# Patient Record
Sex: Female | Born: 1950
Health system: Southern US, Community
[De-identification: ages and names within clinical notes are randomized; demographics above are authoritative.]

## PROBLEM LIST (undated history)

## (undated) DIAGNOSIS — I10 Essential (primary) hypertension: Secondary | ICD-10-CM

## (undated) DIAGNOSIS — E119 Type 2 diabetes mellitus without complications: Secondary | ICD-10-CM

## (undated) DIAGNOSIS — R011 Cardiac murmur, unspecified: Secondary | ICD-10-CM

## (undated) DIAGNOSIS — J449 Chronic obstructive pulmonary disease, unspecified: Secondary | ICD-10-CM

## (undated) DIAGNOSIS — I739 Peripheral vascular disease, unspecified: Secondary | ICD-10-CM

## (undated) DIAGNOSIS — I509 Heart failure, unspecified: Secondary | ICD-10-CM

## (undated) DIAGNOSIS — I779 Disorder of arteries and arterioles, unspecified: Secondary | ICD-10-CM

## (undated) DIAGNOSIS — Z87442 Personal history of urinary calculi: Secondary | ICD-10-CM

## (undated) DIAGNOSIS — R0600 Dyspnea, unspecified: Secondary | ICD-10-CM

## (undated) DIAGNOSIS — M1990 Unspecified osteoarthritis, unspecified site: Secondary | ICD-10-CM

## (undated) HISTORY — DX: Cardiac murmur, unspecified: R01.1

## (undated) HISTORY — PX: COLONOSCOPY: SHX174

## (undated) HISTORY — DX: Disorder of arteries and arterioles, unspecified: I77.9

## (undated) HISTORY — DX: Essential (primary) hypertension: I10

## (undated) HISTORY — DX: Chronic obstructive pulmonary disease, unspecified: J44.9

## (undated) HISTORY — PX: CATARACT EXTRACTION W/ INTRAOCULAR LENS  IMPLANT, BILATERAL: SHX1307

## (undated) HISTORY — DX: Peripheral vascular disease, unspecified: I73.9

## (undated) HISTORY — DX: Heart failure, unspecified: I50.9

---

## 1989-06-16 HISTORY — PX: ABDOMINAL HYSTERECTOMY: SHX81

## 1997-06-16 HISTORY — PX: CARDIAC CATHETERIZATION: SHX172

## 2002-08-22 ENCOUNTER — Emergency Department (HOSPITAL_COMMUNITY): Admission: EM | Admit: 2002-08-22 | Discharge: 2002-08-22 | Payer: Self-pay | Admitting: Emergency Medicine

## 2002-09-30 ENCOUNTER — Encounter: Payer: Self-pay | Admitting: Orthopedic Surgery

## 2002-09-30 ENCOUNTER — Encounter: Admission: RE | Admit: 2002-09-30 | Discharge: 2002-09-30 | Payer: Self-pay | Admitting: Orthopedic Surgery

## 2005-11-25 ENCOUNTER — Ambulatory Visit: Payer: Self-pay | Admitting: Internal Medicine

## 2006-12-29 ENCOUNTER — Emergency Department: Payer: Self-pay | Admitting: Emergency Medicine

## 2006-12-29 ENCOUNTER — Other Ambulatory Visit: Payer: Self-pay

## 2007-01-09 ENCOUNTER — Ambulatory Visit: Payer: Self-pay | Admitting: Anesthesiology

## 2007-06-14 ENCOUNTER — Emergency Department: Payer: Self-pay | Admitting: Emergency Medicine

## 2007-11-29 ENCOUNTER — Emergency Department: Payer: Self-pay | Admitting: Emergency Medicine

## 2008-05-18 ENCOUNTER — Ambulatory Visit: Payer: Self-pay | Admitting: Gastroenterology

## 2009-11-07 ENCOUNTER — Ambulatory Visit: Payer: Self-pay | Admitting: Internal Medicine

## 2010-04-03 ENCOUNTER — Ambulatory Visit: Payer: Self-pay | Admitting: Vascular Surgery

## 2010-04-10 ENCOUNTER — Ambulatory Visit: Payer: Self-pay | Admitting: Internal Medicine

## 2010-04-23 ENCOUNTER — Ambulatory Visit: Payer: Self-pay | Admitting: Cardiology

## 2010-05-08 ENCOUNTER — Ambulatory Visit: Payer: Self-pay | Admitting: Vascular Surgery

## 2010-05-10 LAB — PATHOLOGY REPORT

## 2013-02-08 ENCOUNTER — Ambulatory Visit: Payer: Self-pay | Admitting: Internal Medicine

## 2013-05-16 ENCOUNTER — Ambulatory Visit (INDEPENDENT_AMBULATORY_CARE_PROVIDER_SITE_OTHER): Payer: BC Managed Care – PPO

## 2013-05-16 ENCOUNTER — Ambulatory Visit (INDEPENDENT_AMBULATORY_CARE_PROVIDER_SITE_OTHER): Payer: BC Managed Care – PPO | Admitting: Podiatry

## 2013-05-16 ENCOUNTER — Encounter: Payer: Self-pay | Admitting: Podiatry

## 2013-05-16 VITALS — BP 126/69 | HR 89 | Resp 16 | Ht 64.0 in | Wt 260.0 lb

## 2013-05-16 DIAGNOSIS — M79671 Pain in right foot: Secondary | ICD-10-CM

## 2013-05-16 DIAGNOSIS — M722 Plantar fascial fibromatosis: Secondary | ICD-10-CM

## 2013-05-16 DIAGNOSIS — M79609 Pain in unspecified limb: Secondary | ICD-10-CM

## 2013-05-16 MED ORDER — METHYLPREDNISOLONE (PAK) 4 MG PO TABS: ORAL_TABLET | ORAL | Status: DC

## 2013-05-16 MED ORDER — MELOXICAM 15 MG PO TABS: 15.0000 mg | ORAL_TABLET | Freq: Every day | ORAL | Status: DC

## 2013-05-16 NOTE — Progress Notes (Signed)
Sarah [name] presents today as a 62 year old female with a chief complaint of right heel pain x1 month slowly seems to be getting worse is particularly bad in the mornings and after sitting. She's tried over-the-counter inserts with a pair of new tennis shoes. Per Dr. Andrey Cota that she has plantar fasciitis. She's tried no other conservative therapies.  Objective: Vital signs are stable she is alert and oriented x3. We have reviewed her past medical history medications and allergies. Review of systems is unremarkable. Pulses remain strongly palpable bilateral lower extremity. Capillary fill time to digits one through 5 of the bilateral foot is immediate. Logic sensorium per Semmes-Weinstein monofilament 1 through 5 bilateral is intact. Deep tendon reflexes are brisk equal symmetrical bilateral. Muscle strength +5 over 5 dorsiflexors plantar flexors inverters and evertors are all and all intrinsic musculature is intact. Orthopedic evaluation demonstrates all joints distal to the ankle a full range of motion without crepitus she has pain on palpation medial continued tubercle of the right heel. Radiographic evaluation demonstrates all joints distal to the ankle a full range of motion without crepitus. She has pain on palpation to the medial calcaneal tubercle of the right heel consistent with plantar fasciitis.  Assessment: Plantar fasciitis right.  Plan: Injected the right heel today with Kenalog and local anesthetic. Put her in a plantar fascial strapping. A night splint. Represcribed a steroid and nonsteroidal anti-inflammatories. Discussed appropriate shoe gear stretching exercises and ice therapy. We'll followup with her in one month.

## 2013-05-16 NOTE — Progress Notes (Signed)
N PAIN L RIGHT HEEL D 79M O SLOWLY C WORSE A SHOES, AFTER SITTING, AM BAD T OTC INSERTS,

## 2013-05-16 NOTE — Patient Instructions (Signed)
Plantar Fasciitis (Heel Spur Syndrome) with Rehab The plantar fascia is a fibrous, ligament-like, soft-tissue structure that spans the bottom of the foot. Plantar fasciitis is a condition that causes pain in the foot due to inflammation of the tissue. SYMPTOMS   Pain and tenderness on the underneath side of the foot.  Pain that worsens with standing or walking. CAUSES  Plantar fasciitis is caused by irritation and injury to the plantar fascia on the underneath side of the foot. Common mechanisms of injury include:  Direct trauma to bottom of the foot.  Damage to a small nerve that runs under the foot where the main fascia attaches to the heel bone.  Stress placed on the plantar fascia due to bone spurs. RISK INCREASES WITH:   Activities that place stress on the plantar fascia (running, jumping, pivoting, or cutting).  Poor strength and flexibility.  Improperly fitted shoes.  Tight calf muscles.  Flat feet.  Failure to warm-up properly before activity.  Obesity. PREVENTION  Warm up and stretch properly before activity.  Allow for adequate recovery between workouts.  Maintain physical fitness:  Strength, flexibility, and endurance.  Cardiovascular fitness.  Maintain a health body weight.  Avoid stress on the plantar fascia.  Wear properly fitted shoes, including arch supports for individuals who have flat feet. PROGNOSIS  If treated properly, then the symptoms of plantar fasciitis usually resolve without surgery. However, occasionally surgery is necessary. RELATED COMPLICATIONS   Recurrent symptoms that may result in a chronic condition.  Problems of the lower back that are caused by compensating for the injury, such as limping.  Pain or weakness of the foot during push-off following surgery.  Chronic inflammation, scarring, and partial or complete fascia tear, occurring more often from repeated injections. TREATMENT  Treatment initially involves the use of  ice and medication to help reduce pain and inflammation. The use of strengthening and stretching exercises may help reduce pain with activity, especially stretches of the Achilles tendon. These exercises may be performed at home or with a therapist. Your caregiver may recommend that you use heel cups of arch supports to help reduce stress on the plantar fascia. Occasionally, corticosteroid injections are given to reduce inflammation. If symptoms persist for greater than 6 months despite non-surgical (conservative), then surgery may be recommended.  MEDICATION   If pain medication is necessary, then nonsteroidal anti-inflammatory medications, such as aspirin and ibuprofen, or other minor pain relievers, such as acetaminophen, are often recommended.  Do not take pain medication within 7 days before surgery.  Prescription pain relievers may be given if deemed necessary by your caregiver. Use only as directed and only as much as you need.  Corticosteroid injections may be given by your caregiver. These injections should be reserved for the most serious cases, because they may only be given a certain number of times. HEAT AND COLD  Cold treatment (icing) relieves pain and reduces inflammation. Cold treatment should be applied for 10 to 15 minutes every 2 to 3 hours for inflammation and pain and immediately after any activity that aggravates your symptoms. Use ice packs or massage the area with a piece of ice (ice massage).  Heat treatment may be used prior to performing the stretching and strengthening activities prescribed by your caregiver, physical therapist, or athletic trainer. Use a heat pack or soak the injury in warm water. SEEK IMMEDIATE MEDICAL CARE IF:  Treatment seems to offer no benefit, or the condition worsens.  Any medications produce adverse side effects. EXERCISES RANGE   OF MOTION (ROM) AND STRETCHING EXERCISES - Plantar Fasciitis (Heel Spur Syndrome) These exercises may help you  when beginning to rehabilitate your injury. Your symptoms may resolve with or without further involvement from your physician, physical therapist or athletic trainer. While completing these exercises, remember:   Restoring tissue flexibility helps normal motion to return to the joints. This allows healthier, less painful movement and activity.  An effective stretch should be held for at least 30 seconds.  A stretch should never be painful. You should only feel a gentle lengthening or release in the stretched tissue. RANGE OF MOTION - Toe Extension, Flexion  Sit with your right / left leg crossed over your opposite knee.  Grasp your toes and gently pull them back toward the top of your foot. You should feel a stretch on the bottom of your toes and/or foot.  Hold this stretch for __________ seconds.  Now, gently pull your toes toward the bottom of your foot. You should feel a stretch on the top of your toes and or foot.  Hold this stretch for __________ seconds. Repeat __________ times. Complete this stretch __________ times per day.  RANGE OF MOTION - Ankle Dorsiflexion, Active Assisted  Remove shoes and sit on a chair that is preferably not on a carpeted surface.  Place right / left foot under knee. Extend your opposite leg for support.  Keeping your heel down, slide your right / left foot back toward the chair until you feel a stretch at your ankle or calf. If you do not feel a stretch, slide your bottom forward to the edge of the chair, while still keeping your heel down.  Hold this stretch for __________ seconds. Repeat __________ times. Complete this stretch __________ times per day.  STRETCH  Gastroc, Standing  Place hands on wall.  Extend right / left leg, keeping the front knee somewhat bent.  Slightly point your toes inward on your back foot.  Keeping your right / left heel on the floor and your knee straight, shift your weight toward the wall, not allowing your back to  arch.  You should feel a gentle stretch in the right / left calf. Hold this position for __________ seconds. Repeat __________ times. Complete this stretch __________ times per day. STRETCH  Soleus, Standing  Place hands on wall.  Extend right / left leg, keeping the other knee somewhat bent.  Slightly point your toes inward on your back foot.  Keep your right / left heel on the floor, bend your back knee, and slightly shift your weight over the back leg so that you feel a gentle stretch deep in your back calf.  Hold this position for __________ seconds. Repeat __________ times. Complete this stretch __________ times per day. STRETCH  Gastrocsoleus, Standing  Note: This exercise can place a lot of stress on your foot and ankle. Please complete this exercise only if specifically instructed by your caregiver.   Place the ball of your right / left foot on a step, keeping your other foot firmly on the same step.  Hold on to the wall or a rail for balance.  Slowly lift your other foot, allowing your body weight to press your heel down over the edge of the step.  You should feel a stretch in your right / left calf.  Hold this position for __________ seconds.  Repeat this exercise with a slight bend in your right / left knee. Repeat __________ times. Complete this stretch __________ times per day.    STRENGTHENING EXERCISES - Plantar Fasciitis (Heel Spur Syndrome)  These exercises may help you when beginning to rehabilitate your injury. They may resolve your symptoms with or without further involvement from your physician, physical therapist or athletic trainer. While completing these exercises, remember:   Muscles can gain both the endurance and the strength needed for everyday activities through controlled exercises.  Complete these exercises as instructed by your physician, physical therapist or athletic trainer. Progress the resistance and repetitions only as guided. STRENGTH - Towel  Curls  Sit in a chair positioned on a non-carpeted surface.  Place your foot on a towel, keeping your heel on the floor.  Pull the towel toward your heel by only curling your toes. Keep your heel on the floor.  If instructed by your physician, physical therapist or athletic trainer, add ____________________ at the end of the towel. Repeat __________ times. Complete this exercise __________ times per day. STRENGTH - Ankle Inversion  Secure one end of a rubber exercise band/tubing to a fixed object (table, pole). Loop the other end around your foot just before your toes.  Place your fists between your knees. This will focus your strengthening at your ankle.  Slowly, pull your big toe up and in, making sure the band/tubing is positioned to resist the entire motion.  Hold this position for __________ seconds.  Have your muscles resist the band/tubing as it slowly pulls your foot back to the starting position. Repeat __________ times. Complete this exercises __________ times per day.  Document Released: 06/02/2005 Document Revised: 08/25/2011 Document Reviewed: 09/14/2008 [employer] Patient Information 2014 [employer]. Plantar Fasciitis Plantar fasciitis is a common condition that causes foot pain. It is soreness (inflammation) of the band of tough fibrous tissue on the bottom of the foot that runs from the heel bone (calcaneus) to the ball of the foot. The cause of this soreness may be from excessive standing, poor fitting shoes, running on hard surfaces, being overweight, having an abnormal walk, or overuse (this is common in runners) of the painful foot or feet. It is also common in aerobic exercise dancers and ballet dancers. SYMPTOMS  Most people with plantar fasciitis complain of:  Severe pain in the morning on the bottom of their foot especially when taking the first steps out of bed. This pain recedes after a few minutes of walking.  Severe pain is experienced also during walking  following a long period of inactivity.  Pain is worse when walking barefoot or up stairs DIAGNOSIS   Your caregiver will diagnose this condition by examining and feeling your foot.  Special tests such as X-rays of your foot, are usually not needed. PREVENTION   Consult a sports medicine professional before beginning a new exercise program.  Walking programs offer a good workout. With walking there is a lower chance of overuse injuries common to runners. There is less impact and less jarring of the joints.  Begin all new exercise programs slowly. If problems or pain develop, decrease the amount of time or distance until you are at a comfortable level.  Wear good shoes and replace them regularly.  Stretch your foot and the heel cords at the back of the ankle (Achilles tendon) both before and after exercise.  Run or exercise on even surfaces that are not hard. For example, asphalt is better than pavement.  Do not run barefoot on hard surfaces.  If using a treadmill, vary the incline.  Do not continue to workout if you have foot or joint   problems. Seek professional help if they do not improve. HOME CARE INSTRUCTIONS   Avoid activities that cause you pain until you recover.  Use ice or cold packs on the problem or painful areas after working out.  Only take over-the-counter or prescription medicines for pain, discomfort, or fever as directed by your caregiver.  Soft shoe inserts or athletic shoes with air or gel sole cushions may be helpful.  If problems continue or become more severe, consult a sports medicine caregiver or your own health care provider. Cortisone is a potent anti-inflammatory medication that may be injected into the painful area. You can discuss this treatment with your caregiver. MAKE SURE YOU:   Understand these instructions.  Will watch your condition.  Will get help right away if you are not doing well or get worse. Document Released: 02/25/2001 Document  Revised: 08/25/2011 Document Reviewed: 04/26/2008 [employer] Patient Information 2014 [employer].  

## 2013-06-13 ENCOUNTER — Ambulatory Visit: Payer: BC Managed Care – PPO | Admitting: Podiatry

## 2013-06-20 ENCOUNTER — Ambulatory Visit: Payer: Self-pay | Admitting: Podiatry

## 2013-09-09 ENCOUNTER — Other Ambulatory Visit: Payer: Self-pay | Admitting: *Deleted

## 2013-09-09 MED ORDER — MELOXICAM 15 MG PO TABS: 15.0000 mg | ORAL_TABLET | Freq: Every day | ORAL | Status: DC

## 2013-09-09 NOTE — Telephone Encounter (Signed)
REFILL REQUEST FOR MOBIC.

## 2014-02-15 ENCOUNTER — Other Ambulatory Visit: Payer: Self-pay | Admitting: *Deleted

## 2014-02-15 MED ORDER — MELOXICAM 15 MG PO TABS: 15.0000 mg | ORAL_TABLET | Freq: Every day | ORAL | Status: DC

## 2014-02-15 NOTE — Telephone Encounter (Signed)
[facility] sent fax refill request for meloxicam 15 mg #30 take one by mouth once daily. Per dr Al Corpus refill with 3 refills.

## 2014-03-14 ENCOUNTER — Other Ambulatory Visit (HOSPITAL_COMMUNITY): Payer: Self-pay | Admitting: *Deleted

## 2014-03-14 DIAGNOSIS — I509 Heart failure, unspecified: Secondary | ICD-10-CM

## 2014-03-28 ENCOUNTER — Other Ambulatory Visit: Payer: Self-pay

## 2014-03-28 ENCOUNTER — Other Ambulatory Visit (INDEPENDENT_AMBULATORY_CARE_PROVIDER_SITE_OTHER): Payer: BC Managed Care – PPO

## 2014-03-28 DIAGNOSIS — I509 Heart failure, unspecified: Secondary | ICD-10-CM

## 2014-03-28 DIAGNOSIS — R579 Shock, unspecified: Secondary | ICD-10-CM

## 2014-03-28 DIAGNOSIS — R601 Generalized edema: Secondary | ICD-10-CM

## 2014-05-01 ENCOUNTER — Ambulatory Visit: Payer: Self-pay | Admitting: Internal Medicine

## 2014-05-04 LAB — PULMONARY FUNCTION TEST

## 2014-06-05 ENCOUNTER — Encounter: Payer: Self-pay | Admitting: Internal Medicine

## 2014-06-05 ENCOUNTER — Ambulatory Visit (INDEPENDENT_AMBULATORY_CARE_PROVIDER_SITE_OTHER): Payer: BC Managed Care – PPO | Admitting: Internal Medicine

## 2014-06-05 ENCOUNTER — Telehealth: Payer: Self-pay | Admitting: *Deleted

## 2014-06-05 VITALS — BP 140/88 | HR 75 | Temp 97.8°F | Ht 64.5 in | Wt 268.0 lb

## 2014-06-05 DIAGNOSIS — R0600 Dyspnea, unspecified: Secondary | ICD-10-CM | POA: Insufficient documentation

## 2014-06-05 DIAGNOSIS — R0609 Other forms of dyspnea: Secondary | ICD-10-CM

## 2014-06-05 DIAGNOSIS — J439 Emphysema, unspecified: Secondary | ICD-10-CM

## 2014-06-05 DIAGNOSIS — E669 Obesity, unspecified: Secondary | ICD-10-CM

## 2014-06-05 MED ORDER — TIOTROPIUM BROMIDE MONOHYDRATE 18 MCG IN CAPS: 18.0000 ug | ORAL_CAPSULE | Freq: Every day | RESPIRATORY_TRACT | Status: DC

## 2014-06-05 NOTE — Telephone Encounter (Signed)
Patient called back to see if Albuterol inhaler was to be called in as well. She picked up the Spiriva inhaler.

## 2014-06-05 NOTE — Progress Notes (Signed)
Date: 06/05/2014  MRN# [id number] Sarah [name] Feb 27, 1951  Referring Physician:   MAYUMI [name] is a 63 y.o. old female seen in consultation for shortness of breath  CC:  Chief Complaint  Patient presents with  . Advice Only    pt c/o sob with exhertion. She says it has gotten a little better. Pt denies cough,wheeze and chest tightness.    HPI:  63 yo female with PMHx HTN, HLD, referred by Dr. [name] for evaluation of dyspnea on exertion.  Patient is accompanied today with her longtime boyfriend History of her DOE is as follows - noticed mostly with sexual activity  - can climb a flight of stair, but gets sob short afterwards - lifting heavy object may cause some sob - associated with mild intermittent cough (non productive).  - has been slowly present for about 2 years but more noticeable over the past 3 months.  Patient has a hx of smoking, 0.5 ppd x 27years, but quit in 1997.  She was diagnosed with mild diastolic congestive heart failure in 1999 and intermittently followed with Dr. [name] for this.  Her heart failure remained stable, and her primary care physician has been following her along for this. Given the sob\doe, her PMD obtained pulmonary function testing and then referred her to Pulmonary for further evaluation.  She has noticed ankle swelling mainly in the right foot over the past couple of weeks, but currently on lasix, has a history right plantar fascitis, and some ch9ornic right swelling since she was diagnosed with CHF.  She denies ever being intubated for any respiratory, no history of bronchitis or requiring steroid\antibiotics for upper respiratory tract infection in the last 2-3 years.  She denies any other respiratory illness.  Patient did have pulmonary function testing and would like to review the results today.    PMHX:   Past Medical History  Diagnosis Date  . Congestive heart failure   . Carotid artery disease   . HBP (high blood pressure)   .  Heart murmur    Surgical Hx:  Past Surgical History  Procedure Laterality Date  . Abdominal hysterectomy  1991   Family Hx:  Family History  Problem Relation Age of Onset  . Stroke Father   . Hypertension Father   . Hypertension Mother   . Stroke Mother    Social Hx:   History  Substance Use Topics  . Smoking status: Former Smoker -- 0.50 packs/day for 27 years    Types: Cigarettes    Quit date: 01/15/1996  . Smokeless tobacco: Never Used  . Alcohol Use: 0.0 oz/week    0 Not specified per week     Comment: WINE WEEKLY   Medication:   Current Outpatient Rx  Name  Route  Sig  Dispense  Refill  . acetaminophen (TYLENOL) 500 MG tablet   Oral   Take 500 mg by mouth as needed.         Marland Kitchen aspirin 81 MG tablet   Oral   Take 81 mg by mouth daily.         . carvedilol (COREG) 12.5 MG tablet   Oral   Take 12.5 mg by mouth 2 (two) times daily with a meal.         . cyclobenzaprine (FLEXERIL) 10 MG tablet   Oral   Take 10 mg by mouth at bedtime.         . furosemide (LASIX) 20 MG tablet   Oral   Take  20 mg by mouth daily.         Marland Kitchen. ibuprofen (ADVIL,MOTRIN) 800 MG tablet   Oral   Take 800 mg by mouth as needed.         Marland Kitchen. losartan (COZAAR) 50 MG tablet   Oral   Take 50 mg by mouth daily.         . Multiple Vitamins-Minerals (CENTRUM PO)   Oral   Take by mouth daily.         Marland Kitchen. omeprazole (PRILOSEC) 20 MG capsule   Oral   Take 20 mg by mouth 2 (two) times daily before a meal.         . simvastatin (ZOCOR) 40 MG tablet   Oral   Take 40 mg by mouth daily.         Marland Kitchen. albuterol (PROVENTIL HFA;VENTOLIN HFA) 108 (90 BASE) MCG/ACT inhaler      2 puffs every 2-4 hours prn   1 Inhaler   2   . tiotropium (SPIRIVA) 18 MCG inhalation capsule   Inhalation   Place 1 capsule (18 mcg total) into inhaler and inhale daily.   30 capsule   5       Allergies:  Review of patient's allergies indicates no known allergies.  Review of Systems: Gen:   Denies  fever, sweats, chills HEENT: Denies blurred vision, double vision, ear pain, eye pain, hearing loss, nose bleeds, sore throat Cvc:  No dizziness, chest pain or heaviness Resp:   Denies cough or sputum porduction, shortness of breath Gi: Denies swallowing difficulty, stomach pain, nausea or vomiting, diarrhea, constipation, bowel incontinence Gu:  Denies bladder incontinence, burning urine Ext:   No Joint pain.  Admits to mild right foot swelling, this is chronic Skin: No skin rash, easy bruising or bleeding or hives Endoc:  No polyuria, polydipsia , polyphagia or weight change Psych: No depression, insomnia or hallucinations  Other:  All other systems negative  Physical Examination:   VS: BP 140/88 mmHg  Pulse 75  Temp(Src) 97.8 F (36.6 C) (Oral)  Ht 5' 4.5" (1.638 m)  Wt 268 lb (121.564 kg)  BMI 45.31 kg/m2  SpO2 98%  General Appearance: No distress  Neuro:without focal findings, mental status, speech normal, alert and oriented, cranial nerves 2-12 intact, reflexes normal and symmetric, sensation grossly normal  HEENT: PERRLA, EOM intact, no ptosis, no other lesions noticed; Mallampati 2 Pulmonary: good airway entry, mild dec BS at the bases, no wheezes\ronchi.  Sputum Production:  none CardiovascularNormal S1,S2.  No m/r/g.  Abdominal aorta pulsation normal.    Abdomen: Benign, Soft, non-tender, No masses, hepatosplenomegaly, No lymphadenopathy Renal:  No costovertebral tenderness  GU:  No performed at this time. Endoc: No evident thyromegaly, no signs of acromegaly or Cushing features Skin:   warm, no rashes, no ecchymosis  Extremities: normal, no cyanosis, clubbing,  warm with normal capillary refill. Other findings: mild trace ankle edema   Labs results:   Rad results:  (The following images and results were reviewed by Dr. Dema SeverinMungal).   ECHO 03/2014 Study Conclusions - Left ventricle: The cavity size was normal. There was mild concentric hypertrophy. Systolic  function was vigorous. The estimated ejection fraction was in the range of 65% to 70%. Wall motion was normal; there were no regional wall motion abnormalities. Doppler parameters are consistent with abnormal left ventricular relaxation (grade 1 diastolic dysfunction). - Left atrium: The atrium was mildly dilated. - Pulmonary arteries: Systolic pressure was within the normal range.  PFTs 04/2014  Spirometry: FVC: 2.27L 78%predicted FEV1: 1.44L 68%predicted FEV1/FVC: 64%predicted.   Diffusion: 57%predicted.   Lung Volumes: VC: 2.32L 80%predicted RV: 1.62L 91%predicted TLC: 3.94L 84%predicted.   Volume/Flow Loops: scooping of expiratory limb.   Comments: Spirometric Data is Acceptable and Reproducible Fev1/Fvc ratio and DLCO reduced.   Assessment/Impression: Moderate Obstructive Airways disease with Diffusion impairment  Clinical Correlation Advised.    Assessment and Plan: DOE (dyspnea on exertion) Multifactorial - dCHF, COPD, Obestiy  Plan - cont with CHF optimization, follow up with cardiology - plan to initiate spiriva - wt. Loss and proper diet education given to patient. \ - OSA Screen at next visit  COPD type A COPD Type A Level of obstruction - Moderate Symptoms - mild  Plan - given her mild symptoms (DOE), will plan for Spiriva and prn albuterol - patient educated on use and administration of inhaler, along with the side effects - if no improvement or worsening symptoms, will plan to add Advair - CAT, mMRC at next visit.   Obesity OBESITY  Wt: 268 lbs BMI: 45 Discussed importance of weight reduction.  Educated regarding limitation of  intake of greasy/fried foods.  Instructed on benefit of  a low-impact exercise program, starting slowly.  Discussed benefits of 30-45 minutes of some form of exercise daily as well as benefit of supervised exercise program.       Updated Medication List Outpatient Encounter Prescriptions as of 06/05/2014   Medication Sig  . acetaminophen (TYLENOL) 500 MG tablet Take 500 mg by mouth as needed.  Marland Kitchen. aspirin 81 MG tablet Take 81 mg by mouth daily.  . carvedilol (COREG) 12.5 MG tablet Take 12.5 mg by mouth 2 (two) times daily with a meal.  . cyclobenzaprine (FLEXERIL) 10 MG tablet Take 10 mg by mouth at bedtime.  . furosemide (LASIX) 20 MG tablet Take 20 mg by mouth daily.  Marland Kitchen. ibuprofen (ADVIL,MOTRIN) 800 MG tablet Take 800 mg by mouth as needed.  Marland Kitchen. losartan (COZAAR) 50 MG tablet Take 50 mg by mouth daily.  . Multiple Vitamins-Minerals (CENTRUM PO) Take by mouth daily.  Marland Kitchen. omeprazole (PRILOSEC) 20 MG capsule Take 20 mg by mouth 2 (two) times daily before a meal.  . simvastatin (ZOCOR) 40 MG tablet Take 40 mg by mouth daily.  . [DISCONTINUED] meloxicam (MOBIC) 15 MG tablet Take 1 tablet (15 mg total) by mouth daily. (Patient not taking: Reported on 06/05/2014)  . [DISCONTINUED] methylPREDNIsolone (MEDROL DOSPACK) 4 MG tablet follow package directions (Patient not taking: Reported on 06/05/2014)    Orders for this visit: No orders of the defined types were placed in this encounter.     Thank  you for the consultation and for allowing [facility] to assist in the care of your patient. Our recommendations are noted above.  Please contact us if we can be of further service.   Stephanie AcreVishal Legacy Carrender, MD [facility] Office Number: [phone]

## 2014-06-05 NOTE — Patient Instructions (Signed)
Spiriva will be sent to your pharmacy. Follow up with Dr. Dema SeverinMungal in 3 months.

## 2014-06-05 NOTE — Assessment & Plan Note (Addendum)
Multifactorial - dCHF, COPD, Obestiy  Plan - cont with CHF optimization, follow up with cardiology - plan to initiate spiriva - wt. Loss and proper diet education given to patient. \ - OSA Screen at next visit

## 2014-06-06 ENCOUNTER — Telehealth: Payer: Self-pay | Admitting: Internal Medicine

## 2014-06-06 ENCOUNTER — Other Ambulatory Visit: Payer: Self-pay | Admitting: Internal Medicine

## 2014-06-06 MED ORDER — ALBUTEROL SULFATE HFA 108 (90 BASE) MCG/ACT IN AERS: INHALATION_SPRAY | RESPIRATORY_TRACT | Status: DC

## 2014-06-06 NOTE — Assessment & Plan Note (Signed)
OBESITY  Wt: 268 lbs BMI: 45 Discussed importance of weight reduction.  Educated regarding limitation of  intake of greasy/fried foods.  Instructed on benefit of  a low-impact exercise program, starting slowly.  Discussed benefits of 30-45 minutes of some form of exercise daily as well as benefit of supervised exercise program.

## 2014-06-06 NOTE — Assessment & Plan Note (Signed)
COPD Type A Level of obstruction - Moderate Symptoms - mild  Plan - given her mild symptoms (DOE), will plan for Spiriva and prn albuterol - patient educated on use and administration of inhaler, along with the side effects - if no improvement or worsening symptoms, will plan to add Advair - CAT, mMRC at next visit.

## 2014-06-06 NOTE — Telephone Encounter (Signed)
She does need a rescue inhaler also. Please inform patient and call in an rx Alburterol 2puff q2-4hrs PRN sob\wheezing  Thank you

## 2014-06-06 NOTE — Telephone Encounter (Signed)
Spoke with pharmacy and gave ok to change Ventolin to ProAir.

## 2014-07-17 ENCOUNTER — Other Ambulatory Visit: Payer: Self-pay | Admitting: Radiology

## 2014-07-17 DIAGNOSIS — R0602 Shortness of breath: Secondary | ICD-10-CM

## 2014-08-01 ENCOUNTER — Encounter: Payer: Self-pay | Admitting: *Deleted

## 2014-08-01 ENCOUNTER — Other Ambulatory Visit: Payer: BLUE CROSS/BLUE SHIELD

## 2014-08-01 DIAGNOSIS — R0989 Other specified symptoms and signs involving the circulatory and respiratory systems: Secondary | ICD-10-CM

## 2014-08-07 ENCOUNTER — Ambulatory Visit: Payer: Self-pay | Admitting: Internal Medicine

## 2014-08-18 ENCOUNTER — Ambulatory Visit: Payer: Self-pay | Admitting: Gastroenterology

## 2014-08-28 ENCOUNTER — Other Ambulatory Visit (INDEPENDENT_AMBULATORY_CARE_PROVIDER_SITE_OTHER): Payer: BLUE CROSS/BLUE SHIELD

## 2014-08-28 ENCOUNTER — Other Ambulatory Visit: Payer: Self-pay

## 2014-08-28 DIAGNOSIS — R0602 Shortness of breath: Secondary | ICD-10-CM

## 2014-08-28 DIAGNOSIS — R0600 Dyspnea, unspecified: Secondary | ICD-10-CM

## 2014-10-06 ENCOUNTER — Ambulatory Visit
Admit: 2014-10-06 | Disposition: A | Discharge status: Home / Self Care | Payer: Self-pay | Attending: Internal Medicine | Admitting: Internal Medicine

## 2014-10-10 ENCOUNTER — Ambulatory Visit: Payer: BLUE CROSS/BLUE SHIELD | Admitting: Internal Medicine

## 2014-11-01 ENCOUNTER — Ambulatory Visit (INDEPENDENT_AMBULATORY_CARE_PROVIDER_SITE_OTHER): Payer: BLUE CROSS/BLUE SHIELD | Admitting: Internal Medicine

## 2014-11-01 ENCOUNTER — Encounter: Payer: Self-pay | Admitting: Internal Medicine

## 2014-11-01 VITALS — BP 108/68 | HR 80 | Temp 97.8°F | Ht 64.5 in | Wt 267.0 lb

## 2014-11-01 DIAGNOSIS — J439 Emphysema, unspecified: Secondary | ICD-10-CM | POA: Diagnosis not present

## 2014-11-01 DIAGNOSIS — R0600 Dyspnea, unspecified: Secondary | ICD-10-CM

## 2014-11-01 DIAGNOSIS — E669 Obesity, unspecified: Secondary | ICD-10-CM

## 2014-11-01 DIAGNOSIS — R0609 Other forms of dyspnea: Secondary | ICD-10-CM | POA: Diagnosis not present

## 2014-11-01 NOTE — Progress Notes (Signed)
MRN# [id number] Sarah [name] Nov 25, 1950   CC: Chief Complaint  Patient presents with  . Follow-up    Pt here to f/u PH and dyspnea      Brief History: HPI 05/2014 64 yo female with PMHx HTN, HLD, referred by Dr. [name] for evaluation of dyspnea on exertion. Patient is accompanied today with her longtime boyfriend History of her DOE is as follows - noticed mostly with sexual activity  - can climb a flight of stair, but gets sob short afterwards - lifting heavy object may cause some sob - associated with mild intermittent cough (non productive).  - has been slowly present for about 2 years but more noticeable over the past 3 months.  Patient has a hx of smoking, 0.5 ppd x 27years, but quit in 1997. She was diagnosed with mild diastolic congestive heart failure in 1999 and intermittently followed with Dr. [name] for this. Her heart failure remained stable, and her primary care physician has been following her along for this. Given the sob\doe, her PMD obtained pulmonary function testing and then referred her to Pulmonary for further evaluation.  She has noticed ankle swelling mainly in the right foot over the past couple of weeks, but currently on lasix, has a history right plantar fascitis, and some ch9ornic right swelling since she was diagnosed with CHF.  She denies ever being intubated for any respiratory, no history of bronchitis or requiring steroid\antibiotics for upper respiratory tract infection in the last 2-3 years. She denies any other respiratory illness.  Patient did have pulmonary function testing and would like to review the results today.  Plan - wt loss, spiriva, chf optimization  Events since last clinic visit: Presents today for a follow up visit.  Currently doing well since starting spiriva. Can now walk further, started exercising, walking about 1 mile a day on the track. Performing treadmill exercises in the AM and 15 mins in the PM. DOE has  significantly improved.   No recent URI, no recent ED\Urgent care visits.     Medication:   Current Outpatient Rx  Name  Route  Sig  Dispense  Refill  . acetaminophen (TYLENOL) 500 MG tablet   Oral   Take 500 mg by mouth as needed.         Marland Kitchen albuterol (PROVENTIL HFA;VENTOLIN HFA) 108 (90 BASE) MCG/ACT inhaler      2 puffs every 2-4 hours prn   1 Inhaler   2   . aspirin 81 MG tablet   Oral   Take 81 mg by mouth daily.         . carvedilol (COREG) 12.5 MG tablet   Oral   Take 12.5 mg by mouth 2 (two) times daily with a meal.         . cyclobenzaprine (FLEXERIL) 10 MG tablet   Oral   Take 10 mg by mouth at bedtime.         . furosemide (LASIX) 20 MG tablet   Oral   Take 20 mg by mouth daily.         Marland Kitchen ibuprofen (ADVIL,MOTRIN) 800 MG tablet   Oral   Take 800 mg by mouth as needed.         Marland Kitchen losartan (COZAAR) 50 MG tablet   Oral   Take 50 mg by mouth daily.         . Multiple Vitamins-Minerals (CENTRUM PO)   Oral   Take by mouth daily.         Marland Kitchen  naproxen (NAPROSYN) 375 MG tablet   Oral   Take 375 mg by mouth 3 (three) times daily as needed.      2   . omeprazole (PRILOSEC) 20 MG capsule   Oral   Take 20 mg by mouth daily.          . simvastatin (ZOCOR) 40 MG tablet   Oral   Take 40 mg by mouth daily.         Marland Kitchen. tiotropium (SPIRIVA) 18 MCG inhalation capsule   Inhalation   Place 1 capsule (18 mcg total) into inhaler and inhale daily.   30 capsule   5      Review of Systems: Gen:  Denies  fever, sweats, chills HEENT: Denies blurred vision, double vision, ear pain, eye pain, hearing loss, nose bleeds, sore throat Cvc:  No dizziness, chest pain or heaviness Resp:   Admits to: Gi: Denies swallowing difficulty, stomach pain, nausea or vomiting, diarrhea, constipation, bowel incontinence Gu:  Denies bladder incontinence, burning urine Ext:   No Joint pain, stiffness or swelling Skin: No skin rash, easy bruising or bleeding or  hives Endoc:  No polyuria, polydipsia , polyphagia or weight change Other:  All other systems negative  Allergies:  Review of patient's allergies indicates no known allergies.  Physical Examination:  VS: BP 108/68 mmHg  Pulse 80  Temp(Src) 97.8 F (36.6 C) (Oral)  Ht 5' 4.5" (1.638 m)  Wt 267 lb (121.11 kg)  BMI 45.14 kg/m2  SpO2 96%  General Appearance: No distress  HEENT: PERRLA, no ptosis, no other lesions noticed Pulmonary:normal breath sounds., diaphragmatic excursion normal.No wheezing, No rales   Cardiovascular:  Normal S1,S2.  No m/r/g.     Abdomen:Exam: Benign, Soft, non-tender, No masses  Skin:   warm, no rashes, no ecchymosis  Extremities: normal, no cyanosis, clubbing, warm with normal capillary refill.     Assessment and Plan: COPD type A COPD Type A Level of obstruction - Moderate Symptoms - mild  Plan - continue with Spiriva  - patient educated on use and administration of inhaler, along with the side effects - if no improvement or worsening symptoms, will plan to add Advair     DOE (dyspnea on exertion) Multifactorial - dCHF, COPD, Obestiy, denconditioning  Plan - cont with CHF optimization, follow up with cardiology - Continue with Spiriva - wt. Loss and proper diet education given to patient.  - continue with exercise - OSA Screen at next visit     Obesity OBESITY  Wt: 267 lbs BMI: 45 Discussed importance of weight reduction.  Educated regarding limitation of  intake of greasy/fried foods.  Instructed on benefit of  a low-impact exercise program, starting slowly.  Discussed benefits of 30-45 minutes of some form of exercise daily as well as benefit of supervised exercise program.          Updated Medication List Outpatient Encounter Prescriptions as of 11/01/2014  Medication Sig  . acetaminophen (TYLENOL) 500 MG tablet Take 500 mg by mouth as needed.  Marland Kitchen. albuterol (PROVENTIL HFA;VENTOLIN HFA) 108 (90 BASE) MCG/ACT inhaler 2 puffs  every 2-4 hours prn  . aspirin 81 MG tablet Take 81 mg by mouth daily.  . carvedilol (COREG) 12.5 MG tablet Take 12.5 mg by mouth 2 (two) times daily with a meal.  . cyclobenzaprine (FLEXERIL) 10 MG tablet Take 10 mg by mouth at bedtime.  . furosemide (LASIX) 20 MG tablet Take 20 mg by mouth daily.  Marland Kitchen. ibuprofen (ADVIL,MOTRIN) 800 MG  tablet Take 800 mg by mouth as needed.  Marland Kitchen. losartan (COZAAR) 50 MG tablet Take 50 mg by mouth daily.  . Multiple Vitamins-Minerals (CENTRUM PO) Take by mouth daily.  . naproxen (NAPROSYN) 375 MG tablet Take 375 mg by mouth 3 (three) times daily as needed.  Marland Kitchen. omeprazole (PRILOSEC) 20 MG capsule Take 20 mg by mouth daily.   . simvastatin (ZOCOR) 40 MG tablet Take 40 mg by mouth daily.  Marland Kitchen. tiotropium (SPIRIVA) 18 MCG inhalation capsule Place 1 capsule (18 mcg total) into inhaler and inhale daily.   No facility-administered encounter medications on file as of 11/01/2014.    Orders for this visit: No orders of the defined types were placed in this encounter.    Thank  you for the visitation and for allowing  [facility] to assist in the care of your patient. Our recommendations are noted above.  Please contact us if we can be of further service.  Stephanie AcreVishal Santo Zahradnik, MD [facility] Office Number: [phone]

## 2014-11-01 NOTE — Assessment & Plan Note (Addendum)
Multifactorial - dCHF, COPD, Obestiy, denconditioning  Plan - cont with CHF optimization, follow up with cardiology - Continue with Spiriva - wt. Loss and proper diet education given to patient.  - continue with exercise - OSA Screen at next visit

## 2014-11-01 NOTE — Patient Instructions (Signed)
Follow up with Dr. Dema SeverinMungal in 6 months - cont with Spiriva  - cont with Diet, exercise and weight loss.

## 2014-11-01 NOTE — Assessment & Plan Note (Signed)
COPD Type A Level of obstruction - Moderate Symptoms - mild  Plan - continue with Spiriva  - patient educated on use and administration of inhaler, along with the side effects - if no improvement or worsening symptoms, will plan to add Advair      

## 2014-11-01 NOTE — Assessment & Plan Note (Addendum)
OBESITY  Wt: 267 lbs BMI: 45 Discussed importance of weight reduction.  Educated regarding limitation of  intake of greasy/fried foods.  Instructed on benefit of  a low-impact exercise program, starting slowly.  Discussed benefits of 30-45 minutes of some form of exercise daily as well as benefit of supervised exercise program.

## 2014-12-14 ENCOUNTER — Ambulatory Visit (INDEPENDENT_AMBULATORY_CARE_PROVIDER_SITE_OTHER): Payer: BLUE CROSS/BLUE SHIELD | Admitting: General Surgery

## 2014-12-14 ENCOUNTER — Encounter: Payer: Self-pay | Admitting: General Surgery

## 2014-12-14 VITALS — BP 130/73 | HR 76 | Temp 98.7°F | Resp 12 | Ht 63.0 in | Wt 263.0 lb

## 2014-12-14 DIAGNOSIS — L0231 Cutaneous abscess of buttock: Secondary | ICD-10-CM

## 2014-12-14 NOTE — Progress Notes (Signed)
Patient ID: Sarah [name], female   DOB: [birth year], 64 y.o.   MRN: [id number]  Chief Complaint  Patient presents with  . Other    buttock abscess    HPI Sarah [name] is a 64 y.o. female here today for a evaluation of a left buttock abscess. Patient states she noticed the area about one week ago. The area is red and swollen, she states the area has started draining some in the last three days.  Patient reports that she's been feeling poorly last by 4 hours with some nausea. No fever or chills reported. HPI  Past Medical History  Diagnosis Date  . Congestive heart failure   . Carotid artery disease   . HBP (high blood pressure)   . Heart murmur   . COPD (chronic obstructive pulmonary disease)     Past Surgical History  Procedure Laterality Date  . Abdominal hysterectomy  1991  . Colonoscopy    . Cardiac catheterization  1999    Family History  Problem Relation Age of Onset  . Stroke Father   . Hypertension Father   . Hypertension Mother   . Stroke Mother     Social History History  Substance Use Topics  . Smoking status: Former Smoker -- 0.50 packs/day for 27 years    Types: Cigarettes    Quit date: 01/15/1996  . Smokeless tobacco: Never Used  . Alcohol Use: 0.0 oz/week    0 Standard drinks or equivalent per week     Comment: WINE WEEKLY    No Known Allergies  Current Outpatient Prescriptions  Medication Sig Dispense Refill  . acetaminophen (TYLENOL) 500 MG tablet Take 500 mg by mouth as needed.    Marland Kitchen. albuterol (PROVENTIL HFA;VENTOLIN HFA) 108 (90 BASE) MCG/ACT inhaler 2 puffs every 2-4 hours prn 1 Inhaler 2  . aspirin 81 MG tablet Take 81 mg by mouth daily.    . carvedilol (COREG) 12.5 MG tablet Take 12.5 mg by mouth 2 (two) times daily with a meal.    . furosemide (LASIX) 20 MG tablet Take 20 mg by mouth daily.    Marland Kitchen. ibuprofen (ADVIL,MOTRIN) 800 MG tablet Take 800 mg by mouth as needed.    Marland Kitchen. losartan (COZAAR) 50 MG tablet Take 50 mg by mouth daily.    .  metFORMIN (GLUCOPHAGE) 500 MG tablet   0  . Multiple Vitamins-Minerals (CENTRUM PO) Take by mouth daily.    . naproxen (NAPROSYN) 375 MG tablet Take 375 mg by mouth 3 (three) times daily as needed.  2  . omeprazole (PRILOSEC) 20 MG capsule Take 20 mg by mouth daily.     . simvastatin (ZOCOR) 40 MG tablet Take 40 mg by mouth daily.    Marland Kitchen. sulfamethoxazole-trimethoprim (BACTRIM DS,SEPTRA DS) 800-160 MG per tablet   0  . tiotropium (SPIRIVA) 18 MCG inhalation capsule Place 1 capsule (18 mcg total) into inhaler and inhale daily. 30 capsule 5   No current facility-administered medications for this visit.    Review of Systems Review of Systems  Constitutional: Negative.   Respiratory: Negative.   Cardiovascular: Negative.   Gastrointestinal: Positive for nausea. Negative for vomiting, diarrhea and constipation.    Blood pressure 130/73, pulse 76, temperature 98.7 F (37.1 C), resp. rate 12, height 5\' 3"  (1.6 m), weight 263 lb (119.296 kg).  Physical Exam Physical Exam  Constitutional: She is oriented to person, place, and time. She appears well-nourished.  HENT:  Mouth/Throat: Oropharynx is clear and moist.  Eyes: Conjunctivae  are normal. No scleral icterus.  Neck: Neck supple.  Cardiovascular: Normal rate, regular rhythm and normal heart sounds.   Pulmonary/Chest: Effort normal and breath sounds normal.  Abdominal: Soft. Bowel sounds are normal. There is no tenderness.  Musculoskeletal:       Back:  Lymphadenopathy:    She has no cervical adenopathy.  Neurological: She is alert and oriented to person, place, and time.  Skin: Skin is warm and dry.    Data Reviewed PCP notes of 12/13/2014 reviewed. Bactrim therapy initiated.  Assessment    Left gluteal abscess.    Plan    After a long discussion the patient was amenable to incision and drainage. This abscess may be what's making her feel less than ideal.  Chlor prep was applied to the skin. A total of 30 mL of 0.5%  Xylocaine with 0.25% Marcaine with 1-200,000 of epinephrine was utilized well tolerated. An ellipse of skin was excised and a large amount of purulent material and as well as the majority of the sebaceous cyst contents and wall were excised. This was well tolerated. Culture was obtained. Dry dressing was applied. Wound care was reviewed.  A prescription for Norco 5/325, #30 with the inscription 1/2 by mouth every 4 hours when necessary for pain with no refills was provided.      The patient will return in one week for wound evaluation with the nurse.   PCP:  Lauris Poag 12/15/2014, 8:04 PM

## 2014-12-14 NOTE — Patient Instructions (Signed)
Patient to return in one week nurse  

## 2014-12-15 DIAGNOSIS — L0231 Cutaneous abscess of buttock: Secondary | ICD-10-CM | POA: Insufficient documentation

## 2014-12-19 ENCOUNTER — Ambulatory Visit (INDEPENDENT_AMBULATORY_CARE_PROVIDER_SITE_OTHER): Payer: BLUE CROSS/BLUE SHIELD | Admitting: *Deleted

## 2014-12-19 DIAGNOSIS — L0231 Cutaneous abscess of buttock: Secondary | ICD-10-CM

## 2014-12-19 LAB — ANAEROBIC AND AEROBIC CULTURE

## 2014-12-19 NOTE — Patient Instructions (Signed)
Heating pad as needed

## 2014-12-19 NOTE — Progress Notes (Signed)
Patient came in today for a wound check/left buttock abscess.  The wound is clean, with no signs of infection noted. Minimal drainage noted. Knot noted beneath is small. She states it feels much better.. Follow up as scheduled.

## 2014-12-27 ENCOUNTER — Ambulatory Visit (INDEPENDENT_AMBULATORY_CARE_PROVIDER_SITE_OTHER): Payer: BLUE CROSS/BLUE SHIELD | Admitting: General Surgery

## 2014-12-27 ENCOUNTER — Encounter: Payer: Self-pay | Admitting: General Surgery

## 2014-12-27 VITALS — BP 110/68 | HR 79 | Resp 14 | Ht 63.0 in

## 2014-12-27 DIAGNOSIS — L0231 Cutaneous abscess of buttock: Secondary | ICD-10-CM

## 2014-12-27 NOTE — Patient Instructions (Addendum)
Use heat in the area for comfort and healing. Call with any concerns.

## 2014-12-27 NOTE — Progress Notes (Signed)
Patient ID: Sarah [name], female   DOB: June 22, 1950, 64 y.o.   MRN: [id number]  Chief Complaint  Patient presents with  . Other    follow up left buttock abscess    HPI Sarah [name] is a 64 y.o. female following up for an abscess of her left buttock. She states that the are is much better. She denies any chills or fevers. She is having only a small amount of drainage and a band aid is sufficient for this.  HPI  Past Medical History  Diagnosis Date  . Congestive heart failure   . Carotid artery disease   . HBP (high blood pressure)   . Heart murmur   . COPD (chronic obstructive pulmonary disease)     Past Surgical History  Procedure Laterality Date  . Abdominal hysterectomy  1991  . Colonoscopy    . Cardiac catheterization  1999    Family History  Problem Relation Age of Onset  . Stroke Father   . Hypertension Father   . Hypertension Mother   . Stroke Mother     Social History History  Substance Use Topics  . Smoking status: Former Smoker -- 0.50 packs/day for 27 years    Types: Cigarettes    Quit date: 01/15/1996  . Smokeless tobacco: Never Used  . Alcohol Use: 0.0 oz/week    0 Standard drinks or equivalent per week     Comment: WINE WEEKLY    No Known Allergies  Current Outpatient Prescriptions  Medication Sig Dispense Refill  . acetaminophen (TYLENOL) 500 MG tablet Take 500 mg by mouth as needed.    Marland Kitchen albuterol (PROVENTIL HFA;VENTOLIN HFA) 108 (90 BASE) MCG/ACT inhaler 2 puffs every 2-4 hours prn 1 Inhaler 2  . aspirin 81 MG tablet Take 81 mg by mouth daily.    . carvedilol (COREG) 12.5 MG tablet Take 12.5 mg by mouth 2 (two) times daily with a meal.    . furosemide (LASIX) 20 MG tablet Take 20 mg by mouth daily.    Marland Kitchen ibuprofen (ADVIL,MOTRIN) 800 MG tablet Take 800 mg by mouth as needed.    Marland Kitchen losartan (COZAAR) 50 MG tablet Take 50 mg by mouth daily.    . metFORMIN (GLUCOPHAGE) 500 MG tablet   0  . Multiple Vitamins-Minerals (CENTRUM PO) Take by mouth  daily.    . naproxen (NAPROSYN) 375 MG tablet Take 375 mg by mouth 3 (three) times daily as needed.  2  . omeprazole (PRILOSEC) 20 MG capsule Take 20 mg by mouth daily.     . simvastatin (ZOCOR) 40 MG tablet Take 40 mg by mouth daily.    Marland Kitchen tiotropium (SPIRIVA) 18 MCG inhalation capsule Place 1 capsule (18 mcg total) into inhaler and inhale daily. 30 capsule 5   No current facility-administered medications for this visit.    Review of Systems Review of Systems  Constitutional: Negative.   Respiratory: Negative.   Cardiovascular: Negative.     Blood pressure 110/68, pulse 79, resp. rate 14, height  (1.6 m).  Physical Exam Physical Exam  Constitutional: She is oriented to person, place, and time. She appears well-developed and well-nourished.  Musculoskeletal:       Legs: Neurological: She is alert and oriented to person, place, and time.  Skin: Skin is warm and dry.  Psychiatric: She has a normal mood and affect.    Data Reviewed Anaerobic Culture Final report (A)   Result 1 Comment (A)   Comments: Mixed anaerobic organisms, none  predominating.  Heavy growth     Aerobic Culture Final report   Result 1 Routine flora   Comments: Scant growth        The patient was treated with Bactrim DS.  Assessment    Doing well status post incision and drainage of left gluteal abscess.    Plan    The patient will report if she develops any recurrent symptoms. She's been encouraged to make use of local heat to help resolve the residual induration. Follow-up will be on an as-needed basis.    PCP: Dr Georgina PillionJadali   Kennedy, Caryl-Lyn M 12/27/2014, 3:40 PM

## 2015-02-02 ENCOUNTER — Other Ambulatory Visit: Payer: Self-pay | Admitting: Internal Medicine

## 2015-02-21 ENCOUNTER — Other Ambulatory Visit: Payer: Self-pay | Admitting: Internal Medicine

## 2015-02-26 ENCOUNTER — Other Ambulatory Visit: Payer: Self-pay | Admitting: *Deleted

## 2015-02-26 MED ORDER — ALBUTEROL SULFATE HFA 108 (90 BASE) MCG/ACT IN AERS: INHALATION_SPRAY | RESPIRATORY_TRACT | Status: DC

## 2015-07-13 ENCOUNTER — Other Ambulatory Visit: Payer: Self-pay | Admitting: Internal Medicine

## 2015-07-13 DIAGNOSIS — Z1231 Encounter for screening mammogram for malignant neoplasm of breast: Secondary | ICD-10-CM

## 2015-08-04 ENCOUNTER — Other Ambulatory Visit: Payer: Self-pay | Admitting: Internal Medicine

## 2015-08-10 ENCOUNTER — Ambulatory Visit: Payer: BLUE CROSS/BLUE SHIELD | Attending: Internal Medicine

## 2015-10-04 ENCOUNTER — Ambulatory Visit
Admission: RE | Admit: 2015-10-04 | Discharge: 2015-10-04 | Disposition: A | Discharge status: Home / Self Care | Payer: BLUE CROSS/BLUE SHIELD | Source: Ambulatory Visit | Attending: Internal Medicine | Admitting: Internal Medicine

## 2015-10-04 DIAGNOSIS — Z1231 Encounter for screening mammogram for malignant neoplasm of breast: Secondary | ICD-10-CM | POA: Insufficient documentation

## 2015-10-08 ENCOUNTER — Encounter: Payer: Self-pay | Admitting: Internal Medicine

## 2015-10-08 ENCOUNTER — Ambulatory Visit (INDEPENDENT_AMBULATORY_CARE_PROVIDER_SITE_OTHER): Payer: BLUE CROSS/BLUE SHIELD | Admitting: Internal Medicine

## 2015-10-08 VITALS — BP 120/74 | HR 86 | Ht 63.0 in | Wt 252.4 lb

## 2015-10-08 DIAGNOSIS — R0600 Dyspnea, unspecified: Secondary | ICD-10-CM

## 2015-10-08 DIAGNOSIS — R0609 Other forms of dyspnea: Secondary | ICD-10-CM | POA: Diagnosis not present

## 2015-10-08 DIAGNOSIS — J439 Emphysema, unspecified: Secondary | ICD-10-CM

## 2015-10-08 NOTE — Patient Instructions (Addendum)
Follow up with Dr. Dema SeverinMungal in: 6months - cont with Spiriva. -gargle and rinse after each use.  - cont with diet and exercise - cont with tobacco avoidance - cont with BP monitoring and control - pulmonary function testing and 6 minute walk test prior to follow up - 2 view CXR for sob prior to follow up visit.  - we will talk about sleep apnea at your next visit.

## 2015-10-08 NOTE — Assessment & Plan Note (Signed)
Multifactorial - dCHF, COPD, Obestiy, denconditioning  Plan - cont with CHF optimization, follow up with cardiology asneeded - Continue with Spiriva - wt. Loss and proper diet education given to patient.  - continue with exercise - OSA Screen at next visit

## 2015-10-08 NOTE — Assessment & Plan Note (Signed)
COPD Type A Level of obstruction - Moderate Symptoms - mild  Plan - continue with Spiriva  - patient educated on use and administration of inhaler, along with the side effects - if no improvement or worsening symptoms, will plan to add Advair - pfts, and 2 view cxr prior to follow up visit.

## 2015-10-08 NOTE — Progress Notes (Signed)
MRN# [id number] Sarah [name] Jul 26, 1950   CC: Chief Complaint  Patient presents with  . Follow-up    pt states breathing is well. occ non prod cough. denies sob, wheezing or chest pain/tightness.      Brief History: Synopsis - 65 year old female originally seen by pulmonary for shortness of breath and dyspnea on exertion. Diagnosed with COPD, moderate obstruction, but with mild clinical symptoms, placed on Spiriva.  Events since last clinic visit: She presents today for follow-up visit of COPD type A, with moderate obstruction on spirometry, mild clinical symptoms. She is accompanied by her boyfriend today. She states that she does not do much exercising over the fall and winter. She does endorse some intermittent cough and mild intermittent sputum production, otherwise doing well since on Spiriva. No worsening dyspnea on exertion.     Medication:   Current Outpatient Rx  Name  Route  Sig  Dispense  Refill  . acetaminophen (TYLENOL) 500 MG tablet   Oral   Take 500 mg by mouth as needed.         Marland Kitchen aspirin 81 MG tablet   Oral   Take 81 mg by mouth daily.         . furosemide (LASIX) 20 MG tablet   Oral   Take 20 mg by mouth daily.         Marland Kitchen ibuprofen (ADVIL,MOTRIN) 800 MG tablet   Oral   Take 800 mg by mouth as needed.         Marland Kitchen losartan (COZAAR) 50 MG tablet   Oral   Take 50 mg by mouth daily.         . metFORMIN (GLUCOPHAGE) 500 MG tablet            0   . Multiple Vitamins-Minerals (CENTRUM PO)   Oral   Take by mouth daily.         Marland Kitchen omeprazole (PRILOSEC) 20 MG capsule   Oral   Take 20 mg by mouth daily.          Marland Kitchen PROAIR HFA 108 (90 Base) MCG/ACT inhaler      INHALE TWO PUFFS BY MOUTH EVERY 2 TO 4 HOURS AS NEEDED   1 each   0   . simvastatin (ZOCOR) 40 MG tablet   Oral   Take 40 mg by mouth daily.         Marland Kitchen SPIRIVA HANDIHALER 18 MCG inhalation capsule      INHALE ONE DOSE BY MOUTH ONCE DAILY   30 capsule   0   . carvedilol  (COREG) 12.5 MG tablet   Oral   Take 12.5 mg by mouth 2 (two) times daily with a meal. Reported on 10/08/2015            Review of Systems: Gen:  Denies  fever, sweats, chills HEENT: Denies blurred vision, double vision, ear pain, eye pain, hearing loss, nose bleeds, sore throat Cvc:  No dizziness, chest pain or heaviness Resp:   Admits [id number] cough and intermittent white sputum production Gi: Denies swallowing difficulty, stomach pain, nausea or vomiting, diarrhea, constipation, bowel incontinence Gu:  Denies bladder incontinence, burning urine Ext:   No Joint pain, stiffness or swelling Skin: No skin rash, easy bruising or bleeding or hives Endoc:  No polyuria, polydipsia , polyphagia or weight change Other:  All other systems negative  Allergies:  Review of patient's allergies indicates no known allergies.  Physical Examination:  VS: BP 120/74 mmHg  Pulse  86  Ht 5\' 3"  (1.6 m)  Wt 252 lb 6.4 oz (114.488 kg)  BMI 44.72 kg/m2  SpO2 96%  General Appearance: No distress  HEENT: PERRLA, no ptosis, no other lesions noticed Pulmonary:normal breath sounds., diaphragmatic excursion normal.No wheezing, No rales   Cardiovascular:  Normal S1,S2.  No m/r/g.     Abdomen:Exam: Benign, Soft, non-tender, No masses  Skin:   warm, no rashes, no ecchymosis  Extremities: normal, no cyanosis, clubbing, warm with normal capillary refill.     Assessment and Plan: 65 year old seen in follow-up visit for COPD COPD type A COPD Type A Level of obstruction - Moderate Symptoms - mild  Plan - continue with Spiriva  - patient educated on use and administration of inhaler, along with the side effects - if no improvement or worsening symptoms, will plan to add Advair - pfts, 6mwt and 2 view cxr prior to follow up visit.       DOE (dyspnea on exertion) Multifactorial - dCHF, COPD, Obestiy, denconditioning  Plan - cont with CHF optimization, follow up with cardiology asneeded - Continue  with Spiriva - wt. Loss and proper diet education given to patient.  - continue with exercise - OSA Screen at next visit        Updated Medication List Outpatient Encounter Prescriptions as of 10/08/2015  Medication Sig  . acetaminophen (TYLENOL) 500 MG tablet Take 500 mg by mouth as needed.  Marland Kitchen. aspirin 81 MG tablet Take 81 mg by mouth daily.  . furosemide (LASIX) 20 MG tablet Take 20 mg by mouth daily.  Marland Kitchen. ibuprofen (ADVIL,MOTRIN) 800 MG tablet Take 800 mg by mouth as needed.  Marland Kitchen. losartan (COZAAR) 50 MG tablet Take 50 mg by mouth daily.  . metFORMIN (GLUCOPHAGE) 500 MG tablet   . Multiple Vitamins-Minerals (CENTRUM PO) Take by mouth daily.  Marland Kitchen. omeprazole (PRILOSEC) 20 MG capsule Take 20 mg by mouth daily.   Marland Kitchen. PROAIR HFA 108 (90 Base) MCG/ACT inhaler INHALE TWO PUFFS BY MOUTH EVERY 2 TO 4 HOURS AS NEEDED  . simvastatin (ZOCOR) 40 MG tablet Take 40 mg by mouth daily.  Marland Kitchen. SPIRIVA HANDIHALER 18 MCG inhalation capsule INHALE ONE DOSE BY MOUTH ONCE DAILY  . carvedilol (COREG) 12.5 MG tablet Take 12.5 mg by mouth 2 (two) times daily with a meal. Reported on 10/08/2015  . [DISCONTINUED] naproxen (NAPROSYN) 375 MG tablet Take 375 mg by mouth 3 (three) times daily as needed. Reported on 10/08/2015   No facility-administered encounter medications on file as of 10/08/2015.    Orders for this visit: Orders Placed This Encounter  Procedures  . DG Chest 2 View    Standing Status: Future     Number of Occurrences:      Standing Expiration Date: 12/07/2016    Order Specific Question:  Reason for Exam (SYMPTOM  OR DIAGNOSIS REQUIRED)    Answer:  DOE    Order Specific Question:  Preferred imaging location?    Answer:  [facility]  . Pulmonary function test    Standing Status: Future     Number of Occurrences:      Standing Expiration Date: 10/07/2016    Order Specific Question:  Where should this test be performed?    Answer:  [facility]    Order Specific Question:  Full PFT: includes  the following: basic spirometry, spirometry pre & post bronchodilator, diffusion capacity (DLCO), lung volumes    Answer:  Full PFT  . 6 minute walk    Standing Status:  Future     Number of Occurrences:      Standing Expiration Date: 10/07/2016    Order Specific Question:  Where should this test be performed?    Answer:  Other    Thank  you for the visitation and for allowing  [facility] to assist in the care of your patient. Our recommendations are noted above.  Please contact us if we can be of further service.  Stephanie Acre, MD [facility] Office Number: [phone]

## 2015-10-28 ENCOUNTER — Emergency Department
Admission: EM | Admit: 2015-10-28 | Discharge: 2015-10-28 | Disposition: A | Discharge status: Home / Self Care | Payer: BLUE CROSS/BLUE SHIELD | Attending: Emergency Medicine | Admitting: Emergency Medicine

## 2015-10-28 ENCOUNTER — Emergency Department: Payer: BLUE CROSS/BLUE SHIELD

## 2015-10-28 ENCOUNTER — Encounter: Payer: Self-pay | Admitting: Emergency Medicine

## 2015-10-28 DIAGNOSIS — Z791 Long term (current) use of non-steroidal anti-inflammatories (NSAID): Secondary | ICD-10-CM | POA: Diagnosis not present

## 2015-10-28 DIAGNOSIS — Y929 Unspecified place or not applicable: Secondary | ICD-10-CM | POA: Diagnosis not present

## 2015-10-28 DIAGNOSIS — Y939 Activity, unspecified: Secondary | ICD-10-CM | POA: Insufficient documentation

## 2015-10-28 DIAGNOSIS — S8001XA Contusion of right knee, initial encounter: Secondary | ICD-10-CM | POA: Insufficient documentation

## 2015-10-28 DIAGNOSIS — W1839XA Other fall on same level, initial encounter: Secondary | ICD-10-CM | POA: Insufficient documentation

## 2015-10-28 DIAGNOSIS — Y999 Unspecified external cause status: Secondary | ICD-10-CM | POA: Insufficient documentation

## 2015-10-28 DIAGNOSIS — J449 Chronic obstructive pulmonary disease, unspecified: Secondary | ICD-10-CM | POA: Insufficient documentation

## 2015-10-28 DIAGNOSIS — Z7984 Long term (current) use of oral hypoglycemic drugs: Secondary | ICD-10-CM | POA: Insufficient documentation

## 2015-10-28 DIAGNOSIS — Z7982 Long term (current) use of aspirin: Secondary | ICD-10-CM | POA: Insufficient documentation

## 2015-10-28 DIAGNOSIS — I110 Hypertensive heart disease with heart failure: Secondary | ICD-10-CM | POA: Insufficient documentation

## 2015-10-28 DIAGNOSIS — Z87891 Personal history of nicotine dependence: Secondary | ICD-10-CM | POA: Diagnosis not present

## 2015-10-28 DIAGNOSIS — I509 Heart failure, unspecified: Secondary | ICD-10-CM | POA: Insufficient documentation

## 2015-10-28 DIAGNOSIS — S60222A Contusion of left hand, initial encounter: Secondary | ICD-10-CM | POA: Insufficient documentation

## 2015-10-28 DIAGNOSIS — S8991XA Unspecified injury of right lower leg, initial encounter: Secondary | ICD-10-CM | POA: Diagnosis present

## 2015-10-28 DIAGNOSIS — Z79899 Other long term (current) drug therapy: Secondary | ICD-10-CM | POA: Diagnosis not present

## 2015-10-28 DIAGNOSIS — S8002XA Contusion of left knee, initial encounter: Secondary | ICD-10-CM | POA: Insufficient documentation

## 2015-10-28 MED ORDER — HYDROCODONE-ACETAMINOPHEN 5-325 MG PO TABS: 1.0000 | ORAL_TABLET | ORAL | Status: DC | PRN

## 2015-10-28 MED ORDER — HYDROCODONE-ACETAMINOPHEN 5-325 MG PO TABS
1.0000 | ORAL_TABLET | Freq: Once | ORAL | Status: AC
  Administered 2015-10-28: 1 via ORAL
  Filled 2015-10-28: qty 1

## 2015-10-28 NOTE — Discharge Instructions (Signed)
Contusion A contusion is a deep bruise. Contusions happen when an injury causes bleeding under the skin. Symptoms of bruising include pain, swelling, and discolored skin. The skin may turn blue, purple, or yellow. HOME CARE   Rest the injured area.  If told, put ice on the injured area.  Put ice in a plastic bag.  Place a towel between your skin and the bag.  Leave the ice on for 20 minutes, 2-3 times per day.  If told, put light pressure (compression) on the injured area using an elastic bandage. Make sure the bandage is not too tight. Remove it and put it back on as told by your doctor.  If possible, raise (elevate) the injured area above the level of your heart while you are sitting or lying down.  Take over-the-counter and prescription medicines only as told by your doctor. GET HELP IF:  Your symptoms do not get better after several days of treatment.  Your symptoms get worse.  You have trouble moving the injured area. GET HELP RIGHT AWAY IF:   You have very bad pain.  You have a loss of feeling (numbness) in a hand or foot.  Your hand or foot turns pale or cold.   This information is not intended to replace advice given to you by your health care provider. Make sure you discuss any questions you have with your health care provider.   Document Released: 11/19/2007 Document Revised: 02/21/2015 Document Reviewed: 10/18/2014 Elsevier Interactive Patient Education 2016 Elsevier Inc.  Cryotherapy Cryotherapy is when you put ice on your injury. Ice helps lessen pain and puffiness (swelling) after an injury. Ice works the best when you start using it in the first 24 to 48 hours after an injury. HOME CARE  Put a dry or damp towel between the ice pack and your skin.  You may press gently on the ice pack.  Leave the ice on for no more than 10 to 20 minutes at a time.  Check your skin after 5 minutes to make sure your skin is okay.  Rest at least 20 minutes between ice  pack uses.  Stop using ice when your skin loses feeling (numbness).  Do not use ice on someone who cannot tell you when it hurts. This includes small children and people with memory problems (dementia). GET HELP RIGHT AWAY IF:  You have white spots on your skin.  Your skin turns blue or pale.  Your skin feels waxy or hard.  Your puffiness gets worse. MAKE SURE YOU:   Understand these instructions.  Will watch your condition.  Will get help right away if you are not doing well or get worse.   This information is not intended to replace advice given to you by your health care provider. Make sure you discuss any questions you have with your health care provider.   Document Released: 11/19/2007 Document Revised: 08/25/2011 Document Reviewed: 01/23/2011 Elsevier Interactive Patient Education [employer]2016 Elsevier Inc.   Follow-up with her regular doctor if any continued problems. Norco as needed for pain every 4 hours. Ice and elevate as needed for pain and swelling.

## 2015-10-28 NOTE — ED Notes (Signed)
Larey SeatFell this am  Landed on both knees and wrist ..having increased pain to left knee  Abrasions noted to both knee  Also having some pain to left wrist also

## 2015-10-28 NOTE — ED Provider Notes (Signed)
[facility] Emergency Department Provider Note   ____________________________________________  Time seen: Approximately 12:27 PM  I have reviewed the triage vital signs and the nursing notes.   HISTORY  Chief Complaint Fall   HPI Sarah [name] is a 65 y.o. female is here with complaint of bilateral knee pain and left wrist/hand pain.  Patient states she lost her footing and fell directly on her knees to concrete. She denies any head injury or loss of consciousness. She is superficial abrasions to both knees and tried to catch herself with her left hand. She denies any previous injuries to her extremities. Patient was able to ambulate slowly. She denies any dizziness, nausea, vomiting, or shortness of breath. Currently she rates her pain as 6/10.   Past Medical History  Diagnosis Date  . Congestive heart failure (HCC)   . Carotid artery disease (HCC)   . HBP (high blood pressure)   . Heart murmur   . COPD (chronic obstructive pulmonary disease) [facility])     Patient Active Problem List   Diagnosis Date Noted  . Left buttock abscess 12/15/2014  . DOE (dyspnea on exertion) 06/05/2014  . COPD type A (HCC) 06/05/2014  . Obesity 06/05/2014    Past Surgical History  Procedure Laterality Date  . Abdominal hysterectomy  1991  . Colonoscopy    . Cardiac catheterization  1999    Current Outpatient Rx  Name  Route  Sig  Dispense  Refill  . acetaminophen (TYLENOL) 500 MG tablet   Oral   Take 500 mg by mouth as needed.         Marland Kitchen aspirin 81 MG tablet   Oral   Take 81 mg by mouth daily.         . carvedilol (COREG) 12.5 MG tablet   Oral   Take 12.5 mg by mouth 2 (two) times daily with a meal. Reported on 10/08/2015         . furosemide (LASIX) 20 MG tablet   Oral   Take 20 mg by mouth daily.         Marland Kitchen HYDROcodone-acetaminophen (NORCO/VICODIN) 5-325 MG tablet   Oral   Take 1 tablet by mouth every 4 (four) hours as needed for moderate  pain.   20 tablet   0   . ibuprofen (ADVIL,MOTRIN) 800 MG tablet   Oral   Take 800 mg by mouth as needed.         Marland Kitchen losartan (COZAAR) 50 MG tablet   Oral   Take 50 mg by mouth daily.         . metFORMIN (GLUCOPHAGE) 500 MG tablet            0   . Multiple Vitamins-Minerals (CENTRUM PO)   Oral   Take by mouth daily.         Marland Kitchen omeprazole (PRILOSEC) 20 MG capsule   Oral   Take 20 mg by mouth daily.          Marland Kitchen PROAIR HFA 108 (90 Base) MCG/ACT inhaler      INHALE TWO PUFFS BY MOUTH EVERY 2 TO 4 HOURS AS NEEDED   1 each   0   . simvastatin (ZOCOR) 40 MG tablet   Oral   Take 40 mg by mouth daily.         Marland Kitchen SPIRIVA HANDIHALER 18 MCG inhalation capsule      INHALE ONE DOSE BY MOUTH ONCE DAILY   30 capsule  0     Allergies Review of patient's allergies indicates no known allergies.  Family History  Problem Relation Age of Onset  . Stroke Father   . Hypertension Father   . Hypertension Mother   . Stroke Mother   . Breast cancer Cousin [phone]  . Breast cancer Cousin [phone]    Social History Social History  Substance Use Topics  . Smoking status: Former Smoker -- 0.50 packs/day for 27 years    Types: Cigarettes    Quit date: 01/15/1996  . Smokeless tobacco: Never Used  . Alcohol Use: 0.0 oz/week    0 Standard drinks or equivalent per week     Comment: WINE WEEKLY    Review of Systems Constitutional: No fever/chills Eyes: No visual changes. ENT: No trauma Cardiovascular: Denies chest pain. Respiratory: Denies shortness of breath. Gastrointestinal: No abdominal pain.  No nausea, no vomiting.   Musculoskeletal: Positive for bilateral knee pain, left hand and wrist pain. Skin: Positive for superficial abrasions bilateral knees. Neurological: Negative for headaches, focal weakness or numbness.  10-point ROS otherwise negative.  ____________________________________________   PHYSICAL EXAM:  VITAL SIGNS: ED Triage Vitals  Enc Vitals Group      BP 10/28/15 1223 122/63 mmHg     Pulse Rate 10/28/15 1223 69     Resp 10/28/15 1223 18     Temp 10/28/15 1223 98.2 F (36.8 C)     Temp Source 10/28/15 1223 Oral     SpO2 10/28/15 1223 96 %     Weight 10/28/15 1223 248 lb (112.492 kg)     Height 10/28/15 1223 5\' 4"  (1.626 m)     Head Cir --      Peak Flow --      Pain Score 10/28/15 1223 6     Pain Loc --      Pain Edu? --      Excl. in GC? --     Constitutional: Alert and oriented. Well appearing and in no acute distress. Eyes: Conjunctivae are normal. PERRL. EOMI. Head: Atraumatic. Nose: No congestion/rhinnorhea. Mouth/Throat: Mucous membranes are moist.  Oropharynx non-erythematous. Neck: No stridor.  No posterior tenderness on palpation of cervical spine. Range of motion is within normal limits without any restriction Cardiovascular: Normal rate, regular rhythm. Grossly normal heart sounds.  Good peripheral circulation. Respiratory: Normal respiratory effort.  No retractions. Lungs CTAB. Gastrointestinal: Soft and nontender. No distention.  Musculoskeletal: Left hand and wrist there is no edema present. There is minimal tenderness on palpation. Range of motion of all digits within normal limits. Motor sensory function intact. Bilateral knees without any gross deformity. Right knee with minimal soft tissue swelling. No obvious deformity is present in range of motion is without noted crepitus. Left knee with moderate soft tissue edema but no effusion present. No crepitus is noted but range of motion is restricted secondary to increased pain. Motor sensory function intact distal to the injury. Neurologic:  Normal speech and language. No gross focal neurologic deficits are appreciated. No gait instability. Skin:  Skin is warm, dry and intact. Positive for superficial abrasions bilateral anterior knees without any active bleeding. Psychiatric: Mood and affect are normal. Speech and behavior are  normal.  ____________________________________________   LABS (all labs ordered are listed, but only abnormal results are displayed)  Labs Reviewed - No data to display ____________________________________________  EKG  Deferred ____________________________________________  RADIOLOGY X-ray left knee per radiologist shows no bony abnormality but some mild osteoarthritis. X-ray of the right knee per radiologist  shows pre-existing osteoarthritis. Left hand x-ray shows no fracture or dislocation. I, Tommi Rumps, personally viewed and evaluated these images (plain radiographs) as part of my medical decision making, as well as reviewing the written report by the radiologist.  ____________________________________________   PROCEDURES  Procedure(s) performed: None  Critical Care performed: No  ____________________________________________   INITIAL IMPRESSION / ASSESSMENT AND PLAN / ED COURSE  Pertinent labs & imaging results that were available during my care of the patient were reviewed by me and considered in my medical decision making (see chart for details).  Patient is given a prescription for Norco as needed for pain. She is to use ice to her contusions as needed for comfort and to reduce swelling. Patient is to follow-up with her primary care doctor if any continued problems. ____________________________________________   FINAL CLINICAL IMPRESSION(S) / ED DIAGNOSES  Final diagnoses:  Contusion, knee, left, initial encounter  Contusion, knee, right, initial encounter  Contusion, hand, left, initial encounter      NEW MEDICATIONS STARTED DURING THIS VISIT:  Discharge Medication List as of 10/28/2015  1:58 PM    START taking these medications   Details  HYDROcodone-acetaminophen (NORCO/VICODIN) 5-325 MG tablet Take 1 tablet by mouth every 4 (four) hours as needed for moderate pain., Starting 10/28/2015, Until Discontinued, Print         Note:  This  document was prepared using Dragon voice recognition software and may include unintentional dictation errors.    Tommi Rumps, PA-C 10/28/15 1751  Phineas Semen, MD 10/29/15 [phone]

## 2015-12-28 ENCOUNTER — Other Ambulatory Visit: Payer: Self-pay | Admitting: Internal Medicine

## 2015-12-28 DIAGNOSIS — I509 Heart failure, unspecified: Secondary | ICD-10-CM

## 2015-12-28 DIAGNOSIS — R1310 Dysphagia, unspecified: Secondary | ICD-10-CM

## 2016-01-04 ENCOUNTER — Ambulatory Visit
Admission: RE | Admit: 2016-01-04 | Discharge: 2016-01-04 | Disposition: A | Discharge status: Home / Self Care | Payer: BLUE CROSS/BLUE SHIELD | Source: Ambulatory Visit | Attending: Internal Medicine | Admitting: Internal Medicine

## 2016-01-04 ENCOUNTER — Ambulatory Visit: Payer: BLUE CROSS/BLUE SHIELD

## 2016-01-04 DIAGNOSIS — I517 Cardiomegaly: Secondary | ICD-10-CM | POA: Insufficient documentation

## 2016-01-04 DIAGNOSIS — I313 Pericardial effusion (noninflammatory): Secondary | ICD-10-CM | POA: Insufficient documentation

## 2016-01-04 DIAGNOSIS — K219 Gastro-esophageal reflux disease without esophagitis: Secondary | ICD-10-CM | POA: Diagnosis not present

## 2016-01-04 DIAGNOSIS — R1310 Dysphagia, unspecified: Secondary | ICD-10-CM

## 2016-01-04 DIAGNOSIS — I509 Heart failure, unspecified: Secondary | ICD-10-CM | POA: Diagnosis not present

## 2016-01-04 DIAGNOSIS — K222 Esophageal obstruction: Secondary | ICD-10-CM | POA: Diagnosis not present

## 2016-01-04 DIAGNOSIS — K449 Diaphragmatic hernia without obstruction or gangrene: Secondary | ICD-10-CM | POA: Diagnosis not present

## 2016-01-04 NOTE — Progress Notes (Signed)
*  PRELIMINARY RESULTS* Echocardiogram 2D Echocardiogram has been performed.  Cristela BlueHege, Rhiley Solem 01/04/2016, 10:59 AM

## 2016-01-11 ENCOUNTER — Other Ambulatory Visit: Payer: Self-pay

## 2016-01-11 ENCOUNTER — Telehealth: Payer: Self-pay | Admitting: Gastroenterology

## 2016-01-11 NOTE — Telephone Encounter (Signed)
Gastroenterology Pre-Procedure Review  Request Date: 02/11/2016 Requesting Physician: Dr. [name]  PATIENT REVIEW QUESTIONS: The patient responded to the following health history questions as indicated:    1. Are you having any GI issues? no 2. Do you have a personal history of Polyps? no 3. Do you have a family history of Colon Cancer or Polyps? no 4. Diabetes Mellitus? no 5. Joint replacements in the past 12 months?no 6. Major health problems in the past 3 months?no 7. Any artificial heart valves, MVP, or defibrillator?no    MEDICATIONS & ALLERGIES:    Patient reports the following regarding taking any anticoagulation/antiplatelet therapy:   Plavix, Coumadin, Eliquis, Xarelto, Lovenox, Pradaxa, Brilinta, or Effient? no Aspirin? yes (heart health)  Patient confirms/reports the following medications:  Current Outpatient Prescriptions  Medication Sig Dispense Refill  . acetaminophen (TYLENOL) 500 MG tablet Take 500 mg by mouth as needed.    Marland Kitchen aspirin 81 MG tablet Take 81 mg by mouth daily.    . carvedilol (COREG) 12.5 MG tablet Take 12.5 mg by mouth 2 (two) times daily with a meal. Reported on 10/08/2015    . furosemide (LASIX) 20 MG tablet Take 20 mg by mouth daily.    Marland Kitchen losartan (COZAAR) 50 MG tablet Take 50 mg by mouth daily.    . metFORMIN (GLUCOPHAGE) 500 MG tablet   0  . Multiple Vitamins-Minerals (CENTRUM PO) Take by mouth daily.    Marland Kitchen omeprazole (PRILOSEC) 20 MG capsule Take 20 mg by mouth daily.     Marland Kitchen PROAIR HFA 108 (90 Base) MCG/ACT inhaler INHALE TWO PUFFS BY MOUTH EVERY 2 TO 4 HOURS AS NEEDED 1 each 0  . simvastatin (ZOCOR) 40 MG tablet Take 40 mg by mouth daily.    Marland Kitchen SPIRIVA HANDIHALER 18 MCG inhalation capsule INHALE ONE DOSE BY MOUTH ONCE DAILY 30 capsule 0   No current facility-administered medications for this visit.     Patient confirms/reports the following allergies:  No Known Allergies  No orders of the defined types were placed in this  encounter.   AUTHORIZATION INFORMATION Primary Insurance: 1D#: Group #:  Secondary Insurance: 1D#: Group #:  SCHEDULE INFORMATION: Date: 02/11/2016 Time: Location: [facility]

## 2016-01-11 NOTE — Telephone Encounter (Signed)
Upper endo

## 2016-01-11 NOTE — Telephone Encounter (Signed)
Dysphagia R13.10 [facility] 02/11/2016 Please pre cert

## 2016-02-08 NOTE — Discharge Instructions (Signed)

## 2016-02-11 ENCOUNTER — Encounter
Admission: RE | Disposition: A | Discharge status: Home / Self Care | Payer: Self-pay | Source: Ambulatory Visit | Attending: Gastroenterology

## 2016-02-11 ENCOUNTER — Ambulatory Visit: Payer: BLUE CROSS/BLUE SHIELD | Admitting: Anesthesiology

## 2016-02-11 ENCOUNTER — Ambulatory Visit
Admission: RE | Admit: 2016-02-11 | Discharge: 2016-02-11 | Disposition: A | Discharge status: Home / Self Care | Payer: BLUE CROSS/BLUE SHIELD | Source: Ambulatory Visit | Attending: Gastroenterology | Admitting: Gastroenterology

## 2016-02-11 DIAGNOSIS — K3189 Other diseases of stomach and duodenum: Secondary | ICD-10-CM

## 2016-02-11 DIAGNOSIS — R011 Cardiac murmur, unspecified: Secondary | ICD-10-CM | POA: Diagnosis not present

## 2016-02-11 DIAGNOSIS — I509 Heart failure, unspecified: Secondary | ICD-10-CM | POA: Insufficient documentation

## 2016-02-11 DIAGNOSIS — Z7984 Long term (current) use of oral hypoglycemic drugs: Secondary | ICD-10-CM | POA: Insufficient documentation

## 2016-02-11 DIAGNOSIS — Z90710 Acquired absence of both cervix and uterus: Secondary | ICD-10-CM | POA: Insufficient documentation

## 2016-02-11 DIAGNOSIS — R1310 Dysphagia, unspecified: Secondary | ICD-10-CM

## 2016-02-11 DIAGNOSIS — Z7982 Long term (current) use of aspirin: Secondary | ICD-10-CM | POA: Insufficient documentation

## 2016-02-11 DIAGNOSIS — I7789 Other specified disorders of arteries and arterioles: Secondary | ICD-10-CM | POA: Insufficient documentation

## 2016-02-11 DIAGNOSIS — Z803 Family history of malignant neoplasm of breast: Secondary | ICD-10-CM | POA: Diagnosis not present

## 2016-02-11 DIAGNOSIS — Z823 Family history of stroke: Secondary | ICD-10-CM | POA: Diagnosis not present

## 2016-02-11 DIAGNOSIS — I110 Hypertensive heart disease with heart failure: Secondary | ICD-10-CM | POA: Diagnosis not present

## 2016-02-11 DIAGNOSIS — J449 Chronic obstructive pulmonary disease, unspecified: Secondary | ICD-10-CM | POA: Diagnosis not present

## 2016-02-11 DIAGNOSIS — E119 Type 2 diabetes mellitus without complications: Secondary | ICD-10-CM | POA: Diagnosis not present

## 2016-02-11 DIAGNOSIS — K2970 Gastritis, unspecified, without bleeding: Secondary | ICD-10-CM | POA: Insufficient documentation

## 2016-02-11 DIAGNOSIS — I739 Peripheral vascular disease, unspecified: Secondary | ICD-10-CM | POA: Insufficient documentation

## 2016-02-11 DIAGNOSIS — Z87891 Personal history of nicotine dependence: Secondary | ICD-10-CM | POA: Insufficient documentation

## 2016-02-11 DIAGNOSIS — Z8249 Family history of ischemic heart disease and other diseases of the circulatory system: Secondary | ICD-10-CM | POA: Diagnosis not present

## 2016-02-11 DIAGNOSIS — Z79899 Other long term (current) drug therapy: Secondary | ICD-10-CM | POA: Diagnosis not present

## 2016-02-11 HISTORY — PX: ESOPHAGEAL DILATION: SHX303

## 2016-02-11 HISTORY — PX: ESOPHAGOGASTRODUODENOSCOPY (EGD) WITH PROPOFOL: SHX5813

## 2016-02-11 HISTORY — DX: Type 2 diabetes mellitus without complications: E11.9

## 2016-02-11 LAB — GLUCOSE, CAPILLARY
Glucose-Capillary: 100 mg/dL — ABNORMAL HIGH (ref 65–99)
Glucose-Capillary: 104 mg/dL — ABNORMAL HIGH (ref 65–99)

## 2016-02-11 SURGERY — ESOPHAGOGASTRODUODENOSCOPY (EGD) WITH PROPOFOL
Anesthesia: Monitor Anesthesia Care | Site: Throat | Wound class: Clean Contaminated

## 2016-02-11 MED ORDER — LIDOCAINE HCL (CARDIAC) 20 MG/ML IV SOLN
INTRAVENOUS | Status: DC | PRN
  Administered 2016-02-11: 50 mg via INTRAVENOUS

## 2016-02-11 MED ORDER — LACTATED RINGERS IV SOLN
INTRAVENOUS | Status: DC
  Administered 2016-02-11: 08:00:00 via INTRAVENOUS

## 2016-02-11 MED ORDER — PROPOFOL 10 MG/ML IV BOLUS
INTRAVENOUS | Status: DC | PRN
  Administered 2016-02-11 (×2): 20 mg via INTRAVENOUS
  Administered 2016-02-11: 30 mg via INTRAVENOUS
  Administered 2016-02-11: 20 mg via INTRAVENOUS
  Administered 2016-02-11: 60 mg via INTRAVENOUS

## 2016-02-11 MED ORDER — GLYCOPYRROLATE 0.2 MG/ML IJ SOLN
INTRAMUSCULAR | Status: DC | PRN
  Administered 2016-02-11: 0.2 mg via INTRAVENOUS

## 2016-02-11 SURGICAL SUPPLY — 32 items
BALLN DILATOR 10-12 8 (BALLOONS)
BALLN DILATOR 12-15 8 (BALLOONS)
BALLN DILATOR 15-18 8 (BALLOONS)
BALLN DILATOR CRE 0-12 8 (BALLOONS)
BALLN DILATOR ESOPH 8 10 CRE (MISCELLANEOUS) IMPLANT
BALLOON DILATOR 12-15 8 (BALLOONS) IMPLANT
BALLOON DILATOR 15-18 8 (BALLOONS) IMPLANT
BALLOON DILATOR CRE 0-12 8 (BALLOONS) IMPLANT
BLOCK BITE 60FR ADLT L/F GRN (MISCELLANEOUS) ×2 IMPLANT
CANISTER SUCT 1200ML W/VALVE (MISCELLANEOUS) ×2 IMPLANT
CLIP HMST 235XBRD CATH ROT (MISCELLANEOUS) IMPLANT
CLIP RESOLUTION 360 11X235 (MISCELLANEOUS)
FCP ESCP3.2XJMB 240X2.8X (MISCELLANEOUS) ×1
FORCEPS BIOP RAD 4 LRG CAP 4 (CUTTING FORCEPS) IMPLANT
FORCEPS BIOP RJ4 240 W/NDL (MISCELLANEOUS) ×1
FORCEPS ESCP3.2XJMB 240X2.8X (MISCELLANEOUS) ×1 IMPLANT
GOWN CVR UNV OPN BCK APRN NK (MISCELLANEOUS) ×2 IMPLANT
GOWN ISOL THUMB LOOP REG UNIV (MISCELLANEOUS) ×2
INJECTOR VARIJECT VIN23 (MISCELLANEOUS) IMPLANT
KIT DEFENDO VALVE AND CONN (KITS) IMPLANT
KIT ENDO PROCEDURE OLY (KITS) ×2 IMPLANT
MARKER SPOT ENDO TATTOO 5ML (MISCELLANEOUS) IMPLANT
PAD GROUND ADULT SPLIT (MISCELLANEOUS) IMPLANT
RETRIEVER NET PLAT FOOD (MISCELLANEOUS) IMPLANT
SNARE SHORT THROW 13M SML OVAL (MISCELLANEOUS) IMPLANT
SNARE SHORT THROW 30M LRG OVAL (MISCELLANEOUS) IMPLANT
SPOT EX ENDOSCOPIC TATTOO (MISCELLANEOUS)
SYR INFLATION 60ML (SYRINGE) IMPLANT
TRAP ETRAP POLY (MISCELLANEOUS) IMPLANT
VARIJECT INJECTOR VIN23 (MISCELLANEOUS)
WATER STERILE IRR 250ML POUR (IV SOLUTION) ×2 IMPLANT
WIRE CRE 18-20MM 8CM F G (MISCELLANEOUS) IMPLANT

## 2016-02-11 NOTE — Transfer of Care (Signed)
Immediate Anesthesia Transfer of Care Note  Patient: Sarah [name]  Procedure(s) Performed: Procedure(s) with comments: ESOPHAGOGASTRODUODENOSCOPY (EGD) WITH PROPOFOL (N/A) - DIABETIC ESOPHAGEAL DILATION (N/A)  Patient Location: PACU  Anesthesia Type: MAC  Level of Consciousness: awake, alert  and patient cooperative  Airway and Oxygen Therapy: Patient Spontanous Breathing and Patient connected to supplemental oxygen  Post-op Assessment: Post-op Vital signs reviewed, Patient's Cardiovascular Status Stable, Respiratory Function Stable, Patent Airway and No signs of Nausea or vomiting  Post-op Vital Signs: Reviewed and stable  Complications: No apparent anesthesia complications

## 2016-02-11 NOTE — H&P (Signed)
Midge Miniumarren Dillyn Menna, MD [facility] [address] [place], [place] [address] Phone: [phone] Fax : [phone]  Primary Care Physician:  Sherrie MustacheFayegh Jadali, MD Primary Gastroenterologist:  Dr. Servando SnareWohl  Pre-Procedure History & Physical: HPI:  Sarah FrameGladys O [name] is a 65 y.o. female is here for an endoscopy.   Past Medical History:  Diagnosis Date  . Carotid artery disease (HCC)   . Congestive heart failure (HCC)   . COPD (chronic obstructive pulmonary disease) (HCC)   . Diabetes mellitus without complication (HCC)    pt denies, however is on metformin daily  . HBP (high blood pressure)   . Heart murmur     Past Surgical History:  Procedure Laterality Date  . ABDOMINAL HYSTERECTOMY  1991  . CARDIAC CATHETERIZATION  1999  . COLONOSCOPY      Prior to Admission medications   Medication Sig Start Date End Date Taking? Authorizing Provider  acetaminophen (TYLENOL) 500 MG tablet Take 500 mg by mouth as needed.   Yes Historical Provider, MD  aspirin 81 MG tablet Take 81 mg by mouth daily.   Yes Historical Provider, MD  carvedilol (COREG) 12.5 MG tablet Take 12.5 mg by mouth 2 (two) times daily with a meal. Reported on 10/08/2015   Yes Historical Provider, MD  furosemide (LASIX) 20 MG tablet Take 20 mg by mouth daily.   Yes Historical Provider, MD  losartan (COZAAR) 50 MG tablet Take 50 mg by mouth daily.   Yes Historical Provider, MD  metFORMIN (GLUCOPHAGE) 500 MG tablet  11/17/14  Yes Historical Provider, MD  Multiple Vitamins-Minerals (CENTRUM PO) Take by mouth daily.   Yes Historical Provider, MD  omeprazole (PRILOSEC) 20 MG capsule Take 20 mg by mouth daily.    Yes Historical Provider, MD  PROAIR HFA 108 (90 Base) MCG/ACT inhaler INHALE TWO PUFFS BY MOUTH EVERY 2 TO 4 HOURS AS NEEDED 08/31/15  Yes Vishal Mungal, MD  simvastatin (ZOCOR) 40 MG tablet Take 40 mg by mouth daily.   Yes Historical Provider, MD  SPIRIVA HANDIHALER 18 MCG inhalation capsule INHALE ONE DOSE BY MOUTH ONCE DAILY 02/02/15   Yes Stephanie AcreVishal Mungal, MD    Allergies as of 01/11/2016  . (No Known Allergies)    Family History  Problem Relation Age of Onset  . Stroke Father   . Hypertension Father   . Hypertension Mother   . Stroke Mother   . Breast cancer Cousin [id number]  . Breast cancer Cousin [phone]    Social History   Social History  . Marital status: Widowed    Spouse name: N/A  . Number of children: 3  . Years of education: 12   Occupational History  . Human resources officeradministrator assistant    Social History Main Topics  . Smoking status: Former Smoker    Packs/day: 0.50    Years: 27.00    Types: Cigarettes    Quit date: 01/15/1996  . Smokeless tobacco: Never Used  . Alcohol use 0.0 oz/week     Comment: WINE WEEKLY  . Drug use: No  . Sexual activity: Not on file   Other Topics Concern  . Not on file   Social History Narrative  . No narrative on file    Review of Systems: See HPI, otherwise negative ROS  Physical Exam: BP 119/74   Pulse 74   Temp 97.6 F (36.4 C) (Temporal)   Ht 5\' 3"  (1.6 m)   Wt 246 lb (111.6 kg)   SpO2 97%   BMI 43.58 kg/m  General:  Alert,  pleasant and cooperative in NAD Head:  Normocephalic and atraumatic. Neck:  Supple; no masses or thyromegaly. Lungs:  Clear throughout to auscultation.    Heart:  Regular rate and rhythm. Abdomen:  Soft, nontender and nondistended. Normal bowel sounds, without guarding, and without rebound.   Neurologic:  Alert and  oriented x4;  grossly normal neurologically.  Impression/Plan: Sarah [name] is here for an endoscopy to be performed for dysphagia  Risks, benefits, limitations, and alternatives regarding  endoscopy have been reviewed with the patient.  Questions have been answered.  All parties agreeable.   Midge Minium, MD  02/11/2016, 7:42 AM

## 2016-02-11 NOTE — Op Note (Signed)
[facility] Gastroenterology Patient Name: Sarah [name] Procedure Date: 02/11/2016 7:40 AM MRN: [id number] Account #: [id number] Date of Birth: 04/09/1951 Admit Type: Outpatient Age: 65 Room: [facility] OR ROOM 01 Gender: Female Note Status: Finalized Procedure:            Upper GI endoscopy Indications:          Dysphagia Providers:            Midge Minium MD, MD Referring MD:         Sherrie Mustache, MD (Referring MD) Medicines:            Propofol per Anesthesia Complications:        No immediate complications. Procedure:            Pre-Anesthesia Assessment:                       - Prior to the procedure, a History and Physical was                        performed, and patient medications and allergies were                        reviewed. The patient's tolerance of previous                        anesthesia was also reviewed. The risks and benefits of                        the procedure and the sedation options and risks were                        discussed with the patient. All questions were                        answered, and informed consent was obtained. Prior                        Anticoagulants: The patient has taken no previous                        anticoagulant or antiplatelet agents. ASA Grade                        Assessment: II - A patient with mild systemic disease.                        After reviewing the risks and benefits, the patient was                        deemed in satisfactory condition to undergo the                        procedure.                       After obtaining informed consent, the endoscope was                        passed under direct vision. Throughout the procedure,  the patient's blood pressure, pulse, and oxygen                        saturations were monitored continuously. The was                        introduced through the mouth, and advanced to the                        second part of  duodenum. The upper GI endoscopy was                        accomplished without difficulty. The patient tolerated                        the procedure well. Findings:      The examined esophagus was normal. Random biopsies were obtained in the       middle third of the esophagus with cold forceps for histology. The scope       was withdrawn. Dilation was performed with a Maloney dilator with no       resistance at 54 Fr. The dilation site was examined following endoscope       reinsertion and showed complete resolution of luminal narrowing.      Localized mildly erythematous mucosa without bleeding was found in the       gastric antrum. Biopsies were taken with a cold forceps for histology.      The examined duodenum was normal. Impression:           - Normal esophagus. Dilated.                       - Erythematous mucosa in the antrum. Biopsied.                       - Normal examined duodenum.                       - Random biopsies were obtained in the middle third of                        the esophagus. Recommendation:       - Await pathology results. Procedure Code(s):    --- Professional ---                       [phone], Esophagogastroduodenoscopy, flexible, transoral;                        with biopsy, single or multiple                       43450, Dilation of esophagus, by unguided sound or                        bougie, single or multiple passes Diagnosis Code(s):    --- Professional ---                       R13.10, Dysphagia, unspecified                       K31.89, Other diseases of stomach and duodenum  CPT copyright 2016 American Medical Association. All rights reserved. The codes documented in this report are preliminary and upon coder review may  be revised to meet current compliance requirements. Midge Minium MD, MD 02/11/2016 8:08:56 AM This report has been signed electronically. Number of Addenda: 0 Note Initiated On: 02/11/2016 7:40 AM      [facility]

## 2016-02-11 NOTE — Anesthesia Procedure Notes (Signed)
Procedure Name: MAC Performed by: Sua Spadafora Pre-anesthesia Checklist: Patient identified, Emergency Drugs available, Suction available, Timeout performed and Patient being monitored Patient Re-evaluated:Patient Re-evaluated prior to inductionOxygen Delivery Method: Nasal cannula Placement Confirmation: positive ETCO2       

## 2016-02-11 NOTE — Anesthesia Preprocedure Evaluation (Signed)
Anesthesia Evaluation  Patient identified by MRN, date of birth, ID band Patient awake    Reviewed: Allergy & Precautions, H&P , NPO status , Patient's Chart, lab work & pertinent test results  History of Anesthesia Complications Negative for: history of anesthetic complications  Airway Mallampati: II  TM Distance: >3 FB Neck ROM: full    Dental  (+) Edentulous Upper, Edentulous Lower   Pulmonary shortness of breath and with exertion, COPD,  COPD inhaler, former smoker,    Pulmonary exam normal        Cardiovascular hypertension, + Peripheral Vascular Disease, +CHF and + DOE  Normal cardiovascular exam     Neuro/Psych negative neurological ROS     GI/Hepatic negative GI ROS, Neg liver ROS,   Endo/Other  diabetes  Renal/GU negative Renal ROS  negative genitourinary   Musculoskeletal   Abdominal   Peds  Hematology   Anesthesia Other Findings   Reproductive/Obstetrics negative OB ROS                             Anesthesia Physical Anesthesia Plan  ASA: III  Anesthesia Plan: MAC   Post-op Pain Management:    Induction:   Airway Management Planned:   Additional Equipment:   Intra-op Plan:   Post-operative Plan:   Informed Consent:   Plan Discussed with:   Anesthesia Plan Comments:         Anesthesia Quick Evaluation

## 2016-02-11 NOTE — Anesthesia Postprocedure Evaluation (Signed)
Anesthesia Post Note  Patient: Sarah [name]  Procedure(s) Performed: Procedure(s) (LRB): ESOPHAGOGASTRODUODENOSCOPY (EGD) WITH PROPOFOL (N/A) ESOPHAGEAL DILATION (N/A)  Patient location during evaluation: PACU Anesthesia Type: MAC Level of consciousness: awake Pain management: pain level controlled Vital Signs Assessment: post-procedure vital signs reviewed and stable Respiratory status: spontaneous breathing Cardiovascular status: stable Postop Assessment: no headache Anesthetic complications: no    Verner Cholunkle, III,  Kaiyah Eber D

## 2016-02-12 ENCOUNTER — Encounter: Payer: Self-pay | Admitting: Gastroenterology

## 2016-02-13 ENCOUNTER — Encounter: Payer: Self-pay | Admitting: Gastroenterology

## 2016-02-14 ENCOUNTER — Encounter: Payer: Self-pay | Admitting: Gastroenterology

## 2016-04-04 DIAGNOSIS — E782 Mixed hyperlipidemia: Secondary | ICD-10-CM | POA: Insufficient documentation

## 2016-04-04 DIAGNOSIS — I10 Essential (primary) hypertension: Secondary | ICD-10-CM | POA: Insufficient documentation

## 2016-04-29 ENCOUNTER — Ambulatory Visit (INDEPENDENT_AMBULATORY_CARE_PROVIDER_SITE_OTHER): Payer: BLUE CROSS/BLUE SHIELD | Admitting: *Deleted

## 2016-04-29 DIAGNOSIS — J439 Emphysema, unspecified: Secondary | ICD-10-CM

## 2016-04-29 DIAGNOSIS — R0609 Other forms of dyspnea: Secondary | ICD-10-CM

## 2016-04-29 DIAGNOSIS — R0600 Dyspnea, unspecified: Secondary | ICD-10-CM

## 2016-04-29 LAB — PULMONARY FUNCTION TEST
DL/VA % pred: 99 %
DL/VA: 4.64 ml/min/mmHg/L
DLCO unc % pred: 78 %
DLCO unc: 18.09 ml/min/mmHg
FEF 25-75 Post: 1.44 L/sec
FEF 25-75 Pre: 1.36 L/sec
FEF2575-%Change-Post: 5 %
FEF2575-%Pred-Post: 78 %
FEF2575-%Pred-Pre: 74 %
FEV1-%Change-Post: 4 %
FEV1-%Pred-Post: 87 %
FEV1-%Pred-Pre: 83 %
FEV1-Post: 1.67 L
FEV1-Pre: 1.59 L
FEV1FVC-%Change-Post: 10 %
FEV1FVC-%Pred-Pre: 84 %
FEV6-%Change-Post: -6 %
FEV6-%Pred-Post: 95 %
FEV6-%Pred-Pre: 101 %
FEV6-Post: 2.24 L
FEV6-Pre: 2.39 L
FEV6FVC-%Pred-Post: 104 %
FEV6FVC-%Pred-Pre: 104 %
FVC-%Change-Post: -5 %
FVC-%Pred-Post: 92 %
FVC-%Pred-Pre: 98 %
FVC-Post: 2.26 L
FVC-Pre: 2.39 L
Post FEV1/FVC ratio: 74 %
Post FEV6/FVC ratio: 100 %
Pre FEV1/FVC ratio: 67 %
Pre FEV6/FVC Ratio: 100 %

## 2016-04-29 NOTE — Progress Notes (Signed)
SMW performed today. 

## 2016-04-29 NOTE — Progress Notes (Signed)
PFT performed today with nitrogen washout. 

## 2016-05-05 ENCOUNTER — Ambulatory Visit (INDEPENDENT_AMBULATORY_CARE_PROVIDER_SITE_OTHER): Payer: BLUE CROSS/BLUE SHIELD | Admitting: Internal Medicine

## 2016-05-05 ENCOUNTER — Encounter: Payer: Self-pay | Admitting: Internal Medicine

## 2016-05-05 VITALS — BP 128/82 | HR 72 | Ht 63.0 in | Wt 251.0 lb

## 2016-05-05 DIAGNOSIS — J439 Emphysema, unspecified: Secondary | ICD-10-CM

## 2016-05-05 NOTE — Patient Instructions (Signed)
Follow up with Dr. Ardyth Manam in 3 months - cont with current inhalers - cont with diet and exercises

## 2016-05-05 NOTE — Progress Notes (Addendum)
MRN# [id number] Sarah FrameGladys O Shepard [birth year]   CC: Chief Complaint  Patient presents with  . Follow-up    SMW &PFT results:       Brief History: Synopsis - 65 year old female originally seen by pulmonary for shortness of breath and dyspnea on exertion. Diagnosed with COPD, moderate obstruction, but with mild clinical symptoms, placed on Spiriva.  Events since last clinic visit: She presents today for follow-up visit of COPD type A, with moderate obstruction on spirometry, mild clinical symptoms. She is accompanied by her boyfriend today. She states that she does not do much exercising over the fall and winter. She does endorse some intermittent cough and mild intermittent sputum production, otherwise doing well since on Spiriva. No worsening dyspnea on exertion.     Medication:    Current Outpatient Prescriptions:  .  acetaminophen (TYLENOL) 500 MG tablet, Take 500 mg by mouth as needed., Disp: , Rfl:  .  aspirin 81 MG tablet, Take 81 mg by mouth daily., Disp: , Rfl:  .  carvedilol (COREG) 12.5 MG tablet, Take 12.5 mg by mouth 2 (two) times daily with a meal. Reported on 10/08/2015, Disp: , Rfl:  .  furosemide (LASIX) 20 MG tablet, Take 20 mg by mouth daily., Disp: , Rfl:  .  isosorbide dinitrate (ISORDIL) 30 MG tablet, Take 30 mg by mouth daily., Disp: , Rfl:  .  losartan (COZAAR) 50 MG tablet, Take 50 mg by mouth daily., Disp: , Rfl:  .  metFORMIN (GLUCOPHAGE) 500 MG tablet, , Disp: , Rfl: 0 .  Multiple Vitamins-Minerals (CENTRUM PO), Take by mouth daily., Disp: , Rfl:  .  naproxen (NAPROSYN) 375 MG tablet, Take 375 mg by mouth 3 (three) times daily with meals., Disp: , Rfl:  .  omeprazole (PRILOSEC) 20 MG capsule, Take 20 mg by mouth daily. , Disp: , Rfl:  .  PROAIR HFA 108 (90 Base) MCG/ACT inhaler, INHALE TWO PUFFS BY MOUTH EVERY 2 TO 4 HOURS AS NEEDED, Disp: 1 each, Rfl: 0 .  simvastatin (ZOCOR) 40 MG tablet, Take 40 mg by mouth daily., Disp: , Rfl:  .  SPIRIVA HANDIHALER 18  MCG inhalation capsule, INHALE ONE DOSE BY MOUTH ONCE DAILY, Disp: 30 capsule, Rfl: 0    Review of Systems: Gen:  Denies  fever, sweats, chills HEENT: Denies blurred vision, double vision, ear pain, eye pain, hearing loss, nose bleeds, sore throat Cvc:  No dizziness, chest pain or heaviness Resp:   Admits GL:OVFIto:Mild cough and intermittent white sputum production Gi: Denies swallowing difficulty, stomach pain, nausea or vomiting, diarrhea, constipation, bowel incontinence Gu:  Denies bladder incontinence, burning urine Ext:   No Joint pain, stiffness or swelling Skin: No skin rash, easy bruising or bleeding or hives Endoc:  No polyuria, polydipsia , polyphagia or weight change Other:  All other systems negative  Allergies:  Patient has no known allergies.  Physical Examination:  VS: Ht 5\' 3"  (1.6 m)   Wt 251 lb (113.9 kg)   BMI 44.46 kg/m   General Appearance: No distress  HEENT: PERRLA, no ptosis, no other lesions noticed Pulmonary:normal breath sounds., diaphragmatic excursion normal.No wheezing, No rales   Cardiovascular:  Normal S1,S2.  No m/r/g.     Abdomen:Exam: Benign, Soft, non-tender, No masses  Skin:   warm, no rashes, no ecchymosis  Extremities: normal, no cyanosis, clubbing, warm with normal capillary refill.    Pulmonary function tests 04/29/2016 FVC 98% FEV1 82% FEV1/FVC 67% FRC 51% RV 57% TLC 71% RV/TLC 81% ERV  71% DLCO 70% Impression: Mild obstruction, moderate restriction, mixed obstruction and restriction noted.   6MWT - total distance 994 feet/303 m, low saturation 98%, highest heart rate 87  Assessment and Plan: 65 year old seen in follow-up visit for COPD COPD type A COPD Type A Level of obstruction - Moderate Symptoms - mild  Plan - continue with Spiriva  - patient educated on use and administration of inhaler, along with the side effects - if no improvement or worsening symptoms, will plan to add Advair        Updated Medication  List Outpatient Encounter Prescriptions as of 05/05/2016  Medication Sig  . acetaminophen (TYLENOL) 500 MG tablet Take 500 mg by mouth as needed.  Marland Kitchen. aspirin 81 MG tablet Take 81 mg by mouth daily.  . carvedilol (COREG) 12.5 MG tablet Take 12.5 mg by mouth 2 (two) times daily with a meal. Reported on 10/08/2015  . furosemide (LASIX) 20 MG tablet Take 20 mg by mouth daily.  Marland Kitchen. losartan (COZAAR) 50 MG tablet Take 50 mg by mouth daily.  . metFORMIN (GLUCOPHAGE) 500 MG tablet   . Multiple Vitamins-Minerals (CENTRUM PO) Take by mouth daily.  Marland Kitchen. omeprazole (PRILOSEC) 20 MG capsule Take 20 mg by mouth daily.   Marland Kitchen. PROAIR HFA 108 (90 Base) MCG/ACT inhaler INHALE TWO PUFFS BY MOUTH EVERY 2 TO 4 HOURS AS NEEDED  . simvastatin (ZOCOR) 40 MG tablet Take 40 mg by mouth daily.  Marland Kitchen. SPIRIVA HANDIHALER 18 MCG inhalation capsule INHALE ONE DOSE BY MOUTH ONCE DAILY   No facility-administered encounter medications on file as of 05/05/2016.     Orders for this visit: No orders of the defined types were placed in this encounter.   Thank  you for the visitation and for allowing  [facility] to assist in the care of your patient. Our recommendations are noted above.  Please contact us if we can be of further service.  Stephanie AcreVishal Ayansh Feutz, MD [facility] Office Number: [phone]

## 2016-05-05 NOTE — Assessment & Plan Note (Signed)
COPD Type A Level of obstruction - Moderate Symptoms - mild  Plan - continue with Spiriva  - patient educated on use and administration of inhaler, along with the side effects - if no improvement or worsening symptoms, will plan to add Advair

## 2016-05-12 DIAGNOSIS — E119 Type 2 diabetes mellitus without complications: Secondary | ICD-10-CM | POA: Insufficient documentation

## 2016-05-12 DIAGNOSIS — I5032 Chronic diastolic (congestive) heart failure: Secondary | ICD-10-CM | POA: Insufficient documentation

## 2016-06-02 ENCOUNTER — Ambulatory Visit: Payer: BLUE CROSS/BLUE SHIELD

## 2016-07-18 ENCOUNTER — Other Ambulatory Visit: Payer: Self-pay | Admitting: Internal Medicine

## 2016-07-18 DIAGNOSIS — Z1231 Encounter for screening mammogram for malignant neoplasm of breast: Secondary | ICD-10-CM

## 2016-10-06 ENCOUNTER — Ambulatory Visit: Payer: BLUE CROSS/BLUE SHIELD

## 2016-10-20 ENCOUNTER — Ambulatory Visit: Payer: BLUE CROSS/BLUE SHIELD | Admitting: Internal Medicine

## 2016-10-27 ENCOUNTER — Ambulatory Visit
Admission: RE | Admit: 2016-10-27 | Discharge: 2016-10-27 | Disposition: A | Discharge status: Home / Self Care | Payer: BLUE CROSS/BLUE SHIELD | Source: Ambulatory Visit | Attending: Internal Medicine | Admitting: Internal Medicine

## 2016-10-27 DIAGNOSIS — Z1231 Encounter for screening mammogram for malignant neoplasm of breast: Secondary | ICD-10-CM | POA: Diagnosis not present

## 2016-12-03 NOTE — Progress Notes (Signed)
MRN# [id number] Sarah FrameGladys O [name] 1951/02/16    COPD. -Baseline symptoms appear to be well-controlled on Spiriva. Will continue.  Dyspnea on exertion.  -She has occasional symptoms with strenuous activity, particularly with sexual activity. --She take 2 puffs of albuterol before strenuous activity. -We also discussed increasing her treadmill speed, and using her treadmill more regularly to increase her physical stamina. She can take 1-2 puffs of albuterol before using the treadmill.  Obesity -We discussed that this can contribute to her dyspnea, weight loss would be beneficial.   History: Patient is a 66 year old female originally seen  for shortness of breath and dyspnea on exertion. Diagnosed with COPD, moderate obstruction, but with mild clinical symptoms, placed on Spiriva. She continues to use spiriva daily. She occasionally uses albuterol as needed. She noted that she got very winded with sexual activity recently, she took one puff before hand.   She is walking in the am, she does occasional treadmill.   Pulmonary function tests 04/29/2016 FVC 98% FEV1 82% FEV1/FVC 67% FRC 51% RV 57% TLC 71% RV/TLC 81% ERV 71% DLCO 70% Impression: Mild obstruction, moderate restriction, mixed obstruction and restriction noted.  6MWT - total distance 994 feet/303 m, low saturation 98%, highest heart rate 87  Medication:    Current Outpatient Prescriptions:  .  acetaminophen (TYLENOL) 500 MG tablet, Take 500 mg by mouth as needed., Disp: , Rfl:  .  aspirin 81 MG tablet, Take 81 mg by mouth daily., Disp: , Rfl:  .  carvedilol (COREG) 12.5 MG tablet, Take 12.5 mg by mouth 2 (two) times daily with a meal. Reported on 10/08/2015, Disp: , Rfl:  .  furosemide (LASIX) 20 MG tablet, Take 20 mg by mouth daily., Disp: , Rfl:  .  isosorbide dinitrate (ISORDIL) 30 MG tablet, Take 30 mg by mouth daily., Disp: , Rfl:  .  losartan (COZAAR) 50 MG tablet, Take 50 mg by mouth daily., Disp: , Rfl:  .   metFORMIN (GLUCOPHAGE) 500 MG tablet, , Disp: , Rfl: 0 .  Multiple Vitamins-Minerals (CENTRUM PO), Take by mouth daily., Disp: , Rfl:  .  naproxen (NAPROSYN) 375 MG tablet, Take 375 mg by mouth 3 (three) times daily with meals., Disp: , Rfl:  .  omeprazole (PRILOSEC) 20 MG capsule, Take 20 mg by mouth daily. , Disp: , Rfl:  .  PROAIR HFA 108 (90 Base) MCG/ACT inhaler, INHALE TWO PUFFS BY MOUTH EVERY 2 TO 4 HOURS AS NEEDED, Disp: 1 each, Rfl: 0 .  simvastatin (ZOCOR) 40 MG tablet, Take 40 mg by mouth daily., Disp: , Rfl:  .  SPIRIVA HANDIHALER 18 MCG inhalation capsule, INHALE ONE DOSE BY MOUTH ONCE DAILY, Disp: 30 capsule, Rfl: 0    Review of Systems: Gen:  Denies  fever, sweats, chills HEENT: Denies blurred vision, double vision, ear pain, eye pain, hearing loss, nose bleeds, sore throat Cvc:  No dizziness, chest pain or heaviness Resp:   Admits WU:JWJXto:Mild cough and intermittent white sputum production Gi: Denies swallowing difficulty, stomach pain, nausea or vomiting, diarrhea, constipation, bowel incontinence Gu:  Denies bladder incontinence, burning urine Ext:   No Joint pain, stiffness or swelling Skin: No skin rash, easy bruising or bleeding or hives Endoc:  No polyuria, polydipsia , polyphagia or weight change Other:  All other systems negative  Allergies:  Patient has no known allergies.  Physical Examination:  VS: BP 118/80 (BP Location: Left Arm, Cuff Size: Large)   Pulse 74   Resp 16   Ht 5'  3" (1.6 m)   Wt 253 lb (114.8 kg)   SpO2 96%   BMI 44.82 kg/m   General Appearance: No distress  HEENT: PERRLA, no ptosis, no other lesions noticed Pulmonary:normal breath sounds., diaphragmatic excursion normal.No wheezing, No rales   Cardiovascular:  Normal S1,S2.  No m/r/g.     Abdomen:Exam: Benign, Soft, non-tender, No masses  Skin:   warm, no rashes, no ecchymosis  Extremities: normal, no cyanosis, clubbing, warm with normal capillary refill.         Updated Medication  List Outpatient Encounter Prescriptions as of 12/08/2016  Medication Sig  . acetaminophen (TYLENOL) 500 MG tablet Take 500 mg by mouth as needed.  Marland Kitchen aspirin 81 MG tablet Take 81 mg by mouth daily.  . carvedilol (COREG) 12.5 MG tablet Take 12.5 mg by mouth 2 (two) times daily with a meal. Reported on 10/08/2015  . furosemide (LASIX) 20 MG tablet Take 20 mg by mouth daily.  . isosorbide dinitrate (ISORDIL) 30 MG tablet Take 30 mg by mouth daily.  Marland Kitchen losartan (COZAAR) 50 MG tablet Take 50 mg by mouth daily.  . metFORMIN (GLUCOPHAGE) 500 MG tablet   . Multiple Vitamins-Minerals (CENTRUM PO) Take by mouth daily.  . naproxen (NAPROSYN) 375 MG tablet Take 375 mg by mouth 3 (three) times daily with meals.  Marland Kitchen omeprazole (PRILOSEC) 20 MG capsule Take 20 mg by mouth daily.   Marland Kitchen PROAIR HFA 108 (90 Base) MCG/ACT inhaler INHALE TWO PUFFS BY MOUTH EVERY 2 TO 4 HOURS AS NEEDED  . simvastatin (ZOCOR) 40 MG tablet Take 40 mg by mouth daily.  Marland Kitchen SPIRIVA HANDIHALER 18 MCG inhalation capsule INHALE ONE DOSE BY MOUTH ONCE DAILY   No facility-administered encounter medications on file as of 12/08/2016.     Thank  you for the visitation and for allowing  [facility] to assist in the care of your patient. Our recommendations are noted above.  Please contact us if we can be of further service.  Wells Guiles, MD [facility] Office Number: [phone]

## 2016-12-08 ENCOUNTER — Encounter: Payer: Self-pay | Admitting: Internal Medicine

## 2016-12-08 ENCOUNTER — Ambulatory Visit (INDEPENDENT_AMBULATORY_CARE_PROVIDER_SITE_OTHER): Payer: BLUE CROSS/BLUE SHIELD | Admitting: Internal Medicine

## 2016-12-08 VITALS — BP 118/80 | HR 74 | Resp 16 | Ht 63.0 in | Wt 253.0 lb

## 2016-12-08 DIAGNOSIS — R0609 Other forms of dyspnea: Secondary | ICD-10-CM

## 2016-12-08 DIAGNOSIS — J439 Emphysema, unspecified: Secondary | ICD-10-CM | POA: Diagnosis not present

## 2016-12-08 DIAGNOSIS — R0600 Dyspnea, unspecified: Secondary | ICD-10-CM

## 2016-12-08 NOTE — Patient Instructions (Signed)
--  Take one puff of albuterol, and get on your treadmill each morning. Increase your treadmill speed to increase you dyspnea slightly and go for 25 minutes.

## 2017-02-11 DIAGNOSIS — I872 Venous insufficiency (chronic) (peripheral): Secondary | ICD-10-CM | POA: Insufficient documentation

## 2017-04-06 DIAGNOSIS — Z23 Encounter for immunization: Secondary | ICD-10-CM | POA: Diagnosis not present

## 2017-05-15 DIAGNOSIS — K625 Hemorrhage of anus and rectum: Secondary | ICD-10-CM | POA: Diagnosis not present

## 2017-05-15 DIAGNOSIS — A000 Cholera due to Vibrio cholerae 01, biovar cholerae: Secondary | ICD-10-CM | POA: Diagnosis not present

## 2017-05-15 DIAGNOSIS — M25512 Pain in left shoulder: Secondary | ICD-10-CM | POA: Diagnosis not present

## 2017-05-15 DIAGNOSIS — J449 Chronic obstructive pulmonary disease, unspecified: Secondary | ICD-10-CM | POA: Diagnosis not present

## 2017-05-15 DIAGNOSIS — E785 Hyperlipidemia, unspecified: Secondary | ICD-10-CM | POA: Diagnosis not present

## 2017-05-15 DIAGNOSIS — E119 Type 2 diabetes mellitus without complications: Secondary | ICD-10-CM | POA: Diagnosis not present

## 2017-05-15 DIAGNOSIS — E559 Vitamin D deficiency, unspecified: Secondary | ICD-10-CM | POA: Diagnosis not present

## 2017-06-23 DIAGNOSIS — M25562 Pain in left knee: Secondary | ICD-10-CM | POA: Diagnosis not present

## 2017-07-09 DIAGNOSIS — M1710 Unilateral primary osteoarthritis, unspecified knee: Secondary | ICD-10-CM | POA: Insufficient documentation

## 2017-07-09 DIAGNOSIS — M179 Osteoarthritis of knee, unspecified: Secondary | ICD-10-CM | POA: Insufficient documentation

## 2017-07-09 DIAGNOSIS — M1711 Unilateral primary osteoarthritis, right knee: Secondary | ICD-10-CM | POA: Diagnosis not present

## 2017-07-09 DIAGNOSIS — M222X9 Patellofemoral disorders, unspecified knee: Secondary | ICD-10-CM | POA: Insufficient documentation

## 2017-07-09 DIAGNOSIS — M170 Bilateral primary osteoarthritis of knee: Secondary | ICD-10-CM | POA: Diagnosis not present

## 2017-07-09 DIAGNOSIS — M222X2 Patellofemoral disorders, left knee: Secondary | ICD-10-CM | POA: Diagnosis not present

## 2017-07-31 DIAGNOSIS — M6281 Muscle weakness (generalized): Secondary | ICD-10-CM | POA: Diagnosis not present

## 2017-07-31 DIAGNOSIS — M25562 Pain in left knee: Secondary | ICD-10-CM | POA: Diagnosis not present

## 2017-07-31 DIAGNOSIS — M25561 Pain in right knee: Secondary | ICD-10-CM | POA: Diagnosis not present

## 2017-07-31 DIAGNOSIS — R609 Edema, unspecified: Secondary | ICD-10-CM | POA: Diagnosis not present

## 2017-08-07 DIAGNOSIS — R609 Edema, unspecified: Secondary | ICD-10-CM | POA: Diagnosis not present

## 2017-08-07 DIAGNOSIS — M25561 Pain in right knee: Secondary | ICD-10-CM | POA: Diagnosis not present

## 2017-08-07 DIAGNOSIS — M25562 Pain in left knee: Secondary | ICD-10-CM | POA: Diagnosis not present

## 2017-08-07 DIAGNOSIS — M6281 Muscle weakness (generalized): Secondary | ICD-10-CM | POA: Diagnosis not present

## 2017-08-11 DIAGNOSIS — H6692 Otitis media, unspecified, left ear: Secondary | ICD-10-CM | POA: Diagnosis not present

## 2017-08-14 DIAGNOSIS — I10 Essential (primary) hypertension: Secondary | ICD-10-CM | POA: Diagnosis not present

## 2017-08-14 DIAGNOSIS — M25562 Pain in left knee: Secondary | ICD-10-CM | POA: Diagnosis not present

## 2017-08-14 DIAGNOSIS — E782 Mixed hyperlipidemia: Secondary | ICD-10-CM | POA: Diagnosis not present

## 2017-08-14 DIAGNOSIS — I5032 Chronic diastolic (congestive) heart failure: Secondary | ICD-10-CM | POA: Diagnosis not present

## 2017-08-14 DIAGNOSIS — E119 Type 2 diabetes mellitus without complications: Secondary | ICD-10-CM | POA: Diagnosis not present

## 2017-08-14 DIAGNOSIS — R609 Edema, unspecified: Secondary | ICD-10-CM | POA: Diagnosis not present

## 2017-08-14 DIAGNOSIS — I872 Venous insufficiency (chronic) (peripheral): Secondary | ICD-10-CM | POA: Diagnosis not present

## 2017-08-14 DIAGNOSIS — M25561 Pain in right knee: Secondary | ICD-10-CM | POA: Diagnosis not present

## 2017-08-14 DIAGNOSIS — R0789 Other chest pain: Secondary | ICD-10-CM | POA: Diagnosis not present

## 2017-08-14 DIAGNOSIS — M6281 Muscle weakness (generalized): Secondary | ICD-10-CM | POA: Diagnosis not present

## 2017-08-17 DIAGNOSIS — R739 Hyperglycemia, unspecified: Secondary | ICD-10-CM | POA: Diagnosis not present

## 2017-08-17 DIAGNOSIS — M129 Arthropathy, unspecified: Secondary | ICD-10-CM | POA: Diagnosis not present

## 2017-08-17 DIAGNOSIS — M6281 Muscle weakness (generalized): Secondary | ICD-10-CM | POA: Diagnosis not present

## 2017-08-17 DIAGNOSIS — Z0000 Encounter for general adult medical examination without abnormal findings: Secondary | ICD-10-CM | POA: Diagnosis not present

## 2017-08-17 DIAGNOSIS — M25562 Pain in left knee: Secondary | ICD-10-CM | POA: Diagnosis not present

## 2017-08-17 DIAGNOSIS — I10 Essential (primary) hypertension: Secondary | ICD-10-CM | POA: Diagnosis not present

## 2017-08-17 DIAGNOSIS — E559 Vitamin D deficiency, unspecified: Secondary | ICD-10-CM | POA: Diagnosis not present

## 2017-08-17 DIAGNOSIS — I509 Heart failure, unspecified: Secondary | ICD-10-CM | POA: Diagnosis not present

## 2017-08-17 DIAGNOSIS — M25561 Pain in right knee: Secondary | ICD-10-CM | POA: Diagnosis not present

## 2017-08-17 DIAGNOSIS — J449 Chronic obstructive pulmonary disease, unspecified: Secondary | ICD-10-CM | POA: Diagnosis not present

## 2017-08-17 DIAGNOSIS — R609 Edema, unspecified: Secondary | ICD-10-CM | POA: Diagnosis not present

## 2017-08-21 DIAGNOSIS — M25561 Pain in right knee: Secondary | ICD-10-CM | POA: Diagnosis not present

## 2017-08-21 DIAGNOSIS — M6281 Muscle weakness (generalized): Secondary | ICD-10-CM | POA: Diagnosis not present

## 2017-08-21 DIAGNOSIS — M25562 Pain in left knee: Secondary | ICD-10-CM | POA: Diagnosis not present

## 2017-08-21 DIAGNOSIS — R609 Edema, unspecified: Secondary | ICD-10-CM | POA: Diagnosis not present

## 2017-08-24 DIAGNOSIS — R609 Edema, unspecified: Secondary | ICD-10-CM | POA: Diagnosis not present

## 2017-08-24 DIAGNOSIS — M25561 Pain in right knee: Secondary | ICD-10-CM | POA: Diagnosis not present

## 2017-08-24 DIAGNOSIS — M170 Bilateral primary osteoarthritis of knee: Secondary | ICD-10-CM | POA: Diagnosis not present

## 2017-08-24 DIAGNOSIS — M222X9 Patellofemoral disorders, unspecified knee: Secondary | ICD-10-CM | POA: Diagnosis not present

## 2017-08-24 DIAGNOSIS — M25562 Pain in left knee: Secondary | ICD-10-CM | POA: Diagnosis not present

## 2017-08-24 DIAGNOSIS — M6281 Muscle weakness (generalized): Secondary | ICD-10-CM | POA: Diagnosis not present

## 2017-10-04 NOTE — Progress Notes (Signed)
MRN# [id number] Sarah [name] 10-07-1950    COPD. -Baseline symptoms appear to be well-controlled on Spiriva. Will continue.  Dyspnea on exertion.  -She has occasional symptoms with strenuous activity, particularly with sexual activity which seems to help. --Continue albuterol as needed.   Obesity/Deconditioning.  -We discussed that this can contribute to her dyspnea, weight loss would be beneficial.   History: Patient is a 67 year old female originally seen  for shortness of breath and dyspnea on exertion. Diagnosed with COPD, moderate obstruction, but with mild clinical symptoms, placed on Spiriva. She continues to use spiriva daily. At last visit it was noted that she was getting winded with activity, she was advised to take albuterol before strenuous activity, increase her exercise level.  She has begun taking albuterol before sex, which helped, however it happened again in the past few weeks. She took some extra lasix which .   She has been having knee pain and is going through PT so she is not doing treadmill.   Pulmonary function tests 04/29/2016 FVC 98% FEV1 82% FEV1/FVC 67% FRC 51% RV 57% TLC 71% RV/TLC 81% ERV 71% DLCO 70% Impression: Mild obstruction, moderate restriction, mixed obstruction and restriction noted.  - total distance 994 feet/303 m, low saturation 98%, highest heart rate 87  Medication:    Current Outpatient Medications:  .  acetaminophen (TYLENOL) 500 MG tablet, Take 500 mg by mouth as needed., Disp: , Rfl:  .  aspirin 81 MG tablet, Take 81 mg by mouth daily., Disp: , Rfl:  .  carvedilol (COREG) 12.5 MG tablet, Take 12.5 mg by mouth 2 (two) times daily with a meal. Reported on 10/08/2015, Disp: , Rfl:  .  dimenhyDRINATE (DRAMAMINE) 50 MG tablet, Take 25 mg by mouth every 8 (eight) hours as needed., Disp: , Rfl:  .  furosemide (LASIX) 20 MG tablet, Take 20 mg by mouth daily., Disp: , Rfl:  .  isosorbide dinitrate (ISORDIL) 30 MG tablet, Take  30 mg by mouth daily., Disp: , Rfl:  .  losartan (COZAAR) 50 MG tablet, Take 50 mg by mouth daily., Disp: , Rfl:  .  metFORMIN (GLUCOPHAGE) 500 MG tablet, , Disp: , Rfl: 0 .  Multiple Vitamins-Minerals (CENTRUM PO), Take by mouth daily., Disp: , Rfl:  .  naproxen (NAPROSYN) 375 MG tablet, Take 375 mg by mouth as needed. , Disp: , Rfl:  .  omeprazole (PRILOSEC) 20 MG capsule, Take 20 mg by mouth daily. , Disp: , Rfl:  .  PROAIR HFA 108 (90 Base) MCG/ACT inhaler, INHALE TWO PUFFS BY MOUTH EVERY 2 TO 4 HOURS AS NEEDED, Disp: 1 each, Rfl: 0 .  simvastatin (ZOCOR) 40 MG tablet, Take 40 mg by mouth daily., Disp: , Rfl:  .  SPIRIVA HANDIHALER 18 MCG inhalation capsule, INHALE ONE DOSE BY MOUTH ONCE DAILY, Disp: 30 capsule, Rfl: 0    Review of Systems: Gen:  Denies  fever, sweats, chills HEENT: Denies blurred vision, double vision, ear pain, eye pain, hearing loss, nose bleeds, sore throat Cvc:  No dizziness, chest pain or heaviness Resp:   Admits [id number] cough and intermittent white sputum production Gi: Denies swallowing difficulty, stomach pain, nausea or vomiting, diarrhea, constipation, bowel incontinence Gu:  Denies bladder incontinence, burning urine Ext:   No Joint pain, stiffness or swelling Skin: No skin rash, easy bruising or bleeding or hives Endoc:  No polyuria, polydipsia , polyphagia or weight change Other:  All other systems negative  Allergies:  Patient has no known  allergies.  Physical Examination:  VS: BP 118/78 (BP Location: Left Arm, Cuff Size: Normal)   Pulse 87   Ht 5\' 3"  (1.6 m)   Wt 253 lb (114.8 kg)   SpO2 98%   BMI 44.82 kg/m   General Appearance: No distress  HEENT: PERRLA, no ptosis, no other lesions noticed Pulmonary:normal breath sounds., diaphragmatic excursion normal.No wheezing, No rales   Cardiovascular:  Normal S1,S2.  No m/r/g.     Abdomen:Exam: Benign, Soft, non-tender, No masses  Skin:   warm, no rashes, no ecchymosis  Extremities: normal, no  cyanosis, clubbing, warm with normal capillary refill.         Updated Medication List Outpatient Encounter Medications as of 10/05/2017  Medication Sig  . acetaminophen (TYLENOL) 500 MG tablet Take 500 mg by mouth as needed.  Marland Kitchen. aspirin 81 MG tablet Take 81 mg by mouth daily.  . carvedilol (COREG) 12.5 MG tablet Take 12.5 mg by mouth 2 (two) times daily with a meal. Reported on 10/08/2015  . dimenhyDRINATE (DRAMAMINE) 50 MG tablet Take 25 mg by mouth every 8 (eight) hours as needed.  . furosemide (LASIX) 20 MG tablet Take 20 mg by mouth daily.  . isosorbide dinitrate (ISORDIL) 30 MG tablet Take 30 mg by mouth daily.  Marland Kitchen. losartan (COZAAR) 50 MG tablet Take 50 mg by mouth daily.  . metFORMIN (GLUCOPHAGE) 500 MG tablet   . Multiple Vitamins-Minerals (CENTRUM PO) Take by mouth daily.  . naproxen (NAPROSYN) 375 MG tablet Take 375 mg by mouth as needed.   Marland Kitchen. omeprazole (PRILOSEC) 20 MG capsule Take 20 mg by mouth daily.   Marland Kitchen. PROAIR HFA 108 (90 Base) MCG/ACT inhaler INHALE TWO PUFFS BY MOUTH EVERY 2 TO 4 HOURS AS NEEDED  . simvastatin (ZOCOR) 40 MG tablet Take 40 mg by mouth daily.  Marland Kitchen. SPIRIVA HANDIHALER 18 MCG inhalation capsule INHALE ONE DOSE BY MOUTH ONCE DAILY   No facility-administered encounter medications on file as of 10/05/2017.     Thank  you for the visitation and for allowing  [facility] to assist in the care of your patient. Our recommendations are noted above.  Please contact us if we can be of further service.  Wells Guileseep Trentin Knappenberger, MD [facility] Office Number: [phone]

## 2017-10-05 ENCOUNTER — Ambulatory Visit (INDEPENDENT_AMBULATORY_CARE_PROVIDER_SITE_OTHER): Payer: PPO | Admitting: Internal Medicine

## 2017-10-05 ENCOUNTER — Encounter: Payer: Self-pay | Admitting: Internal Medicine

## 2017-10-05 ENCOUNTER — Telehealth: Payer: Self-pay | Admitting: Internal Medicine

## 2017-10-05 VITALS — BP 118/78 | HR 87 | Ht 63.0 in | Wt 253.0 lb

## 2017-10-05 DIAGNOSIS — J439 Emphysema, unspecified: Secondary | ICD-10-CM | POA: Diagnosis not present

## 2017-10-05 NOTE — Telephone Encounter (Signed)
Pt seen today. Please advise on message below. 

## 2017-10-05 NOTE — Telephone Encounter (Signed)
Pt would like something to be called in for her cold. Headache, runny nose, sore throat, scratchy throat.

## 2017-10-05 NOTE — Patient Instructions (Signed)
Continue spiriva and use albuterol when needed.

## 2017-10-06 NOTE — Telephone Encounter (Signed)
Pt informed of results. Informed she could try some Tylenol Cold OTC. Pt verbalized understanding. Nothing further needed.

## 2017-10-06 NOTE — Telephone Encounter (Signed)
Would treat this with OTC cold and pain medications-- unnecessary antiobiotics and steroids can be detrimental. If if does not improve by next week, or if it goes to your lungs let us know.

## 2017-10-08 DIAGNOSIS — J40 Bronchitis, not specified as acute or chronic: Secondary | ICD-10-CM | POA: Diagnosis not present

## 2017-11-26 ENCOUNTER — Other Ambulatory Visit: Payer: Self-pay | Admitting: Internal Medicine

## 2017-11-26 DIAGNOSIS — Z1231 Encounter for screening mammogram for malignant neoplasm of breast: Secondary | ICD-10-CM

## 2017-12-07 DIAGNOSIS — R079 Chest pain, unspecified: Secondary | ICD-10-CM | POA: Diagnosis not present

## 2017-12-07 DIAGNOSIS — I509 Heart failure, unspecified: Secondary | ICD-10-CM | POA: Diagnosis not present

## 2017-12-07 DIAGNOSIS — I10 Essential (primary) hypertension: Secondary | ICD-10-CM | POA: Diagnosis not present

## 2017-12-07 DIAGNOSIS — M25562 Pain in left knee: Secondary | ICD-10-CM | POA: Diagnosis not present

## 2017-12-07 DIAGNOSIS — J449 Chronic obstructive pulmonary disease, unspecified: Secondary | ICD-10-CM | POA: Diagnosis not present

## 2017-12-07 DIAGNOSIS — R0600 Dyspnea, unspecified: Secondary | ICD-10-CM | POA: Diagnosis not present

## 2017-12-07 DIAGNOSIS — R739 Hyperglycemia, unspecified: Secondary | ICD-10-CM | POA: Diagnosis not present

## 2017-12-21 DIAGNOSIS — H524 Presbyopia: Secondary | ICD-10-CM | POA: Diagnosis not present

## 2017-12-21 DIAGNOSIS — H40153 Residual stage of open-angle glaucoma, bilateral: Secondary | ICD-10-CM | POA: Diagnosis not present

## 2018-01-28 ENCOUNTER — Encounter: Payer: Self-pay | Admitting: Emergency Medicine

## 2018-01-28 ENCOUNTER — Emergency Department: Payer: PPO

## 2018-01-28 ENCOUNTER — Emergency Department
Admission: EM | Admit: 2018-01-28 | Discharge: 2018-01-28 | Disposition: A | Discharge status: Home / Self Care | Payer: PPO | Attending: Emergency Medicine | Admitting: Emergency Medicine

## 2018-01-28 ENCOUNTER — Other Ambulatory Visit: Payer: Self-pay

## 2018-01-28 DIAGNOSIS — Z87891 Personal history of nicotine dependence: Secondary | ICD-10-CM | POA: Diagnosis not present

## 2018-01-28 DIAGNOSIS — I2510 Atherosclerotic heart disease of native coronary artery without angina pectoris: Secondary | ICD-10-CM | POA: Diagnosis not present

## 2018-01-28 DIAGNOSIS — Z7984 Long term (current) use of oral hypoglycemic drugs: Secondary | ICD-10-CM | POA: Diagnosis not present

## 2018-01-28 DIAGNOSIS — Z79899 Other long term (current) drug therapy: Secondary | ICD-10-CM | POA: Insufficient documentation

## 2018-01-28 DIAGNOSIS — R0789 Other chest pain: Secondary | ICD-10-CM | POA: Insufficient documentation

## 2018-01-28 DIAGNOSIS — I5032 Chronic diastolic (congestive) heart failure: Secondary | ICD-10-CM | POA: Diagnosis not present

## 2018-01-28 DIAGNOSIS — E119 Type 2 diabetes mellitus without complications: Secondary | ICD-10-CM | POA: Diagnosis not present

## 2018-01-28 DIAGNOSIS — R079 Chest pain, unspecified: Secondary | ICD-10-CM | POA: Diagnosis not present

## 2018-01-28 DIAGNOSIS — I110 Hypertensive heart disease with heart failure: Secondary | ICD-10-CM | POA: Insufficient documentation

## 2018-01-28 LAB — CBC
HCT: 39.1 % (ref 35.0–47.0)
Hemoglobin: 12.9 g/dL (ref 12.0–16.0)
MCH: 26.5 pg (ref 26.0–34.0)
MCHC: 33 g/dL (ref 32.0–36.0)
MCV: 80.3 fL (ref 80.0–100.0)
Platelets: 256 10*3/uL (ref 150–440)
RBC: 4.87 MIL/uL (ref 3.80–5.20)
RDW: 15.5 % — ABNORMAL HIGH (ref 11.5–14.5)
WBC: 8 10*3/uL (ref 3.6–11.0)

## 2018-01-28 LAB — TROPONIN I
Troponin I: <0.03 ng/mL (ref <0.03)
Troponin I: <0.03 ng/mL (ref <0.03)

## 2018-01-28 LAB — BASIC METABOLIC PANEL
Anion gap: 7 (ref 5–15)
BUN: 14 mg/dL (ref 8–23)
CO2: 27 mmol/L (ref 22–32)
Calcium: 9.4 mg/dL (ref 8.9–10.3)
Chloride: 104 mmol/L (ref 98–111)
Creatinine, Ser: 0.78 mg/dL (ref 0.44–1.00)
GFR calc Af Amer: >60 mL/min (ref >60)
GFR calc non Af Amer: >60 mL/min (ref >60)
Glucose, Bld: 82 mg/dL (ref 70–99)
Potassium: 3.8 mmol/L (ref 3.5–5.1)
Sodium: 138 mmol/L (ref 135–145)

## 2018-01-28 NOTE — ED Provider Notes (Signed)
[facility] Emergency Department Provider Note  Time seen: 4:38 PM  I have reviewed the triage vital signs and the nursing notes.   HISTORY  Chief Complaint Chest Pain    HPI Sarah [name] is a 67 y.o. female with a past medical history of CHF, COPD, diabetes, hypertension, presents to the emergency department for chest pain.  According to the patient she was at work on around 3 to 3:30 PM today she had 2 episodes of sharp chest pain which she describes as being located in the center of her chest lasting several seconds each and then dissipating.  Denies any trouble breathing nausea or diaphoresis at any point.  Denies any personal cardiac history although states in 1999 and 2000 she was experiencing chest pain and was prescribed nitroglycerin patches and had a cardiac catheterization although no stents placed.  Currently the patient appears well, states she has been under a lot of stress this week at her job and believes that the pain is stress-induced.  Denies any chest pain currently.   Past Medical History:  Diagnosis Date  . Carotid artery disease (HCC)   . Congestive heart failure (HCC)   . COPD (chronic obstructive pulmonary disease) (HCC)   . Diabetes mellitus without complication (HCC)    pt denies, however is on metformin daily  . HBP (high blood pressure)   . Heart murmur     Patient Active Problem List   Diagnosis Date Noted  . Chronic diastolic CHF (congestive heart failure), NYHA class 2 (HCC) 05/12/2016  . Controlled type 2 diabetes mellitus without complication, without long-term current use of insulin (HCC) 05/12/2016  . Benign essential HTN 04/04/2016  . Mixed hyperlipidemia 04/04/2016  . Other diseases of stomach and duodenum   . Problems with swallowing and mastication   . Left buttock abscess 12/15/2014  . DOE (dyspnea on exertion) 06/05/2014  . COPD type A (HCC) 06/05/2014  . Obesity 06/05/2014    Past Surgical History:   Procedure Laterality Date  . ABDOMINAL HYSTERECTOMY  1991  . CARDIAC CATHETERIZATION  1999  . COLONOSCOPY    . ESOPHAGEAL DILATION N/A 02/11/2016   Procedure: ESOPHAGEAL DILATION;  Surgeon: Midge Minium, MD;  Location: [facility];  Service: Endoscopy;  Laterality: N/A;  . ESOPHAGOGASTRODUODENOSCOPY (EGD) WITH PROPOFOL N/A 02/11/2016   Procedure: ESOPHAGOGASTRODUODENOSCOPY (EGD) WITH PROPOFOL;  Surgeon: Midge Minium, MD;  Location: [facility];  Service: Endoscopy;  Laterality: N/A;  DIABETIC    Prior to Admission medications   Medication Sig Start Date End Date Taking? Authorizing Provider  acetaminophen (TYLENOL) 500 MG tablet Take 500 mg by mouth as needed.    [provider]  aspirin 81 MG tablet Take 81 mg by mouth daily.    [provider]  carvedilol (COREG) 12.5 MG tablet Take 12.5 mg by mouth 2 (two) times daily with a meal. Reported on 10/08/2015    [provider]  dimenhyDRINATE (DRAMAMINE) 50 MG tablet Take 25 mg by mouth every 8 (eight) hours as needed.    [provider]  furosemide (LASIX) 20 MG tablet Take 20 mg by mouth daily.    [provider]  Glucosamine-Chondroitin (OSTEO BI-FLEX REGULAR STRENGTH PO) Take 1 tablet by mouth daily.    [provider]  isosorbide dinitrate (ISORDIL) 30 MG tablet Take 30 mg by mouth daily.    [provider]  losartan (COZAAR) 50 MG tablet Take 50 mg by mouth daily.    [provider]  metFORMIN (GLUCOPHAGE) 500 MG tablet  11/17/14   [provider]  Multiple Vitamins-Minerals (CENTRUM PO) Take by mouth daily.    [provider]  omeprazole (PRILOSEC) 20 MG capsule Take 20 mg by mouth daily.     [provider]  OVER THE COUNTER MEDICATION Tumeric 1 capsule daily    [provider]  PROAIR HFA 108 (90 Base) MCG/ACT inhaler INHALE TWO PUFFS BY MOUTH EVERY 2 TO 4 HOURS AS NEEDED 08/31/15   Mungal, Vishal, MD  simvastatin  (ZOCOR) 40 MG tablet Take 40 mg by mouth daily.    [provider]  SPIRIVA HANDIHALER 18 MCG inhalation capsule INHALE ONE DOSE BY MOUTH ONCE DAILY 02/02/15   Stephanie AcreMungal, Vishal, MD    No Known Allergies  Family History  Problem Relation Age of Onset  . Stroke Father   . Hypertension Father   . Hypertension Mother   . Stroke Mother   . Breast cancer Cousin [id number]  . Breast cancer Cousin [id number]    Social History Social History   Tobacco Use  . Smoking status: Former Smoker    Packs/day: 0.50    Years: 27.00    Pack years: 13.50    Types: Cigarettes    Last attempt to quit: 01/15/1996    Years since quitting: 22.0  . Smokeless tobacco: Never Used  Substance Use Topics  . Alcohol use: Yes    Alcohol/week: 0.0 standard drinks    Comment: WINE WEEKLY  . Drug use: No    Review of Systems Constitutional: Negative for fever. Eyes: Negative for visual complaints ENT: Negative for recent illness/congestion Cardiovascular: Sharp chest pain x2, moderate in severity today now resolved Respiratory: Negative for shortness of breath. Gastrointestinal: Negative for significant abdominal discomfort.  Negative for vomiting or diarrhea Genitourinary: Negative for urinary compaints Musculoskeletal: Negative for leg pain Skin: Negative for skin complaints  Neurological: Negative for headache All other ROS negative  ____________________________________________   PHYSICAL EXAM:  VITAL SIGNS: ED Triage Vitals  Enc Vitals Group     BP 01/28/18 1621 135/72     Pulse Rate 01/28/18 1621 77     Resp 01/28/18 1621 19     Temp 01/28/18 1621 98.3 F (36.8 C)     Temp Source 01/28/18 1621 Oral     SpO2 01/28/18 1621 98 %     Weight 01/28/18 1622 245 lb (111.1 kg)     Height 01/28/18 1622 5\' 3"  (1.6 m)     Head Circumference --      Peak Flow --      Pain Score --      Pain Loc --      Pain Edu? --      Excl. in GC? --     Constitutional: Alert and oriented. Well appearing and in no  distress. Eyes: Normal exam ENT   Head: Normocephalic and atraumatic.   Mouth/Throat: Mucous membranes are moist. Cardiovascular: Normal rate, regular rhythm. No murmur Respiratory: Normal respiratory effort without tachypnea nor retractions. Breath sounds are clear  Gastrointestinal: Soft and nontender. No distention.   Musculoskeletal: Nontender with normal range of motion in all extremities. No lower extremity tenderness or edema. Neurologic:  Normal speech and language. No gross focal neurologic deficits  Skin:  Skin is warm, dry and intact.  Psychiatric: Mood and affect are normal.   ____________________________________________    EKG  EKG reviewed and interpreted by myself shows normal sinus rhythm at 77 bpm with a  narrow QRS, normal axis, normal intervals, nonspecific ST changes.  ____________________________________________    RADIOLOGY  X-ray negative  ____________________________________________   INITIAL IMPRESSION / ASSESSMENT AND PLAN / ED COURSE  Pertinent labs & imaging results that were available during my care of the patient were reviewed by me and considered in my medical decision making (see chart for details).  Patient presents to the emergency department for chest pain starting around 3 to 3:30 PM today.  Describes the pain as moderate sharp pain lasting seconds, occurring twice back to back.  Denies any nausea diaphoresis or dyspnea.  Chest pain has since resolved denies any symptoms at this time.  Differential would include ACS, chest wall pain, esophageal spasm, muscular skeletal pain, pneumonia, pneumothorax.  We will check labs including cardiac enzymes, chest x-ray and obtain an EKG.  EKG is reassuringly well-appearing, x-ray and labs are pending, patient remains symptom-free  Patient's labs are negative.  Repeat troponin is negative.  Patient continues to appear well and is symptom-free.  We will discharge with cardiology follow-up.  Patient is  a patient of Dr. Gwen PoundsKowalski.  ____________________________________________   FINAL CLINICAL IMPRESSION(S) / ED DIAGNOSES  Chest pain    Minna AntisPaduchowski, Gradyn Shein, MD 01/28/18 2000

## 2018-01-28 NOTE — ED Notes (Signed)
Patient transported to X-ray 

## 2018-01-28 NOTE — ED Triage Notes (Addendum)
Pt stated CP started today at 3:30pm. Pt was at church and felt sharp intermittent cp in the middle of chest and a coworker called EMS.  Pt denies SOB,  dizziness, NVD. VSS. NAD noted upon arrival.

## 2018-01-28 NOTE — ED Notes (Signed)
Pt ambulatory to toilet with steady gait noted. Pt provided drink, ok per Dr. Lenard LancePaduchowski.

## 2018-01-28 NOTE — ED Triage Notes (Signed)
Pt brought in via [facility]. Per EMS, pt c/o sharp CP 6/10, center of chest. Pt reported weakness and dizziness. Per EMS pt denied SOB, NVD. Per EMS VS  bp 150/100; HR 80.

## 2018-01-28 NOTE — ED Notes (Signed)
EDMD at bedside

## 2018-01-28 NOTE — ED Notes (Signed)
MD at bedside. 

## 2018-01-28 NOTE — ED Notes (Signed)
Pt returned from X-ray.  

## 2018-01-29 ENCOUNTER — Ambulatory Visit
Admission: RE | Admit: 2018-01-29 | Discharge: 2018-01-29 | Disposition: A | Discharge status: Home / Self Care | Payer: PPO | Source: Ambulatory Visit | Attending: Internal Medicine | Admitting: Internal Medicine

## 2018-01-29 DIAGNOSIS — Z1231 Encounter for screening mammogram for malignant neoplasm of breast: Secondary | ICD-10-CM | POA: Diagnosis not present

## 2018-02-01 DIAGNOSIS — R079 Chest pain, unspecified: Secondary | ICD-10-CM | POA: Diagnosis not present

## 2018-02-01 DIAGNOSIS — I10 Essential (primary) hypertension: Secondary | ICD-10-CM | POA: Diagnosis not present

## 2018-02-11 ENCOUNTER — Encounter: Payer: Self-pay | Admitting: Emergency Medicine

## 2018-02-11 ENCOUNTER — Emergency Department
Admission: EM | Admit: 2018-02-11 | Discharge: 2018-02-11 | Disposition: A | Discharge status: Home / Self Care | Payer: PPO | Attending: Emergency Medicine | Admitting: Emergency Medicine

## 2018-02-11 ENCOUNTER — Other Ambulatory Visit: Payer: Self-pay

## 2018-02-11 ENCOUNTER — Emergency Department: Payer: PPO

## 2018-02-11 DIAGNOSIS — I110 Hypertensive heart disease with heart failure: Secondary | ICD-10-CM | POA: Insufficient documentation

## 2018-02-11 DIAGNOSIS — M25512 Pain in left shoulder: Secondary | ICD-10-CM | POA: Insufficient documentation

## 2018-02-11 DIAGNOSIS — E119 Type 2 diabetes mellitus without complications: Secondary | ICD-10-CM | POA: Diagnosis not present

## 2018-02-11 DIAGNOSIS — M549 Dorsalgia, unspecified: Secondary | ICD-10-CM | POA: Insufficient documentation

## 2018-02-11 DIAGNOSIS — I2510 Atherosclerotic heart disease of native coronary artery without angina pectoris: Secondary | ICD-10-CM | POA: Diagnosis not present

## 2018-02-11 DIAGNOSIS — M7918 Myalgia, other site: Secondary | ICD-10-CM | POA: Insufficient documentation

## 2018-02-11 DIAGNOSIS — Z87891 Personal history of nicotine dependence: Secondary | ICD-10-CM | POA: Insufficient documentation

## 2018-02-11 DIAGNOSIS — R51 Headache: Secondary | ICD-10-CM | POA: Diagnosis not present

## 2018-02-11 DIAGNOSIS — Z7984 Long term (current) use of oral hypoglycemic drugs: Secondary | ICD-10-CM | POA: Diagnosis not present

## 2018-02-11 DIAGNOSIS — M545 Low back pain: Secondary | ICD-10-CM | POA: Diagnosis not present

## 2018-02-11 DIAGNOSIS — M546 Pain in thoracic spine: Secondary | ICD-10-CM | POA: Diagnosis not present

## 2018-02-11 DIAGNOSIS — S8992XA Unspecified injury of left lower leg, initial encounter: Secondary | ICD-10-CM | POA: Diagnosis not present

## 2018-02-11 DIAGNOSIS — J449 Chronic obstructive pulmonary disease, unspecified: Secondary | ICD-10-CM | POA: Diagnosis not present

## 2018-02-11 DIAGNOSIS — S4992XA Unspecified injury of left shoulder and upper arm, initial encounter: Secondary | ICD-10-CM | POA: Diagnosis not present

## 2018-02-11 DIAGNOSIS — M25562 Pain in left knee: Secondary | ICD-10-CM | POA: Insufficient documentation

## 2018-02-11 DIAGNOSIS — I5032 Chronic diastolic (congestive) heart failure: Secondary | ICD-10-CM | POA: Diagnosis not present

## 2018-02-11 DIAGNOSIS — S299XXA Unspecified injury of thorax, initial encounter: Secondary | ICD-10-CM | POA: Diagnosis not present

## 2018-02-11 DIAGNOSIS — Z7982 Long term (current) use of aspirin: Secondary | ICD-10-CM | POA: Diagnosis not present

## 2018-02-11 DIAGNOSIS — Z79899 Other long term (current) drug therapy: Secondary | ICD-10-CM | POA: Diagnosis not present

## 2018-02-11 MED ORDER — IBUPROFEN 800 MG PO TABS
800.0000 mg | ORAL_TABLET | Freq: Once | ORAL | Status: AC
  Administered 2018-02-11: 800 mg via ORAL
  Filled 2018-02-11: qty 1

## 2018-02-11 MED ORDER — TRAMADOL HCL 50 MG PO TABS
50.0000 mg | ORAL_TABLET | Freq: Once | ORAL | Status: AC
Start: 2018-02-11 — End: 2018-02-11
  Administered 2018-02-11: 50 mg via ORAL
  Filled 2018-02-11: qty 1

## 2018-02-11 MED ORDER — TRAMADOL HCL 50 MG PO TABS: 50.0000 mg | ORAL_TABLET | Freq: Three times a day (TID) | ORAL | 0 refills | Status: AC | PRN

## 2018-02-11 MED ORDER — CYCLOBENZAPRINE HCL 10 MG PO TABS
10.0000 mg | ORAL_TABLET | Freq: Once | ORAL | Status: AC
  Administered 2018-02-11: 10 mg via ORAL
  Filled 2018-02-11: qty 1

## 2018-02-11 MED ORDER — IBUPROFEN 800 MG PO TABS: 800.0000 mg | ORAL_TABLET | Freq: Three times a day (TID) | ORAL | 0 refills | Status: DC | PRN

## 2018-02-11 MED ORDER — ONDANSETRON 4 MG PO TBDP
ORAL_TABLET | ORAL | Status: AC
  Administered 2018-02-11: 4 mg via ORAL
  Filled 2018-02-11: qty 1

## 2018-02-11 MED ORDER — ONDANSETRON 4 MG PO TBDP: 4.0000 mg | ORAL_TABLET | Freq: Three times a day (TID) | ORAL | 0 refills | Status: DC | PRN

## 2018-02-11 MED ORDER — ONDANSETRON 4 MG PO TBDP
4.0000 mg | ORAL_TABLET | Freq: Once | ORAL | Status: AC
  Administered 2018-02-11: 4 mg via ORAL

## 2018-02-11 MED ORDER — CYCLOBENZAPRINE HCL 5 MG PO TABS: 5.0000 mg | ORAL_TABLET | Freq: Three times a day (TID) | ORAL | 0 refills | Status: DC | PRN
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
## 2018-02-17 DIAGNOSIS — M25512 Pain in left shoulder: Secondary | ICD-10-CM | POA: Diagnosis not present

## 2018-02-17 DIAGNOSIS — M542 Cervicalgia: Secondary | ICD-10-CM | POA: Diagnosis not present

## 2018-02-22 DIAGNOSIS — R079 Chest pain, unspecified: Secondary | ICD-10-CM | POA: Diagnosis not present

## 2018-02-22 DIAGNOSIS — E119 Type 2 diabetes mellitus without complications: Secondary | ICD-10-CM | POA: Diagnosis not present

## 2018-02-22 DIAGNOSIS — I10 Essential (primary) hypertension: Secondary | ICD-10-CM | POA: Diagnosis not present

## 2018-02-22 DIAGNOSIS — I872 Venous insufficiency (chronic) (peripheral): Secondary | ICD-10-CM | POA: Diagnosis not present

## 2018-02-22 DIAGNOSIS — I5032 Chronic diastolic (congestive) heart failure: Secondary | ICD-10-CM | POA: Diagnosis not present

## 2018-02-22 DIAGNOSIS — E782 Mixed hyperlipidemia: Secondary | ICD-10-CM | POA: Diagnosis not present

## 2018-02-24 ENCOUNTER — Other Ambulatory Visit: Payer: Self-pay

## 2018-02-24 NOTE — Patient Outreach (Signed)
[facility]) Care Management  02/24/2018  Sarah [name] [birth year] [id number]  TELEPHONE SCREENING Referral date: 02/05/18 Referral source: Utilization management referral Referral reason: medication assistance Insurance: Health team advantage Attempt #1  Telephone call to patient regarding referral. Patient states she is unable to talk at this time due to being at work. Request call back at another time.   PLAN: RNCM will attempt 2nd telephone call to patient withn 4 business days.  RNCM will send outreach letter  George Ina RN,BSN,CCM [employer] Telephonic  [phone]

## 2018-02-26 ENCOUNTER — Other Ambulatory Visit: Payer: PPO

## 2018-02-26 NOTE — Patient Outreach (Signed)
[facility]) Care Management  02/26/2018  Johney FrameGladys O Shepard 1950/11/18 [id number]   TELEPHONE SCREENING Referral date: 02/05/18 Referral source: Utilization management referral Referral reason: medication assistance Insurance: Health team advantage Attempt #2  Telephone call to patient regarding referral. Unable to reach patient. HIPAA compliant voice message left with call back phone number.   PLAN: RNCM will attempt 2nd telephone call to patient withn 4 business days.    George InaDavina Deante Blough RN,BSN,CCM [facility] Telephonic  [phone]

## 2018-03-03 ENCOUNTER — Other Ambulatory Visit: Payer: Self-pay

## 2018-03-03 NOTE — Patient Outreach (Addendum)
[facility]) Care Management  03/03/2018  Sarah [name] 01-28-1951 [id number]   TELEPHONE SCREENING Referral date:02/05/18 Referral source:Utilization management referral Referral reason:medication assistance Insurance:Health team advantage Attempt #3  Telephone call to patient regardingreferral. Unable to reach patient. HIPAA compliant voice message left with call back phone number.  PLAN:RNCM will attempt 3rd telephone call to patient withn 4 business days.  Correction:  Due to patient having 3 call attempts if no return call will proceed with case closure.  George InaDavina Gavriella Hearst RN,BSN,CCM [facility] Telephonic  [phone]

## 2018-03-04 DIAGNOSIS — M6289 Other specified disorders of muscle: Secondary | ICD-10-CM | POA: Diagnosis not present

## 2018-03-04 DIAGNOSIS — S0990XA Unspecified injury of head, initial encounter: Secondary | ICD-10-CM | POA: Diagnosis not present

## 2018-03-04 DIAGNOSIS — R51 Headache: Secondary | ICD-10-CM | POA: Diagnosis not present

## 2018-03-10 ENCOUNTER — Other Ambulatory Visit: Payer: Self-pay

## 2018-03-10 NOTE — Patient Outreach (Signed)
[facility] Care Management  03/10/2018  Sarah FrameGladys O [name] 12-05-1950 [id number]   No response after 3 telephone calls and outreach letter attempt.  PLAN: RNCM will close patient due to being unable to reach.  RNCM will send closure notification to patient's primary MD   George InaDavina Shakita Keir RN,BSN,CCM [facility] Telephonic  [phone]

## 2018-03-15 DIAGNOSIS — K219 Gastro-esophageal reflux disease without esophagitis: Secondary | ICD-10-CM | POA: Diagnosis not present

## 2018-03-15 DIAGNOSIS — M542 Cervicalgia: Secondary | ICD-10-CM | POA: Diagnosis not present

## 2018-03-15 DIAGNOSIS — R0600 Dyspnea, unspecified: Secondary | ICD-10-CM | POA: Diagnosis not present

## 2018-03-15 DIAGNOSIS — E785 Hyperlipidemia, unspecified: Secondary | ICD-10-CM | POA: Diagnosis not present

## 2018-03-15 DIAGNOSIS — E119 Type 2 diabetes mellitus without complications: Secondary | ICD-10-CM | POA: Diagnosis not present

## 2018-03-15 DIAGNOSIS — R079 Chest pain, unspecified: Secondary | ICD-10-CM | POA: Diagnosis not present

## 2018-03-15 DIAGNOSIS — I10 Essential (primary) hypertension: Secondary | ICD-10-CM | POA: Diagnosis not present

## 2018-03-19 DIAGNOSIS — R079 Chest pain, unspecified: Secondary | ICD-10-CM | POA: Diagnosis not present

## 2018-03-19 DIAGNOSIS — R0600 Dyspnea, unspecified: Secondary | ICD-10-CM | POA: Diagnosis not present

## 2018-03-19 DIAGNOSIS — M542 Cervicalgia: Secondary | ICD-10-CM | POA: Diagnosis not present

## 2018-03-22 DIAGNOSIS — M542 Cervicalgia: Secondary | ICD-10-CM | POA: Diagnosis not present

## 2018-03-26 DIAGNOSIS — M542 Cervicalgia: Secondary | ICD-10-CM | POA: Diagnosis not present

## 2018-03-29 DIAGNOSIS — M542 Cervicalgia: Secondary | ICD-10-CM | POA: Diagnosis not present

## 2018-03-29 DIAGNOSIS — M25512 Pain in left shoulder: Secondary | ICD-10-CM | POA: Diagnosis not present
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
## 2018-04-09 ENCOUNTER — Ambulatory Visit (INDEPENDENT_AMBULATORY_CARE_PROVIDER_SITE_OTHER): Payer: PPO | Admitting: Internal Medicine

## 2018-04-09 ENCOUNTER — Encounter: Payer: Self-pay | Admitting: Internal Medicine

## 2018-04-09 VITALS — BP 118/70 | HR 78 | Resp 16 | Ht 63.0 in | Wt 255.0 lb

## 2018-04-09 DIAGNOSIS — J439 Emphysema, unspecified: Secondary | ICD-10-CM | POA: Diagnosis not present

## 2018-04-09 DIAGNOSIS — M542 Cervicalgia: Secondary | ICD-10-CM | POA: Diagnosis not present

## 2018-04-09 DIAGNOSIS — R0609 Other forms of dyspnea: Secondary | ICD-10-CM | POA: Diagnosis not present

## 2018-04-09 DIAGNOSIS — Z23 Encounter for immunization: Secondary | ICD-10-CM

## 2018-04-09 DIAGNOSIS — R0600 Dyspnea, unspecified: Secondary | ICD-10-CM

## 2018-04-09 MED ORDER — GLYCOPYRROLATE-FORMOTEROL 9-4.8 MCG/ACT IN AERO: 2.0000 | INHALATION_SPRAY | Freq: Every day | RESPIRATORY_TRACT | 5 refills | Status: DC

## 2018-04-09 NOTE — Patient Instructions (Addendum)
--  Will try bevespi 2 puffs once daily after spiriva runs out. Once you are out of the donut hole at the beginning of next year you can go back to using spiriva.  Do not take bevespi and spiriva on the same day.   Will give flu vaccine today.

## 2018-04-12 DIAGNOSIS — M542 Cervicalgia: Secondary | ICD-10-CM | POA: Diagnosis not present

## 2018-04-16 DIAGNOSIS — M542 Cervicalgia: Secondary | ICD-10-CM | POA: Diagnosis not present

## 2018-04-19 DIAGNOSIS — M542 Cervicalgia: Secondary | ICD-10-CM | POA: Diagnosis not present

## 2018-04-23 DIAGNOSIS — M542 Cervicalgia: Secondary | ICD-10-CM | POA: Diagnosis not present

## 2018-04-26 DIAGNOSIS — M542 Cervicalgia: Secondary | ICD-10-CM | POA: Diagnosis not present

## 2018-04-30 DIAGNOSIS — M542 Cervicalgia: Secondary | ICD-10-CM | POA: Diagnosis not present

## 2018-05-03 DIAGNOSIS — M542 Cervicalgia: Secondary | ICD-10-CM | POA: Diagnosis not present

## 2018-05-11 DIAGNOSIS — M542 Cervicalgia: Secondary | ICD-10-CM | POA: Diagnosis not present

## 2018-05-14 DIAGNOSIS — M542 Cervicalgia: Secondary | ICD-10-CM | POA: Diagnosis not present

## 2018-05-18 DIAGNOSIS — M542 Cervicalgia: Secondary | ICD-10-CM | POA: Diagnosis not present

## 2018-05-24 DIAGNOSIS — M542 Cervicalgia: Secondary | ICD-10-CM | POA: Diagnosis not present

## 2018-05-24 DIAGNOSIS — M25512 Pain in left shoulder: Secondary | ICD-10-CM | POA: Diagnosis not present

## 2018-05-31 DIAGNOSIS — M542 Cervicalgia: Secondary | ICD-10-CM | POA: Diagnosis not present

## 2018-06-04 DIAGNOSIS — M542 Cervicalgia: Secondary | ICD-10-CM | POA: Diagnosis not present

## 2018-06-18 DIAGNOSIS — M79603 Pain in arm, unspecified: Secondary | ICD-10-CM | POA: Diagnosis not present

## 2018-06-18 DIAGNOSIS — M542 Cervicalgia: Secondary | ICD-10-CM | POA: Diagnosis not present

## 2018-06-28 DIAGNOSIS — J40 Bronchitis, not specified as acute or chronic: Secondary | ICD-10-CM | POA: Diagnosis not present

## 2018-07-02 ENCOUNTER — Encounter: Payer: Self-pay | Admitting: Emergency Medicine

## 2018-07-02 ENCOUNTER — Emergency Department: Payer: PPO

## 2018-07-02 ENCOUNTER — Other Ambulatory Visit: Payer: Self-pay

## 2018-07-02 ENCOUNTER — Emergency Department
Admission: EM | Admit: 2018-07-02 | Discharge: 2018-07-02 | Disposition: A | Discharge status: Home / Self Care | Payer: PPO | Attending: Emergency Medicine | Admitting: Emergency Medicine

## 2018-07-02 DIAGNOSIS — J441 Chronic obstructive pulmonary disease with (acute) exacerbation: Secondary | ICD-10-CM

## 2018-07-02 DIAGNOSIS — Z87891 Personal history of nicotine dependence: Secondary | ICD-10-CM | POA: Diagnosis not present

## 2018-07-02 DIAGNOSIS — Z7982 Long term (current) use of aspirin: Secondary | ICD-10-CM | POA: Diagnosis not present

## 2018-07-02 DIAGNOSIS — J111 Influenza due to unidentified influenza virus with other respiratory manifestations: Secondary | ICD-10-CM | POA: Diagnosis not present

## 2018-07-02 DIAGNOSIS — Z79899 Other long term (current) drug therapy: Secondary | ICD-10-CM | POA: Diagnosis not present

## 2018-07-02 DIAGNOSIS — R0602 Shortness of breath: Secondary | ICD-10-CM | POA: Diagnosis not present

## 2018-07-02 DIAGNOSIS — I110 Hypertensive heart disease with heart failure: Secondary | ICD-10-CM | POA: Insufficient documentation

## 2018-07-02 DIAGNOSIS — I2510 Atherosclerotic heart disease of native coronary artery without angina pectoris: Secondary | ICD-10-CM | POA: Insufficient documentation

## 2018-07-02 DIAGNOSIS — Z7984 Long term (current) use of oral hypoglycemic drugs: Secondary | ICD-10-CM | POA: Insufficient documentation

## 2018-07-02 DIAGNOSIS — I5032 Chronic diastolic (congestive) heart failure: Secondary | ICD-10-CM | POA: Insufficient documentation

## 2018-07-02 LAB — CBC WITH DIFFERENTIAL/PLATELET
Abs Immature Granulocytes: 0.03 10*3/uL (ref 0.00–0.07)
Basophils Absolute: 0 10*3/uL (ref 0.0–0.1)
Basophils Relative: 0 %
Eosinophils Absolute: 0.3 10*3/uL (ref 0.0–0.5)
Eosinophils Relative: 4 %
HCT: 37 % (ref 36.0–46.0)
Hemoglobin: 11.7 g/dL — ABNORMAL LOW (ref 12.0–15.0)
Immature Granulocytes: 0 %
Lymphocytes Relative: 42 %
Lymphs Abs: 3.3 10*3/uL (ref 0.7–4.0)
MCH: 25.5 pg — ABNORMAL LOW (ref 26.0–34.0)
MCHC: 31.6 g/dL (ref 30.0–36.0)
MCV: 80.8 fL (ref 80.0–100.0)
Monocytes Absolute: 0.6 10*3/uL (ref 0.1–1.0)
Monocytes Relative: 8 %
Neutro Abs: 3.6 10*3/uL (ref 1.7–7.7)
Neutrophils Relative %: 46 %
Platelets: 278 10*3/uL (ref 150–400)
RBC: 4.58 MIL/uL (ref 3.87–5.11)
RDW: 14.3 % (ref 11.5–15.5)
Smear Review: NORMAL
WBC: 7.8 10*3/uL (ref 4.0–10.5)
nRBC: 0 % (ref 0.0–0.2)

## 2018-07-02 LAB — BASIC METABOLIC PANEL
Anion gap: 7 (ref 5–15)
BUN: 9 mg/dL (ref 8–23)
CO2: 24 mmol/L (ref 22–32)
Calcium: 9.1 mg/dL (ref 8.9–10.3)
Chloride: 109 mmol/L (ref 98–111)
Creatinine, Ser: 0.73 mg/dL (ref 0.44–1.00)
GFR calc Af Amer: >60 mL/min (ref >60)
GFR calc non Af Amer: >60 mL/min (ref >60)
Glucose, Bld: 99 mg/dL (ref 70–99)
Potassium: 3.6 mmol/L (ref 3.5–5.1)
Sodium: 140 mmol/L (ref 135–145)

## 2018-07-02 LAB — BRAIN NATRIURETIC PEPTIDE: B Natriuretic Peptide: 62 pg/mL (ref 0.0–100.0)

## 2018-07-02 LAB — TROPONIN I: Troponin I: <0.03 ng/mL (ref <0.03)

## 2018-07-02 MED ORDER — IPRATROPIUM-ALBUTEROL 0.5-2.5 (3) MG/3ML IN SOLN
3.0000 mL | Freq: Once | RESPIRATORY_TRACT | Status: AC
  Administered 2018-07-02: 3 mL via RESPIRATORY_TRACT
  Filled 2018-07-02: qty 6

## 2018-07-02 MED ORDER — PREDNISONE 20 MG PO TABS: 60.0000 mg | ORAL_TABLET | Freq: Every day | ORAL | 0 refills | Status: AC

## 2018-07-02 MED ORDER — METHYLPREDNISOLONE SODIUM SUCC 125 MG IJ SOLR
125.0000 mg | Freq: Once | INTRAMUSCULAR | Status: AC
  Administered 2018-07-02: 125 mg via INTRAVENOUS
  Filled 2018-07-02: qty 2

## 2018-07-02 MED ORDER — IPRATROPIUM-ALBUTEROL 0.5-2.5 (3) MG/3ML IN SOLN
3.0000 mL | Freq: Once | RESPIRATORY_TRACT | Status: AC
  Administered 2018-07-02: 3 mL via RESPIRATORY_TRACT

## 2018-07-02 NOTE — ED Triage Notes (Addendum)
Pt arrived via POV with reports of being dx with the flu on Monday, pt states she continues to have cough and congestion, pt is not currently taking Tamiflu.  Pt states she has trouble breathing at night when laying down.  Pt currently taking zpak, tessalon perles and zyrtec as well as theraflu.   Pt also reports frequently sweating as well.

## 2018-07-02 NOTE — ED Notes (Signed)
Patient transported to X-ray 

## 2018-07-02 NOTE — Discharge Instructions (Addendum)

## 2018-07-02 NOTE — ED Provider Notes (Signed)
[facility] Emergency Department Provider Note  ____________________________________________  Time seen: Approximately 11:54 AM  I have reviewed the triage vital signs and the nursing notes.   HISTORY  Chief Complaint Influenza and Shortness of Breath   HPI Sarah [name] is a 68 y.o. female with a history of CHF with preserved EF, COPD, diabetes, hypertension who presents for evaluation of shortness of breath.  Patient reports cough, congestion, chills and shortness of breath for 7 days.  4 days ago she went to see her primary care doctor and was diagnosed with flu.  He put her on azithromycin, cetirizine, and cough medicine.  She continues to complain of shortness of breath.  She has been sleeping propped up in the recliner because she wakes up in the middle of the night very short of breath.  The shortness of breath is only present when she is coughing or with exertion and resolves at rest.  She continues to have chills but no fever.  The cough is dry.  No chest pain, no vomiting or diarrhea.  She has been using her inhalers at home with relief.  She has not noted any weight gain or leg pain or swelling, no personal family history of blood clots, no recent travel immobilization, no hemoptysis, no exogenous hormones.  Past Medical History:  Diagnosis Date  . Carotid artery disease (HCC)   . Congestive heart failure (HCC)   . COPD (chronic obstructive pulmonary disease) (HCC)   . Diabetes mellitus without complication (HCC)    pt denies, however is on metformin daily  . HBP (high blood pressure)   . Heart murmur     Patient Active Problem List   Diagnosis Date Noted  . Chronic diastolic CHF (congestive heart failure), NYHA class 2 (HCC) 05/12/2016  . Controlled type 2 diabetes mellitus without complication, without long-term current use of insulin (HCC) 05/12/2016  . Benign essential HTN 04/04/2016  . Mixed hyperlipidemia 04/04/2016  . Other diseases of  stomach and duodenum   . Problems with swallowing and mastication   . Left buttock abscess 12/15/2014  . DOE (dyspnea on exertion) 06/05/2014  . COPD type A (HCC) 06/05/2014  . Obesity 06/05/2014    Past Surgical History:  Procedure Laterality Date  . ABDOMINAL HYSTERECTOMY  1991  . CARDIAC CATHETERIZATION  1999  . COLONOSCOPY    . ESOPHAGEAL DILATION N/A 02/11/2016   Procedure: ESOPHAGEAL DILATION;  Surgeon: Midge Miniumarren Wohl, MD;  [facility];  Service: Endoscopy;  Laterality: N/A;  . ESOPHAGOGASTRODUODENOSCOPY (EGD) WITH PROPOFOL N/A 02/11/2016   Procedure: ESOPHAGOGASTRODUODENOSCOPY (EGD) WITH PROPOFOL;  Surgeon: Midge Miniumarren Wohl, MD;  [facility];  Service: Endoscopy;  Laterality: N/A;  DIABETIC    Prior to Admission medications   Medication Sig Start Date End Date Taking? Authorizing Provider  acetaminophen (TYLENOL) 500 MG tablet Take 500 mg by mouth as needed.    [provider]  aspirin 81 MG tablet Take 81 mg by mouth daily.    [provider]  carvedilol (COREG) 12.5 MG tablet Take 12.5 mg by mouth 2 (two) times daily with a meal. Reported on 10/08/2015    [provider]  cyclobenzaprine (FLEXERIL) 5 MG tablet Take 1 tablet (5 mg total) by mouth 3 (three) times daily as needed for muscle spasms. 02/11/18   Menshew, Charlesetta IvoryJenise V Bacon, PA-C  dimenhyDRINATE (DRAMAMINE) 50 MG tablet Take 25 mg by mouth every 8 (eight) hours as needed.    [provider]  furosemide (LASIX) 20 MG tablet Take 20 mg by mouth daily.    [provider]  Glucosamine-Chondroitin (OSTEO BI-FLEX REGULAR STRENGTH PO) Take 1 tablet by mouth daily.    [provider]  Glycopyrrolate-Formoterol (BEVESPI AEROSPHERE) 9-4.8 MCG/ACT AERO Inhale 2 puffs into the lungs daily. 04/09/18   Shane Crutch, MD  ibuprofen (ADVIL,MOTRIN) 800 MG tablet Take 1 tablet (800 mg total) by mouth every 8 (eight) hours as needed. 02/11/18   Menshew,  Charlesetta Ivory, PA-C  isosorbide mononitrate (IMDUR) 30 MG 24 hr tablet Take 30 mg by mouth daily. 01/13/18   [provider]  losartan (COZAAR) 50 MG tablet Take 50 mg by mouth daily.    [provider]  metFORMIN (GLUCOPHAGE) 500 MG tablet  11/17/14   [provider]  Multiple Vitamins-Minerals (CENTRUM PO) Take 1 tablet by mouth daily.     [provider]  omeprazole (PRILOSEC) 20 MG capsule Take 20 mg by mouth daily.     [provider]  predniSONE (DELTASONE) 20 MG tablet Take 3 tablets (60 mg total) by mouth daily for 4 days. 07/02/18 07/06/18  Nita Sickle, MD  PROAIR HFA 108 [phone] Base) MCG/ACT inhaler INHALE TWO PUFFS BY MOUTH EVERY 2 TO 4 HOURS AS NEEDED 08/31/15   Mungal, Vishal, MD  simvastatin (ZOCOR) 40 MG tablet Take 40 mg by mouth daily at 6 PM.     [provider]  SPIRIVA HANDIHALER 18 MCG inhalation capsule INHALE ONE DOSE BY MOUTH ONCE DAILY 02/02/15   Stephanie Acre, MD    Allergies Patient has no known allergies.  Family History  Problem Relation Age of Onset  . Stroke Father   . Hypertension Father   . Hypertension Mother   . Stroke Mother   . Breast cancer Cousin 73  . Breast cancer Cousin 60    Social History Social History   Tobacco Use  . Smoking status: Former Smoker    Packs/day: 0.50    Years: 27.00    Pack years: 13.50    Types: Cigarettes    Last attempt to quit: 01/15/1996    Years since quitting: 22.4  . Smokeless tobacco: Never Used  Substance Use Topics  . Alcohol use: Yes    Alcohol/week: 0.0 standard drinks    Comment: WINE WEEKLY  . Drug use: No    Review of Systems  Constitutional: Negative for fever. + chills Eyes: Negative for visual changes. ENT: Negative for sore throat. + congestion Neck: No neck pain  Cardiovascular: Negative for chest pain. Respiratory: + shortness of breath, cough Gastrointestinal: Negative for abdominal pain, vomiting or diarrhea. Genitourinary:  Negative for dysuria. Musculoskeletal: Negative for back pain. Skin: Negative for rash. Neurological: Negative for headaches, weakness or numbness. Psych: No SI or HI  ____________________________________________   PHYSICAL EXAM:  VITAL SIGNS: ED Triage Vitals [07/02/18 1118]  Enc Vitals Group     BP 119/87     Pulse Rate 77     Resp 18     Temp 98.3 F (36.8 C)     Temp Source Oral     SpO2 93 %     Weight 250 lb (113.4 kg)     Height 5\' 3"  (1.6 m)     Head Circumference      Peak Flow      Pain Score      Pain Loc      Pain Edu?      Excl. in GC?  Constitutional: Alert and oriented. Well appearing and in no apparent distress. HEENT:      Head: Normocephalic and atraumatic.         Eyes: Conjunctivae are normal. Sclera is non-icteric.       Mouth/Throat: Mucous membranes are moist.       Neck: Supple with no signs of meningismus. Cardiovascular: Regular rate and rhythm. No murmurs, gallops, or rubs. 2+ symmetrical distal pulses are present in all extremities. No JVD. Respiratory: Normal respiratory effort, normal sats, good air movement bilaterally with faint expiratory wheezes. Gastrointestinal: Soft, non tender, and non distended with positive bowel sounds. No rebound or guarding. Musculoskeletal: Nontender with normal range of motion in all extremities. No edema, cyanosis, or erythema of extremities. Neurologic: Normal speech and language. Face is symmetric. Moving all extremities. No gross focal neurologic deficits are appreciated. Skin: Skin is warm, dry and intact. No rash noted. Psychiatric: Mood and affect are normal. Speech and behavior are normal.  ____________________________________________   LABS (all labs ordered are listed, but only abnormal results are displayed)  Labs Reviewed  CBC WITH DIFFERENTIAL/PLATELET - Abnormal; Notable for the following components:      Result Value   Hemoglobin 11.7 (*)    MCH 25.5 (*)    All other components  within normal limits  BASIC METABOLIC PANEL  TROPONIN I  BRAIN NATRIURETIC PEPTIDE   ____________________________________________  EKG  ED ECG REPORT I, Nita Sickle, the attending physician, personally viewed and interpreted this ECG.  Normal sinus rhythm, rate of 74, normal intervals, normal axis, low voltage QRS in precordial leads, no ST elevations or depressions.  Unchanged from prior ____________________________________________  RADIOLOGY  I have personally reviewed the images performed during this visit and I agree with the Radiologist's read.   Interpretation by Radiologist:  Dg Chest 2 View  Result Date: 07/02/2018 CLINICAL DATA:  Shortness of breath EXAM: CHEST - 2 VIEW COMPARISON:  01/28/2018 FINDINGS: Mild cardiomegaly. No confluent airspace opacities or effusions. No acute bony abnormality. IMPRESSION: Cardiomegaly.  No active disease. Electronically Signed   By: Charlett Nose M.D.   On: 07/02/2018 11:42     ____________________________________________   PROCEDURES  Procedure(s) performed: None Procedures Critical Care performed:  None ____________________________________________   INITIAL IMPRESSION / ASSESSMENT AND PLAN / ED COURSE   68 y.o. female with a history of CHF with preserved EF, COPD, diabetes, hypertension who presents for evaluation of shortness of breath in the setting of recent positive flu swab 4 days ago.  Ddx COPD, PNA, Flu, CHF.  Patient looks euvolemic on exam, normal work of breathing, normal sats, faint expiratory wheezes with good air movement on auscultation.  Vitals are within normal limits with no evidence of sepsis or fever.  Chest x-ray negative for pneumonia.  Presentation most likely concerning for COPD exacerbation in the setting of influenza.  Will start patient on steroids, will give duo nebs.  Will check basic labs.  EKG with no evidence of ischemia or dysrhythmias.  Clinical Course as of Jul 02 1332  Fri Jul 02, 2018    1333 Patient reports feeling better after inhalers and steroids.  Will discharge home on a prednisone course.  Recommended follow-up with primary care doctor.  Discussed and return precautions.  Labs with no acute findings.   [CV]    Clinical Course User Index [CV] Don Perking [place], MD     As part of my medical decision making, I reviewed the following data within the electronic MEDICAL RECORD NUMBER Nursing  notes reviewed and incorporated, Labs reviewed , EKG interpreted , Old EKG reviewed, Old chart reviewed, Radiograph reviewed , Notes from prior ED visits and [place] Controlled Substance Database    Pertinent labs & imaging results that were available during my care of the patient were reviewed by me and considered in my medical decision making (see chart for details).    ____________________________________________   FINAL CLINICAL IMPRESSION(S) / ED DIAGNOSES  Final diagnoses:  COPD exacerbation (HCC)  Influenza      NEW MEDICATIONS STARTED DURING THIS VISIT:  ED Discharge Orders         Ordered    predniSONE (DELTASONE) 20 MG tablet  Daily     07/02/18 1334           Note:  This document was prepared using Dragon voice recognition software and may include unintentional dictation errors.    Don Perking, [place], MD 07/02/18 1335

## 2018-07-02 NOTE — ED Notes (Signed)
Pt verbalized understanding of discharge instructions. NAD at this time. 

## 2018-07-12 DIAGNOSIS — R5383 Other fatigue: Secondary | ICD-10-CM | POA: Diagnosis not present

## 2018-07-12 DIAGNOSIS — K219 Gastro-esophageal reflux disease without esophagitis: Secondary | ICD-10-CM | POA: Diagnosis not present

## 2018-07-12 DIAGNOSIS — E119 Type 2 diabetes mellitus without complications: Secondary | ICD-10-CM | POA: Diagnosis not present

## 2018-07-12 DIAGNOSIS — J449 Chronic obstructive pulmonary disease, unspecified: Secondary | ICD-10-CM | POA: Diagnosis not present

## 2018-07-12 DIAGNOSIS — K922 Gastrointestinal hemorrhage, unspecified: Secondary | ICD-10-CM | POA: Diagnosis not present

## 2018-07-12 DIAGNOSIS — E785 Hyperlipidemia, unspecified: Secondary | ICD-10-CM | POA: Diagnosis not present

## 2018-07-12 DIAGNOSIS — I10 Essential (primary) hypertension: Secondary | ICD-10-CM | POA: Diagnosis not present

## 2018-07-19 ENCOUNTER — Encounter: Payer: Self-pay | Admitting: Gastroenterology

## 2018-07-19 ENCOUNTER — Telehealth: Payer: Self-pay | Admitting: Gastroenterology

## 2018-07-19 ENCOUNTER — Ambulatory Visit: Payer: PPO | Admitting: Gastroenterology

## 2018-07-19 ENCOUNTER — Other Ambulatory Visit: Payer: Self-pay

## 2018-07-19 VITALS — BP 121/76 | HR 75 | Ht 63.0 in | Wt 254.0 lb

## 2018-07-19 DIAGNOSIS — K921 Melena: Secondary | ICD-10-CM

## 2018-07-19 MED ORDER — HYDROCORTISONE ACETATE 25 MG RE SUPP: 25.0000 mg | Freq: Two times a day (BID) | RECTAL | 1 refills | Status: DC

## 2018-07-19 NOTE — Telephone Encounter (Signed)
Pt left vm she states she was in the office today and was told she would get a fieber diet on her summary but it was not attached please call pt or email the fieber diet to her

## 2018-07-19 NOTE — Patient Instructions (Signed)

## 2018-07-19 NOTE — Telephone Encounter (Signed)
Emailed pt a copy of her high fiber diet recommendations.

## 2018-07-19 NOTE — Progress Notes (Signed)
Gastroenterology Consultation  Referring Provider:     Sherrie Mustache, MD Primary Care Physician:  Sherrie Mustache, MD Primary Gastroenterologist:  Dr. Servando Snare     Reason for Consultation:     Hematochezia        HPI:   Sarah [name] is a 68 y.o. y/o female referred for consultation & management of hematochezia by Dr. Sherrie Mustache, MD.  This patient comes in today after seeing me back in 2017 for dysphagia.  That time the patient underwent an EGD.  The patient's EGD showed normal mid esophageal biopsies without any strictures or narrowing seen. The patient had empirical dilation of the esophagus with a 54 [place] dilator. The patient is now being sent here for evaluation for blood in her stool. The patient was in the emergency room in the middle of January for shortness of breath and influenza.  The patient reports that she had a colonoscopy by me in 2016 that was normal.  The patient also reports that her rectal bleeding is bright red in color and only on the toilet paper.  She denies any blood mixed with her bowel movements.  There is no report of any unexplained weight loss fevers chills nausea or vomiting.  She states that she is feeling much better now since getting over the flu.  She also states that the rectal bleeding is more prevalent in the last 2 weeks.  There is no change in bowel habits to explain her rectal bleeding.  Past Medical History:  Diagnosis Date  . Carotid artery disease (HCC)   . Congestive heart failure (HCC)   . COPD (chronic obstructive pulmonary disease) (HCC)   . Diabetes mellitus without complication (HCC)    pt denies, however is on metformin daily  . HBP (high blood pressure)   . Heart murmur     Past Surgical History:  Procedure Laterality Date  . ABDOMINAL HYSTERECTOMY  1991  . CARDIAC CATHETERIZATION  1999  . COLONOSCOPY    . ESOPHAGEAL DILATION N/A 02/11/2016   Procedure: ESOPHAGEAL DILATION;  Surgeon: Midge Minium, MD;  [facility];  Service: Endoscopy;  Laterality: N/A;  . ESOPHAGOGASTRODUODENOSCOPY (EGD) WITH PROPOFOL N/A 02/11/2016   Procedure: ESOPHAGOGASTRODUODENOSCOPY (EGD) WITH PROPOFOL;  Surgeon: Midge Minium, MD;  [facility];  Service: Endoscopy;  Laterality: N/A;  DIABETIC    Prior to Admission medications   Medication Sig Start Date End Date Taking? Authorizing Provider  acetaminophen (TYLENOL) 500 MG tablet Take 500 mg by mouth as needed.    [provider]  aspirin 81 MG tablet Take 81 mg by mouth daily.    [provider]  carvedilol (COREG) 12.5 MG tablet Take 12.5 mg by mouth 2 (two) times daily with a meal. Reported on 10/08/2015    [provider]  cyclobenzaprine (FLEXERIL) 5 MG tablet Take 1 tablet (5 mg total) by mouth 3 (three) times daily as needed for muscle spasms. 02/11/18   Menshew, Charlesetta Ivory, PA-C  dimenhyDRINATE (DRAMAMINE) 50 MG tablet Take 25 mg by mouth every 8 (eight) hours as needed.    [provider]  furosemide (LASIX) 20 MG tablet Take 20 mg by mouth daily.    [provider]  Glucosamine-Chondroitin (OSTEO BI-FLEX REGULAR STRENGTH PO) Take 1 tablet by mouth daily.    [provider]  Glycopyrrolate-Formoterol (BEVESPI AEROSPHERE) 9-4.8 MCG/ACT AERO Inhale 2 puffs into the lungs daily. 04/09/18   Shane Crutch, MD  ibuprofen (ADVIL,MOTRIN) 800 MG  tablet Take 1 tablet (800 mg total) by mouth every 8 (eight) hours as needed. 02/11/18   Menshew, Charlesetta IvoryJenise V Bacon, PA-C  isosorbide mononitrate (IMDUR) 30 MG 24 hr tablet Take 30 mg by mouth daily. 01/13/18   [provider]  losartan (COZAAR) 50 MG tablet Take 50 mg by mouth daily.    [provider]  metFORMIN (GLUCOPHAGE) 500 MG tablet  11/17/14   [provider]  Multiple Vitamins-Minerals (CENTRUM PO) Take 1 tablet by mouth daily.     [provider]  omeprazole (PRILOSEC) 20 MG capsule Take 20 mg by mouth daily.      [provider]  PROAIR HFA 108 (90 Base) MCG/ACT inhaler INHALE TWO PUFFS BY MOUTH EVERY 2 TO 4 HOURS AS NEEDED 08/31/15   Mungal, Vishal, MD  simvastatin (ZOCOR) 40 MG tablet Take 40 mg by mouth daily at 6 PM.     [provider]  SPIRIVA HANDIHALER 18 MCG inhalation capsule INHALE ONE DOSE BY MOUTH ONCE DAILY 02/02/15   Stephanie AcreMungal, Vishal, MD    Family History  Problem Relation Age of Onset  . Stroke Father   . Hypertension Father   . Hypertension Mother   . Stroke Mother   . Breast cancer Cousin 2258  . Breast cancer Cousin [phone]     Social History   Tobacco Use  . Smoking status: Former Smoker    Packs/day: 0.50    Years: 27.00    Pack years: 13.50    Types: Cigarettes    Last attempt to quit: 01/15/1996    Years since quitting: 22.5  . Smokeless tobacco: Never Used  Substance Use Topics  . Alcohol use: Yes    Alcohol/week: 0.0 standard drinks    Comment: WINE WEEKLY  . Drug use: No    Allergies as of 07/19/2018  . (No Known Allergies)    Review of Systems:    All systems reviewed and negative except where noted in HPI.   Physical Exam:  There were no vitals taken for this visit. No LMP recorded. Patient has had a hysterectomy. General:   Alert,  Well-developed, well-nourished, pleasant and cooperative in NAD Head:  Normocephalic and atraumatic. Eyes:  Sclera clear, no icterus.   Conjunctiva pink. Ears:  Normal auditory acuity. Nose:  No deformity, discharge, or lesions. Mouth:  No deformity or lesions,oropharynx pink & moist. Neck:  Supple; no masses or thyromegaly. Lungs:  Respirations even and unlabored.  Clear throughout to auscultation.   No wheezes, crackles, or rhonchi. No acute distress. Heart:  Regular rate and rhythm; no murmurs, clicks, rubs, or gallops. Abdomen:  Normal bowel sounds.  No bruits.  Soft, non-tender and non-distended without masses, hepatosplenomegaly or hernias noted.  No guarding or rebound tenderness.  Negative Carnett sign.    Rectal:  Deferred.  Msk:  Symmetrical without gross deformities.  Good, equal movement & strength bilaterally. Pulses:  Normal pulses noted. Extremities:  No clubbing or edema.  No cyanosis. Neurologic:  Alert and oriented x3;  grossly normal neurologically. Skin:  Intact without significant lesions or rashes.  No jaundice. Lymph Nodes:  No significant cervical adenopathy. Psych:  Alert and cooperative. Normal mood and affect.  Imaging Studies: Dg Chest 2 View  Result Date: 07/02/2018 CLINICAL DATA:  Shortness of breath EXAM: CHEST - 2 VIEW COMPARISON:  01/28/2018 FINDINGS: Mild cardiomegaly. No confluent airspace opacities or effusions. No acute bony abnormality. IMPRESSION: Cardiomegaly.  No active disease. Electronically Signed   By: Caryn BeeKevin  Dover M.D.   On: 07/02/2018 11:42    Assessment and Plan:   Sarah [name] is a 68 y.o. y/o female who comes in with hematochezia and a colonoscopy approximately 4 years ago.  The patient will be given Anusol suppositories and has been told to increase fiber in her diet.  The patient has also been told that if the rectal bleeding does not resolve then she should call our office and she will be set up for a repeat colonoscopy.  The patient has been told that since she is devoid of any worry symptoms such as unexplained weight loss, change in bowel habits, family history of colon cancer or colon polyps or admixed with the stool then we should try conservative management first.  The patient has been explained the plan and agrees with it.   Midge Minium, MD. Clementeen Graham    Note: This dictation was prepared with Dragon dictation along with smaller phrase technology. Any transcriptional errors that result from this process are unintentional.

## 2018-07-19 NOTE — Telephone Encounter (Signed)
Patient called stating she had seen Dr Servando Snare & he called her in a prescription for hydrocortisone (ANUSOL-HC) 25 MG suppository to the [facility] and it was to expensive.Please call her in something else. She also states she did not get her printout for her high fiber diet. I have printed the AVS with this information on it and mailed it to the patient.

## 2018-07-21 ENCOUNTER — Other Ambulatory Visit: Payer: Self-pay

## 2018-07-21 MED ORDER — HYDROCORTISONE ACE-PRAMOXINE 1-1 % RE CREA: 1.0000 "application " | TOPICAL_CREAM | Freq: Two times a day (BID) | RECTAL | 0 refills | Status: DC

## 2018-07-22 NOTE — Telephone Encounter (Signed)
[facility] due to Anusol not being covered.

## 2018-08-19 DIAGNOSIS — M7712 Lateral epicondylitis, left elbow: Secondary | ICD-10-CM | POA: Diagnosis not present

## 2018-08-19 DIAGNOSIS — S56912A Strain of unspecified muscles, fascia and tendons at forearm level, left arm, initial encounter: Secondary | ICD-10-CM | POA: Diagnosis not present

## 2018-10-11 DIAGNOSIS — I10 Essential (primary) hypertension: Secondary | ICD-10-CM | POA: Diagnosis not present

## 2018-10-11 DIAGNOSIS — I509 Heart failure, unspecified: Secondary | ICD-10-CM | POA: Diagnosis not present

## 2018-10-11 DIAGNOSIS — R739 Hyperglycemia, unspecified: Secondary | ICD-10-CM | POA: Diagnosis not present

## 2018-10-11 DIAGNOSIS — E785 Hyperlipidemia, unspecified: Secondary | ICD-10-CM | POA: Diagnosis not present

## 2018-10-11 DIAGNOSIS — J449 Chronic obstructive pulmonary disease, unspecified: Secondary | ICD-10-CM | POA: Diagnosis not present

## 2018-10-11 DIAGNOSIS — K219 Gastro-esophageal reflux disease without esophagitis: Secondary | ICD-10-CM | POA: Diagnosis not present

## 2018-10-11 DIAGNOSIS — E559 Vitamin D deficiency, unspecified: Secondary | ICD-10-CM | POA: Diagnosis not present

## 2018-11-24 DIAGNOSIS — R1012 Left upper quadrant pain: Secondary | ICD-10-CM | POA: Diagnosis not present

## 2018-11-24 DIAGNOSIS — I10 Essential (primary) hypertension: Secondary | ICD-10-CM | POA: Diagnosis not present

## 2018-11-24 DIAGNOSIS — J449 Chronic obstructive pulmonary disease, unspecified: Secondary | ICD-10-CM | POA: Diagnosis not present

## 2018-11-24 DIAGNOSIS — R739 Hyperglycemia, unspecified: Secondary | ICD-10-CM | POA: Diagnosis not present

## 2018-11-24 DIAGNOSIS — E785 Hyperlipidemia, unspecified: Secondary | ICD-10-CM | POA: Diagnosis not present

## 2018-11-24 DIAGNOSIS — I509 Heart failure, unspecified: Secondary | ICD-10-CM | POA: Diagnosis not present

## 2018-11-24 DIAGNOSIS — M25512 Pain in left shoulder: Secondary | ICD-10-CM | POA: Diagnosis not present

## 2018-11-25 ENCOUNTER — Other Ambulatory Visit: Payer: Self-pay | Admitting: Internal Medicine

## 2018-11-25 ENCOUNTER — Other Ambulatory Visit (HOSPITAL_COMMUNITY): Payer: Self-pay | Admitting: Internal Medicine

## 2018-11-25 DIAGNOSIS — R109 Unspecified abdominal pain: Secondary | ICD-10-CM

## 2018-11-26 DIAGNOSIS — E119 Type 2 diabetes mellitus without complications: Secondary | ICD-10-CM | POA: Diagnosis not present

## 2018-12-03 ENCOUNTER — Other Ambulatory Visit: Payer: Self-pay

## 2018-12-03 ENCOUNTER — Ambulatory Visit
Admission: RE | Admit: 2018-12-03 | Discharge: 2018-12-03 | Disposition: A | Discharge status: Home / Self Care | Payer: PPO | Source: Ambulatory Visit | Attending: Internal Medicine | Admitting: Internal Medicine

## 2018-12-03 DIAGNOSIS — K7689 Other specified diseases of liver: Secondary | ICD-10-CM | POA: Diagnosis not present

## 2018-12-03 DIAGNOSIS — R109 Unspecified abdominal pain: Secondary | ICD-10-CM | POA: Insufficient documentation

## 2018-12-09 DIAGNOSIS — M549 Dorsalgia, unspecified: Secondary | ICD-10-CM | POA: Diagnosis not present

## 2018-12-09 DIAGNOSIS — I509 Heart failure, unspecified: Secondary | ICD-10-CM | POA: Diagnosis not present

## 2018-12-09 DIAGNOSIS — I872 Venous insufficiency (chronic) (peripheral): Secondary | ICD-10-CM | POA: Diagnosis not present

## 2018-12-09 DIAGNOSIS — I110 Hypertensive heart disease with heart failure: Secondary | ICD-10-CM | POA: Diagnosis not present

## 2018-12-09 DIAGNOSIS — E1140 Type 2 diabetes mellitus with diabetic neuropathy, unspecified: Secondary | ICD-10-CM | POA: Diagnosis not present

## 2018-12-09 DIAGNOSIS — R1012 Left upper quadrant pain: Secondary | ICD-10-CM | POA: Diagnosis not present

## 2018-12-09 DIAGNOSIS — R079 Chest pain, unspecified: Secondary | ICD-10-CM | POA: Diagnosis not present

## 2018-12-09 DIAGNOSIS — E785 Hyperlipidemia, unspecified: Secondary | ICD-10-CM | POA: Diagnosis not present

## 2018-12-09 DIAGNOSIS — G629 Polyneuropathy, unspecified: Secondary | ICD-10-CM | POA: Diagnosis not present

## 2018-12-16 ENCOUNTER — Other Ambulatory Visit
Admission: RE | Admit: 2018-12-16 | Discharge: 2018-12-16 | Disposition: A | Discharge status: Home / Self Care | Payer: PPO | Attending: Internal Medicine | Admitting: Internal Medicine

## 2018-12-16 ENCOUNTER — Other Ambulatory Visit: Payer: Self-pay

## 2018-12-16 DIAGNOSIS — R109 Unspecified abdominal pain: Secondary | ICD-10-CM | POA: Diagnosis not present

## 2018-12-16 DIAGNOSIS — R1012 Left upper quadrant pain: Secondary | ICD-10-CM | POA: Diagnosis not present

## 2018-12-16 DIAGNOSIS — M549 Dorsalgia, unspecified: Secondary | ICD-10-CM | POA: Diagnosis not present

## 2018-12-16 LAB — HEPATIC FUNCTION PANEL
ALT: 22 U/L (ref 0–44)
AST: 23 U/L (ref 15–41)
Albumin: 4.2 g/dL (ref 3.5–5.0)
Alkaline Phosphatase: 61 U/L (ref 38–126)
Bilirubin, Direct: 0.1 mg/dL (ref 0.0–0.2)
Indirect Bilirubin: 0.9 mg/dL (ref 0.3–0.9)
Total Bilirubin: 1 mg/dL (ref 0.3–1.2)
Total Protein: 8.2 g/dL — ABNORMAL HIGH (ref 6.5–8.1)

## 2018-12-16 LAB — LIPASE, BLOOD: Lipase: 27 U/L (ref 11–51)

## 2018-12-20 DIAGNOSIS — M545 Low back pain: Secondary | ICD-10-CM | POA: Diagnosis not present

## 2018-12-20 DIAGNOSIS — M5136 Other intervertebral disc degeneration, lumbar region: Secondary | ICD-10-CM | POA: Diagnosis not present

## 2018-12-20 DIAGNOSIS — M5134 Other intervertebral disc degeneration, thoracic region: Secondary | ICD-10-CM | POA: Diagnosis not present

## 2018-12-20 DIAGNOSIS — M47816 Spondylosis without myelopathy or radiculopathy, lumbar region: Secondary | ICD-10-CM | POA: Diagnosis not present

## 2018-12-22 DIAGNOSIS — N200 Calculus of kidney: Secondary | ICD-10-CM | POA: Diagnosis not present

## 2018-12-22 DIAGNOSIS — R1012 Left upper quadrant pain: Secondary | ICD-10-CM | POA: Diagnosis not present

## 2018-12-27 ENCOUNTER — Other Ambulatory Visit: Payer: Self-pay | Admitting: Internal Medicine

## 2018-12-27 DIAGNOSIS — Z1231 Encounter for screening mammogram for malignant neoplasm of breast: Secondary | ICD-10-CM

## 2018-12-29 DIAGNOSIS — R1012 Left upper quadrant pain: Secondary | ICD-10-CM | POA: Diagnosis not present

## 2018-12-29 DIAGNOSIS — R079 Chest pain, unspecified: Secondary | ICD-10-CM | POA: Diagnosis not present

## 2019-01-03 DIAGNOSIS — R109 Unspecified abdominal pain: Secondary | ICD-10-CM | POA: Diagnosis not present

## 2019-01-03 DIAGNOSIS — R7309 Other abnormal glucose: Secondary | ICD-10-CM | POA: Diagnosis not present

## 2019-01-03 DIAGNOSIS — Z0000 Encounter for general adult medical examination without abnormal findings: Secondary | ICD-10-CM | POA: Diagnosis not present

## 2019-01-03 DIAGNOSIS — R946 Abnormal results of thyroid function studies: Secondary | ICD-10-CM | POA: Diagnosis not present

## 2019-01-10 DIAGNOSIS — I2510 Atherosclerotic heart disease of native coronary artery without angina pectoris: Secondary | ICD-10-CM | POA: Diagnosis not present

## 2019-01-10 DIAGNOSIS — I10 Essential (primary) hypertension: Secondary | ICD-10-CM | POA: Diagnosis not present

## 2019-01-10 DIAGNOSIS — Z0000 Encounter for general adult medical examination without abnormal findings: Secondary | ICD-10-CM | POA: Diagnosis not present

## 2019-01-10 DIAGNOSIS — J449 Chronic obstructive pulmonary disease, unspecified: Secondary | ICD-10-CM | POA: Diagnosis not present

## 2019-01-10 DIAGNOSIS — R7309 Other abnormal glucose: Secondary | ICD-10-CM | POA: Diagnosis not present

## 2019-01-10 DIAGNOSIS — R109 Unspecified abdominal pain: Secondary | ICD-10-CM | POA: Diagnosis not present

## 2019-01-10 DIAGNOSIS — Z7189 Other specified counseling: Secondary | ICD-10-CM | POA: Diagnosis not present

## 2019-01-14 DIAGNOSIS — R0789 Other chest pain: Secondary | ICD-10-CM | POA: Diagnosis not present

## 2019-01-14 DIAGNOSIS — I10 Essential (primary) hypertension: Secondary | ICD-10-CM | POA: Diagnosis not present

## 2019-01-14 DIAGNOSIS — I5032 Chronic diastolic (congestive) heart failure: Secondary | ICD-10-CM | POA: Diagnosis not present

## 2019-01-14 DIAGNOSIS — E782 Mixed hyperlipidemia: Secondary | ICD-10-CM | POA: Diagnosis not present

## 2019-01-14 DIAGNOSIS — E119 Type 2 diabetes mellitus without complications: Secondary | ICD-10-CM | POA: Diagnosis not present

## 2019-01-31 ENCOUNTER — Ambulatory Visit
Admission: RE | Admit: 2019-01-31 | Discharge: 2019-01-31 | Disposition: A | Discharge status: Home / Self Care | Payer: PPO | Source: Ambulatory Visit | Attending: Internal Medicine | Admitting: Internal Medicine

## 2019-01-31 ENCOUNTER — Other Ambulatory Visit: Payer: Self-pay

## 2019-01-31 DIAGNOSIS — Z1231 Encounter for screening mammogram for malignant neoplasm of breast: Secondary | ICD-10-CM | POA: Diagnosis not present

## 2019-02-07 DIAGNOSIS — Z20828 Contact with and (suspected) exposure to other viral communicable diseases: Secondary | ICD-10-CM | POA: Diagnosis not present

## 2019-02-08 DIAGNOSIS — Z20828 Contact with and (suspected) exposure to other viral communicable diseases: Secondary | ICD-10-CM | POA: Diagnosis not present

## 2019-02-09 DIAGNOSIS — E538 Deficiency of other specified B group vitamins: Secondary | ICD-10-CM | POA: Diagnosis not present

## 2019-02-09 DIAGNOSIS — E559 Vitamin D deficiency, unspecified: Secondary | ICD-10-CM | POA: Diagnosis not present

## 2019-02-09 DIAGNOSIS — E119 Type 2 diabetes mellitus without complications: Secondary | ICD-10-CM | POA: Diagnosis not present

## 2019-02-09 DIAGNOSIS — M792 Neuralgia and neuritis, unspecified: Secondary | ICD-10-CM | POA: Diagnosis not present

## 2019-02-11 ENCOUNTER — Other Ambulatory Visit: Payer: Self-pay | Admitting: Neurology

## 2019-02-11 DIAGNOSIS — M792 Neuralgia and neuritis, unspecified: Secondary | ICD-10-CM

## 2019-02-23 ENCOUNTER — Other Ambulatory Visit: Payer: Self-pay

## 2019-02-23 ENCOUNTER — Ambulatory Visit
Admission: RE | Admit: 2019-02-23 | Discharge: 2019-02-23 | Disposition: A | Discharge status: Home / Self Care | Payer: PPO | Source: Ambulatory Visit | Attending: Neurology | Admitting: Neurology

## 2019-02-23 DIAGNOSIS — M4804 Spinal stenosis, thoracic region: Secondary | ICD-10-CM | POA: Diagnosis not present

## 2019-02-23 DIAGNOSIS — M792 Neuralgia and neuritis, unspecified: Secondary | ICD-10-CM | POA: Diagnosis not present

## 2019-03-08 ENCOUNTER — Other Ambulatory Visit: Payer: Self-pay | Admitting: Neurology

## 2019-03-08 DIAGNOSIS — R911 Solitary pulmonary nodule: Secondary | ICD-10-CM

## 2019-03-17 ENCOUNTER — Other Ambulatory Visit: Payer: Self-pay

## 2019-03-17 ENCOUNTER — Ambulatory Visit
Admission: RE | Admit: 2019-03-17 | Discharge: 2019-03-17 | Disposition: A | Discharge status: Home / Self Care | Payer: PPO | Source: Ambulatory Visit | Attending: Neurology | Admitting: Neurology

## 2019-03-17 DIAGNOSIS — R911 Solitary pulmonary nodule: Secondary | ICD-10-CM | POA: Diagnosis not present

## 2019-04-04 DIAGNOSIS — R7309 Other abnormal glucose: Secondary | ICD-10-CM | POA: Diagnosis not present

## 2019-04-04 DIAGNOSIS — I10 Essential (primary) hypertension: Secondary | ICD-10-CM | POA: Diagnosis not present

## 2019-04-18 ENCOUNTER — Other Ambulatory Visit: Payer: Self-pay | Admitting: *Deleted

## 2019-04-18 DIAGNOSIS — Z23 Encounter for immunization: Secondary | ICD-10-CM | POA: Diagnosis not present

## 2019-04-18 DIAGNOSIS — J449 Chronic obstructive pulmonary disease, unspecified: Secondary | ICD-10-CM | POA: Diagnosis not present

## 2019-04-18 DIAGNOSIS — I10 Essential (primary) hypertension: Secondary | ICD-10-CM | POA: Diagnosis not present

## 2019-04-18 DIAGNOSIS — L0292 Furuncle, unspecified: Secondary | ICD-10-CM | POA: Diagnosis not present

## 2019-04-18 DIAGNOSIS — Z20822 Contact with and (suspected) exposure to covid-19: Secondary | ICD-10-CM

## 2019-04-18 DIAGNOSIS — I2510 Atherosclerotic heart disease of native coronary artery without angina pectoris: Secondary | ICD-10-CM | POA: Diagnosis not present

## 2019-04-18 DIAGNOSIS — R7309 Other abnormal glucose: Secondary | ICD-10-CM | POA: Diagnosis not present

## 2019-04-19 LAB — NOVEL CORONAVIRUS, NAA: SARS-CoV-2, NAA: NOT DETECTED

## 2019-05-09 ENCOUNTER — Other Ambulatory Visit: Payer: Self-pay

## 2019-05-09 ENCOUNTER — Ambulatory Visit: Payer: PPO | Admitting: Pulmonary Disease

## 2019-05-09 ENCOUNTER — Encounter: Payer: Self-pay | Admitting: Pulmonary Disease

## 2019-05-09 VITALS — BP 124/70 | HR 78 | Temp 98.0°F | Ht 63.0 in | Wt 242.0 lb

## 2019-05-09 DIAGNOSIS — R0602 Shortness of breath: Secondary | ICD-10-CM

## 2019-05-09 DIAGNOSIS — I5189 Other ill-defined heart diseases: Secondary | ICD-10-CM

## 2019-05-09 DIAGNOSIS — J449 Chronic obstructive pulmonary disease, unspecified: Secondary | ICD-10-CM

## 2019-05-09 MED ORDER — ANORO ELLIPTA 62.5-25 MCG/INH IN AEPB: 1.0000 | INHALATION_SPRAY | Freq: Every day | RESPIRATORY_TRACT | 0 refills | Status: AC

## 2019-05-09 MED ORDER — ANORO ELLIPTA 62.5-25 MCG/INH IN AEPB: 1.0000 | INHALATION_SPRAY | Freq: Every day | RESPIRATORY_TRACT | 6 refills | Status: DC

## 2019-05-09 NOTE — Progress Notes (Signed)
Subjective:    Patient ID: Sarah [name], female    DOB: 04/18/1951, 68 y.o.   MRN: [id number]  HPI 68 year old female follows here with moderate COPD last PFTs 2017, FEV1 1.59 L or 83% predicted FEV1/FVC 67% and diffusion capacity 78%.  She has issues with dyspnea on exertion which is at baseline.  She does note increased dyspnea with sexual activity and sometimes feels like she "gurgles" with this activity.  I have reviewed her 2D echocardiograms from prior and she has had issues with diastolic dysfunction.  It may be that this is what causes more of her issues with exertion.  She has been on Spiriva however she is on the "donut hole" and has not been able to get it.  She tries to stay active and can do her activities of daily living at home without difficulty.  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  She has not smoked since 1997.  She does not endorse any fevers, chills or sweats.  No significant cough or sputum production.  No hemoptysis.  No wheezing.  She does note the occasional "gurgling" particularly with activity as noted above.  Pulmonary function tests 04/29/2016 FVC 98%, 2.39 L FEV1 82%, 1.59 L FEV1/FVC 67% FRC 51% RV 57% TLC 71% RV/TLC 81% ERV 71% DLCO 70% Impression: Mild obstruction, moderate restriction, mixed obstruction and restriction noted.  Suspect that the restriction is due to obesity (BMI 42.8)   Review of Systems A 10 point review of systems was performed and it is as noted above otherwise negative.    Objective:   Physical Exam BP 124/70 (BP Location: Left Arm, Cuff Size: Normal)   Pulse 78   Temp 98 F (36.7 C) (Temporal)   Ht 5\' 3"  (1.6 m)   Wt 242 lb (109.8 kg)   SpO2 100%   BMI 42.87 kg/m  General Appearance: Obese woman, well developed, no respiratory distress. Neuro:without focal findings, mental status, speech normal, alert and oriented, cooperative HEENT: PERRLA, no scleral  icterus, nose/mouth/throat not examined due to masking requirements for COVID 19. Pulmonary: No wheezing, No rales, good air entry.  Coarse breath sounds in general. CardiovascularNormal S1,S2.  No m/r/g.  Abdomen: Benign, protuberant, nondistended Renal:  No costovertebral tenderness  Endoc: No evident thyromegaly, no signs of acromegaly or Cushing features Skin:   warm, no rashes, no ecchymosis  Extremities: normal, no cyanosis, clubbing.  Trace edema at the level of the ankles.     Assessment & Plan:   1.  Shortness of breath with exertion: We will review pulmonary function testing to determine if she has had any deterioration in her lung function.  She has not had these test since 2017.  We will also obtain a 2D echo as she has significant diastolic dysfunction and some of the issues she is describing may be related to worsening diastolic dysfunction rather than pulmonary issue.  2.  Obstructive pulmonary disease, moderate: She is currently not poorly compensated as she is off of her Spiriva due to cost.  We will try Anoro Ellipta 1 inhalation daily.  She was provided 2 weeks worth of samples and a prescription was sent to her pharmacy.  Hopefully this will have better coverage.  3.  Diastolic dysfunction of the left ventricle: This issue adds complexity to her management vis--vis her shortness of breath.  2D echo will be repeated to reassess this.  4.  Obesity, morbid, BMI 42.8:  This issue adds complexity to her management vis--vis her shortness of breath.  Weight loss was encouraged.   Patient is up-to-date on her flu vaccine.   Renold Don, MD [facility]  This note was dictated using voice recognition software/Dragon.  Despite best efforts to proofread, errors can occur which can change the meaning.  Any change was purely unintentional.

## 2019-05-09 NOTE — Patient Instructions (Signed)
1.  We will give you a trial of Anoro Ellipta 1 inhalation daily.  This is to replace Spiriva.  2.  Order breathing test and an echocardiogram to reevaluate your shortness of breath.  3.  We will see her in follow-up in 2 months time.

## 2019-05-21 DIAGNOSIS — Z20828 Contact with and (suspected) exposure to other viral communicable diseases: Secondary | ICD-10-CM | POA: Diagnosis not present

## 2019-05-23 ENCOUNTER — Other Ambulatory Visit: Payer: Self-pay

## 2019-05-23 ENCOUNTER — Ambulatory Visit
Admission: RE | Admit: 2019-05-23 | Discharge: 2019-05-23 | Disposition: A | Discharge status: Home / Self Care | Payer: PPO | Source: Ambulatory Visit | Attending: Pulmonary Disease | Admitting: Pulmonary Disease

## 2019-05-23 DIAGNOSIS — J449 Chronic obstructive pulmonary disease, unspecified: Secondary | ICD-10-CM | POA: Insufficient documentation

## 2019-05-23 DIAGNOSIS — I509 Heart failure, unspecified: Secondary | ICD-10-CM | POA: Diagnosis not present

## 2019-05-23 DIAGNOSIS — I313 Pericardial effusion (noninflammatory): Secondary | ICD-10-CM | POA: Diagnosis not present

## 2019-05-23 DIAGNOSIS — R0602 Shortness of breath: Secondary | ICD-10-CM | POA: Diagnosis not present

## 2019-05-23 NOTE — Progress Notes (Signed)
*  PRELIMINARY RESULTS* Echocardiogram 2D Echocardiogram has been performed.  Sherrie Sport 05/23/2019, 11:37 AM

## 2019-06-13 ENCOUNTER — Ambulatory Visit: Payer: PPO | Admitting: Pulmonary Disease

## 2019-06-28 ENCOUNTER — Ambulatory Visit: Payer: PPO | Admitting: Pulmonary Disease

## 2019-06-28 DIAGNOSIS — R0609 Other forms of dyspnea: Secondary | ICD-10-CM

## 2019-06-28 DIAGNOSIS — I5189 Other ill-defined heart diseases: Secondary | ICD-10-CM

## 2019-06-28 DIAGNOSIS — R0600 Dyspnea, unspecified: Secondary | ICD-10-CM

## 2019-06-28 DIAGNOSIS — J449 Chronic obstructive pulmonary disease, unspecified: Secondary | ICD-10-CM

## 2019-06-28 NOTE — Progress Notes (Signed)
    Assessment & Plan:  1. COPD mixed type (HCC) (Primary)  2. Diastolic dysfunction  3. DOE (dyspnea on exertion)   Patient Instructions  1.  We will reconvene in 2 months time.  2.  Continue Spiriva  for now  3.  Hopefully will have breathing test prior to the next visit.  Please note: late entry documentation due to logistical difficulties during COVID-19 pandemic. This note is filed for information purposes only, and is not intended to be used for billing, nor does it represent the full scope/nature of the visit in question. Please see any associated scanned media linked to date of encounter for additional pertinent information.  Subjective:    HPI: Sarah [name] is a 69 y.o. female presenting to the pulmonology clinic on 06/28/2019 with report of: No chief complaint on file.     Outpatient Encounter Medications as of 06/28/2019  Medication Sig   acetaminophen  (TYLENOL ) 500 MG tablet Take 500 mg by mouth every 6 (six) hours as needed for moderate pain.   aspirin  81 MG tablet Take 81 mg by mouth daily.   carvedilol  (COREG ) 12.5 MG tablet Take 12.5 mg by mouth 2 (two) times daily with a meal.   simvastatin  (ZOCOR ) 40 MG tablet Take 40 mg by mouth at bedtime.   [DISCONTINUED] Ascorbic Acid  (VITAMIN C) 100 MG tablet Take 100 mg by mouth daily.   [DISCONTINUED] furosemide  (LASIX ) 20 MG tablet Take 20 mg by mouth daily.   [DISCONTINUED] gentamicin cream (GARAMYCIN) 0.1 % APPLY A COPIOUS AMOUNT TWICE A DAY TO AFFECTED AREA EXTERNALLY 7 DAYS   [DISCONTINUED] Glucosamine-Chondroitin (OSTEO BI-FLEX REGULAR STRENGTH PO) Take 1 tablet by mouth daily.   [DISCONTINUED] hydrocortisone  (ANUSOL -HC) 25 MG suppository Place 1 suppository (25 mg total) rectally 2 (two) times daily.   [DISCONTINUED] ibuprofen  (ADVIL ,MOTRIN ) 800 MG tablet Take 1 tablet (800 mg total) by mouth every 8 (eight) hours as needed.   [DISCONTINUED] isosorbide  mononitrate (IMDUR ) 30 MG 24 hr tablet Take 30 mg by mouth  daily.   [DISCONTINUED] metFORMIN (GLUCOPHAGE) 500 MG tablet Take 500 mg by mouth 2 (two) times daily with a meal.    [DISCONTINUED] ondansetron  (ZOFRAN -ODT) 4 MG disintegrating tablet Take by mouth.   [DISCONTINUED] pramoxine-hydrocortisone  (PROCTOCREAM-HC) 1-1 % rectal cream Place 1 application rectally 2 (two) times daily.   [DISCONTINUED] PROAIR  HFA 108 (90 Base) MCG/ACT inhaler INHALE TWO PUFFS BY MOUTH EVERY 2 TO 4 HOURS AS NEEDED (Patient not taking: Reported on 04/08/2021)   [DISCONTINUED] traMADol  (ULTRAM ) 50 MG tablet TAKE 1 TABLET BY MOUTH 3 TIMES DAILY AS NEEDED FOR UP TO 3 DAYS   [DISCONTINUED] umeclidinium-vilanterol (ANORO ELLIPTA ) 62.5-25 MCG/INH AEPB Inhale 1 puff into the lungs daily.   No facility-administered encounter medications on file as of 06/28/2019.      Objective:   There were no vitals filed for this visit.   Physical exam documentation is limited by delayed entry of information.

## 2019-06-28 NOTE — Patient Instructions (Signed)
1.  We will reconvene in 2 months time.  2.  Continue Spiriva for now  3.  Hopefully will have breathing test prior to the next visit.

## 2019-07-07 ENCOUNTER — Ambulatory Visit: Payer: PPO | Attending: Critical Care Medicine

## 2019-07-07 DIAGNOSIS — Z23 Encounter for immunization: Secondary | ICD-10-CM | POA: Insufficient documentation

## 2019-07-11 DIAGNOSIS — I10 Essential (primary) hypertension: Secondary | ICD-10-CM | POA: Diagnosis not present

## 2019-07-11 DIAGNOSIS — R7309 Other abnormal glucose: Secondary | ICD-10-CM | POA: Diagnosis not present

## 2019-07-11 DIAGNOSIS — I2510 Atherosclerotic heart disease of native coronary artery without angina pectoris: Secondary | ICD-10-CM | POA: Diagnosis not present

## 2019-07-11 NOTE — Progress Notes (Signed)
   Covid-19 Vaccination Clinic  Name:  Sarah [name]    MRN: [id number] DOB: 1950/12/30  07/07/2019  Ms. Sarah [name] was observed post Covid-19 immunization for 15 minutes without incidence. She was provided with Vaccine Information Sheet and instruction to access the V-Safe system.   Ms. Sarah [name] was instructed to call 911 with any severe reactions post vaccine: Marland Kitchen Difficulty breathing  . Swelling of your face and throat  . A fast heartbeat  . A bad rash all over your body  . Dizziness and weakness    Immunizations Administered    Name Date Dose VIS Date Route   Moderna COVID-19 Vaccine 07/07/2019  7:00 PM 0.5 mL 05/17/2019 Intramuscular   Manufacturer: Moderna   Lot: [id number]   NDC: 90240-973-53

## 2019-07-18 DIAGNOSIS — I5030 Unspecified diastolic (congestive) heart failure: Secondary | ICD-10-CM | POA: Diagnosis not present

## 2019-07-18 DIAGNOSIS — I5032 Chronic diastolic (congestive) heart failure: Secondary | ICD-10-CM | POA: Diagnosis not present

## 2019-07-18 DIAGNOSIS — R7309 Other abnormal glucose: Secondary | ICD-10-CM | POA: Diagnosis not present

## 2019-07-18 DIAGNOSIS — Z7189 Other specified counseling: Secondary | ICD-10-CM | POA: Diagnosis not present

## 2019-07-18 DIAGNOSIS — E119 Type 2 diabetes mellitus without complications: Secondary | ICD-10-CM | POA: Diagnosis not present

## 2019-07-18 DIAGNOSIS — E782 Mixed hyperlipidemia: Secondary | ICD-10-CM | POA: Diagnosis not present

## 2019-07-18 DIAGNOSIS — I10 Essential (primary) hypertension: Secondary | ICD-10-CM | POA: Diagnosis not present

## 2019-07-18 DIAGNOSIS — I2510 Atherosclerotic heart disease of native coronary artery without angina pectoris: Secondary | ICD-10-CM | POA: Diagnosis not present

## 2019-07-18 DIAGNOSIS — J449 Chronic obstructive pulmonary disease, unspecified: Secondary | ICD-10-CM | POA: Diagnosis not present

## 2019-07-21 ENCOUNTER — Ambulatory Visit: Payer: PPO

## 2019-07-25 ENCOUNTER — Ambulatory Visit: Payer: PPO | Attending: Internal Medicine

## 2019-07-25 DIAGNOSIS — Z20822 Contact with and (suspected) exposure to covid-19: Secondary | ICD-10-CM | POA: Diagnosis not present

## 2019-07-26 LAB — NOVEL CORONAVIRUS, NAA: SARS-CoV-2, NAA: NOT DETECTED

## 2019-07-29 ENCOUNTER — Other Ambulatory Visit: Payer: Self-pay

## 2019-07-29 ENCOUNTER — Ambulatory Visit: Payer: PPO | Attending: Internal Medicine

## 2019-07-29 DIAGNOSIS — Z23 Encounter for immunization: Secondary | ICD-10-CM | POA: Insufficient documentation

## 2019-07-29 NOTE — Progress Notes (Signed)
   Covid-19 Vaccination Clinic  Name:  Sarah [name]    MRN: [id number] DOB: 07/08/1950  07/29/2019  [name] was observed post Covid-19 immunization for 15 minutes without incidence. She was provided with Vaccine Information Sheet and instruction to access the V-Safe system.   [name] was instructed to call 911 with any severe reactions post vaccine: Marland Kitchen Difficulty breathing  . Swelling of your face and throat  . A fast heartbeat  . A bad rash all over your body  . Dizziness and weakness    Immunizations Administered    Name Date Dose VIS Date Route   Moderna COVID-19 Vaccine 07/29/2019 12:19 PM 0.5 mL 05/17/2019 Intramuscular   Manufacturer: Moderna   Lot: [id number]   NDC: 67341-937-90

## 2019-08-01 ENCOUNTER — Other Ambulatory Visit: Payer: Self-pay

## 2019-08-19 DIAGNOSIS — Z23 Encounter for immunization: Secondary | ICD-10-CM | POA: Diagnosis not present

## 2019-08-29 ENCOUNTER — Ambulatory Visit: Payer: PPO | Attending: Internal Medicine

## 2019-08-29 ENCOUNTER — Ambulatory Visit: Payer: PPO

## 2019-08-29 DIAGNOSIS — Z23 Encounter for immunization: Secondary | ICD-10-CM

## 2019-08-29 NOTE — Progress Notes (Signed)
   Covid-19 Vaccination Clinic  Name:  Sarah [name]    MRN: [id number] DOB: 07/16/1950  08/29/2019  Sarah [name] was observed post Covid-19 immunization for 15 minutes without incident. She was provided with Vaccine Information Sheet and instruction to access the V-Safe system.   Sarah [name] was instructed to call 911 with any severe reactions post vaccine: Marland Kitchen Difficulty breathing  . Swelling of face and throat  . A fast heartbeat  . A bad rash all over body  . Dizziness and weakness   Immunizations Administered    Name Date Dose VIS Date Route   Moderna COVID-19 Vaccine 08/29/2019 12:12 PM 0.5 mL 05/17/2019 Intramuscular   Manufacturer: Moderna   Lot: [id number]   NDC: 14782-956-21

## 2019-09-09 ENCOUNTER — Ambulatory Visit: Payer: PPO | Admitting: Pulmonary Disease

## 2019-10-10 ENCOUNTER — Other Ambulatory Visit: Payer: Self-pay

## 2019-10-10 ENCOUNTER — Ambulatory Visit: Payer: PPO | Admitting: Pulmonary Disease

## 2019-10-10 ENCOUNTER — Encounter: Payer: Self-pay | Admitting: Pulmonary Disease

## 2019-10-10 VITALS — BP 120/78 | HR 72 | Ht 64.0 in | Wt 233.8 lb

## 2019-10-10 DIAGNOSIS — I5030 Unspecified diastolic (congestive) heart failure: Secondary | ICD-10-CM | POA: Diagnosis not present

## 2019-10-10 DIAGNOSIS — I2510 Atherosclerotic heart disease of native coronary artery without angina pectoris: Secondary | ICD-10-CM | POA: Diagnosis not present

## 2019-10-10 DIAGNOSIS — R0602 Shortness of breath: Secondary | ICD-10-CM | POA: Diagnosis not present

## 2019-10-10 DIAGNOSIS — J410 Simple chronic bronchitis: Secondary | ICD-10-CM | POA: Diagnosis not present

## 2019-10-10 DIAGNOSIS — R7309 Other abnormal glucose: Secondary | ICD-10-CM | POA: Diagnosis not present

## 2019-10-10 DIAGNOSIS — I10 Essential (primary) hypertension: Secondary | ICD-10-CM | POA: Diagnosis not present

## 2019-10-10 DIAGNOSIS — J449 Chronic obstructive pulmonary disease, unspecified: Secondary | ICD-10-CM

## 2019-10-10 NOTE — Patient Instructions (Signed)
We will check to see what is delaying the breathing test.  Continue Spiriva.  We will see you in follow-up in 6 months time call sooner should any new problems arise.

## 2019-10-10 NOTE — Progress Notes (Signed)
Subjective:    Patient ID: Sarah [name], female    DOB: 01/10/1951, 69 y.o.   MRN: [id number]  HPI Patient is a 69 year old former smoker and follows here for moderate COPD and dyspnea.  This is a scheduled visit.  Prior visit was on 28 June 2019.  Have repeat PFTs.  Prior PFTs are as noted below.  She had a trial of Anoro Ellipta previously which she did not tolerate.  She continues on Spiriva HandiHaler 1 cap inhaled daily.  She also uses as needed albuterol but rarely has need for it.  She has completed her COVID 19 vaccines with Moderna vaccine.  In the past there has been a question about a lung nodule however October 2020 CT scan of the chest showed no lesion whatsoever.  She has noted that she has been breathing better and is tolerating activity much better.  She does not endorse any particular complaint today.  She continues to do well with the Spiriva when she is consistent in taking it.  She has not had any cough or sputum production.  Dyspnea is at baseline to improved.  No hemoptysis.  No chest pain, orthopnea, paroxysmal nocturnal dyspnea or lower extremity edema.  No calf tenderness.  DATA: Pulmonary function tests 04/29/2016 FVC 98%, 2.39 L FEV1 82%, 1.59 L FEV1/FVC 67% FRC 51% RV 57% TLC 71% RV/TLC 81% ERV 71% DLCO 70% Impression: Mild obstruction, moderate restriction, mixed obstruction and restriction noted.  Suspect that the restriction is due to obesity (BMI 42.8)  03/17/2019 CT chest: No parenchymal abnormalities, specifically no lung nodule.  No emphysema.  Review of Systems A 10 point review of systems was performed and it is as noted above otherwise negative.  Patient Active Problem List   Diagnosis Date Noted  . Osteoarthritis of knee 07/09/2017  . Patellofemoral stress syndrome 07/09/2017  . Venous insufficiency of both lower extremities 02/11/2017  . Chronic diastolic CHF (congestive heart failure), NYHA class 2 ([place]) 05/12/2016  . Controlled type  2 diabetes mellitus without complication, without long-term current use of insulin ([place]) 05/12/2016  . Benign essential HTN 04/04/2016  . Mixed hyperlipidemia 04/04/2016  . Other diseases of stomach and duodenum   . Problems with swallowing and mastication   . Left buttock abscess 12/15/2014  . DOE (dyspnea on exertion) 06/05/2014  . COPD type A ([place]) 06/05/2014  . Obesity 06/05/2014   Social History   Tobacco Use  . Smoking status: Former Smoker    Packs/day: 0.50    Years: 27.00    Pack years: 13.50    Types: Cigarettes    Quit date: 01/15/1996    Years since quitting: 24.7  . Smokeless tobacco: Never Used  Substance Use Topics  . Alcohol use: Yes    Alcohol/week: 0.0 [name] drinks    Comment: WINE WEEKLY   No Known Allergies  Current Meds  Medication Sig  . acetaminophen (TYLENOL) 500 MG tablet Take 500 mg by mouth as needed.  . Ascorbic Acid (VITAMIN C) 100 MG tablet Take 100 mg by mouth daily.  Marland Kitchen aspirin 81 MG tablet Take 81 mg by mouth daily.  . carvedilol (COREG) 12.5 MG tablet Take 12.5 mg by mouth 2 (two) times daily with a meal. Reported on 10/08/2015  . furosemide (LASIX) 20 MG tablet Take 20 mg by mouth daily.  Marland Kitchen gentamicin cream (GARAMYCIN) 0.1 % APPLY A COPIOUS AMOUNT TWICE A DAY TO AFFECTED AREA EXTERNALLY 7 DAYS  . Glucosamine-Chondroitin (OSTEO BI-FLEX REGULAR STRENGTH  PO) Take 1 tablet by mouth daily.  . isosorbide mononitrate (IMDUR) 30 MG 24 hr tablet Take 30 mg by mouth daily.  . metFORMIN (GLUCOPHAGE) 500 MG tablet Take 500 mg by mouth 2 (two) times daily with a meal.   . PROAIR HFA 108 (90 Base) MCG/ACT inhaler INHALE TWO PUFFS BY MOUTH EVERY 2 TO 4 HOURS AS NEEDED  . simvastatin (ZOCOR) 40 MG tablet Take 40 mg by mouth daily at 6 PM.   . SPIRIVA HANDIHALER 18 MCG inhalation capsule Place 18 mcg into inhaler and inhale daily.   . valsartan (DIOVAN) 80 MG tablet Take 80 mg by mouth daily.   Immunization History  Administered Date(s) Administered  .  Influenza, High Dose Seasonal PF 04/09/2018  . Influenza-Unspecified 03/02/2014, 04/18/2019  . Moderna Sars-Covid-2 Vaccination 07/07/2019, 07/29/2019, 08/29/2019  . Pneumococcal Polysaccharide-23 05/17/2015      Objective:   Physical Exam BP 120/78 (BP Location: Right Arm, Cuff Size: Normal)   Pulse 72   Ht 5\' 4"  (1.626 m)   Wt 233 lb 12.8 oz (106.1 kg)   SpO2 99%   BMI 40.13 kg/m   General Appearance: Obese woman, well developed, no respiratory distress. Neuro:without focal findings, mental status, speech normal, alert and oriented, cooperative HEENT: PERRLA, no scleral icterus, nose/mouth/throat not examined due to masking requirements for COVID 19. Pulmonary: No wheezing, No rales, good air entry.  Coarse breath sounds in general. CardiovascularNormal S1,S2. No m/r/g.  Abdomen: Benign, protuberant, nondistended Renal: No costovertebral tenderness  Endoc: No evident thyromegaly, no signs of acromegaly or Cushing features Skin: warm, no rashes, no ecchymosis  Extremities: normal, no cyanosis, clubbing.    No edema noted.    Assessment & Plan:     ICD-10-CM   1. Simple chronic bronchitis (HCC)  J41.0    Appears to be well compensated at present Moderate COPD by PFTs Repeat PFTs when lab back open  2. Shortness of breath  R06.02    Improved Does better when can take Spiriva regularly   Patient is to continue Spiriva and as needed albuterol as they are.  We will see her in follow-up in 6 months time she is to contact prior to that time should any new difficulties arise.  [place], MD [facility]   *This note was dictated using voice recognition software/Dragon.  Despite best efforts to proofread, errors can occur which can change the meaning.  Any change was purely unintentional.

## 2019-10-17 DIAGNOSIS — I10 Essential (primary) hypertension: Secondary | ICD-10-CM | POA: Diagnosis not present

## 2019-10-17 DIAGNOSIS — I5030 Unspecified diastolic (congestive) heart failure: Secondary | ICD-10-CM | POA: Diagnosis not present

## 2019-10-17 DIAGNOSIS — J309 Allergic rhinitis, unspecified: Secondary | ICD-10-CM | POA: Diagnosis not present

## 2019-10-17 DIAGNOSIS — J449 Chronic obstructive pulmonary disease, unspecified: Secondary | ICD-10-CM | POA: Diagnosis not present

## 2019-10-17 DIAGNOSIS — R634 Abnormal weight loss: Secondary | ICD-10-CM | POA: Diagnosis not present

## 2019-10-17 DIAGNOSIS — I2510 Atherosclerotic heart disease of native coronary artery without angina pectoris: Secondary | ICD-10-CM | POA: Diagnosis not present

## 2019-10-17 DIAGNOSIS — E559 Vitamin D deficiency, unspecified: Secondary | ICD-10-CM | POA: Diagnosis not present

## 2019-10-17 DIAGNOSIS — R7309 Other abnormal glucose: Secondary | ICD-10-CM | POA: Diagnosis not present

## 2019-12-30 DIAGNOSIS — E119 Type 2 diabetes mellitus without complications: Secondary | ICD-10-CM | POA: Diagnosis not present

## 2020-01-04 ENCOUNTER — Other Ambulatory Visit: Payer: Self-pay | Admitting: Family Medicine

## 2020-01-04 DIAGNOSIS — Z1231 Encounter for screening mammogram for malignant neoplasm of breast: Secondary | ICD-10-CM

## 2020-01-16 DIAGNOSIS — E559 Vitamin D deficiency, unspecified: Secondary | ICD-10-CM | POA: Diagnosis not present

## 2020-01-16 DIAGNOSIS — R634 Abnormal weight loss: Secondary | ICD-10-CM | POA: Diagnosis not present

## 2020-01-16 DIAGNOSIS — I10 Essential (primary) hypertension: Secondary | ICD-10-CM | POA: Diagnosis not present

## 2020-01-16 DIAGNOSIS — E782 Mixed hyperlipidemia: Secondary | ICD-10-CM | POA: Diagnosis not present

## 2020-01-16 DIAGNOSIS — E119 Type 2 diabetes mellitus without complications: Secondary | ICD-10-CM | POA: Diagnosis not present

## 2020-01-16 DIAGNOSIS — R7309 Other abnormal glucose: Secondary | ICD-10-CM | POA: Diagnosis not present

## 2020-01-16 DIAGNOSIS — I2510 Atherosclerotic heart disease of native coronary artery without angina pectoris: Secondary | ICD-10-CM | POA: Diagnosis not present

## 2020-01-16 DIAGNOSIS — I5032 Chronic diastolic (congestive) heart failure: Secondary | ICD-10-CM | POA: Diagnosis not present

## 2020-01-23 DIAGNOSIS — R7309 Other abnormal glucose: Secondary | ICD-10-CM | POA: Diagnosis not present

## 2020-01-23 DIAGNOSIS — Z0000 Encounter for general adult medical examination without abnormal findings: Secondary | ICD-10-CM | POA: Diagnosis not present

## 2020-01-23 DIAGNOSIS — J449 Chronic obstructive pulmonary disease, unspecified: Secondary | ICD-10-CM | POA: Diagnosis not present

## 2020-01-23 DIAGNOSIS — I2510 Atherosclerotic heart disease of native coronary artery without angina pectoris: Secondary | ICD-10-CM | POA: Diagnosis not present

## 2020-01-23 DIAGNOSIS — I10 Essential (primary) hypertension: Secondary | ICD-10-CM | POA: Diagnosis not present

## 2020-01-23 DIAGNOSIS — R32 Unspecified urinary incontinence: Secondary | ICD-10-CM | POA: Diagnosis not present

## 2020-02-06 ENCOUNTER — Other Ambulatory Visit: Payer: Self-pay

## 2020-02-06 ENCOUNTER — Ambulatory Visit
Admission: RE | Admit: 2020-02-06 | Discharge: 2020-02-06 | Disposition: A | Discharge status: Home / Self Care | Payer: PPO | Source: Ambulatory Visit | Attending: Family Medicine | Admitting: Family Medicine

## 2020-02-06 DIAGNOSIS — Z1231 Encounter for screening mammogram for malignant neoplasm of breast: Secondary | ICD-10-CM | POA: Insufficient documentation

## 2020-02-27 DIAGNOSIS — L0292 Furuncle, unspecified: Secondary | ICD-10-CM | POA: Diagnosis not present

## 2020-03-02 DIAGNOSIS — L0292 Furuncle, unspecified: Secondary | ICD-10-CM | POA: Diagnosis not present

## 2020-03-05 DIAGNOSIS — L0292 Furuncle, unspecified: Secondary | ICD-10-CM | POA: Diagnosis not present

## 2020-03-23 IMAGING — MR MR THORACIC SPINE W/O CM
6 series · 33 of 48 positions shown · non-contrast
Comparison: X-ray 02/11/2018

CLINICAL DATA: Back pain

EXAM:
MRI THORACIC SPINE WITHOUT CONTRAST
TECHNIQUE: Multiplanar, multisequence MR imaging of the thoracic spine was
performed. No intravenous contrast was administered.

[Series 16: T1 · sagittal · 5.0mm · 1.88mm/px · 2 of 9 slices shown (1 of 2)]
[im 1/9]
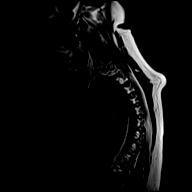
[im 9/9]
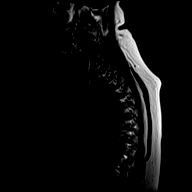

[Series 17: T2 · sagittal · 3.0mm · 1.33mm/px · 6 of 17 slices shown (1 of 2)]
[im 1/17]
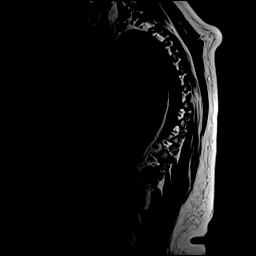
[im 4/17]
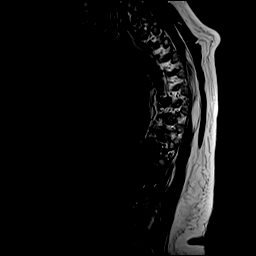
[im 7/17]
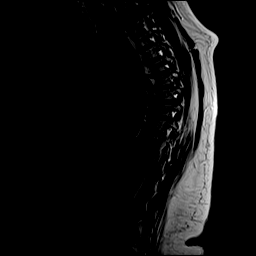
[im 10/17]
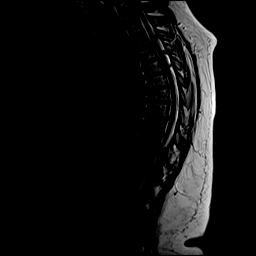
[im 13/17]
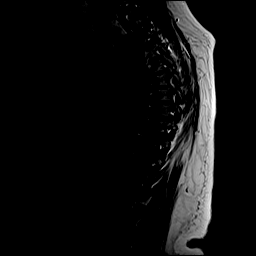
[im 17/17]
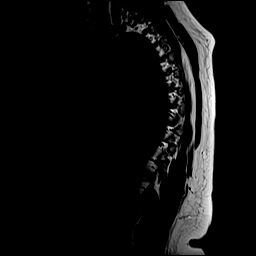

[Series 18: T1 · sagittal · 3.0mm · 1.33mm/px · 6 of 17 slices shown (2 of 2)]
[im 1/17]
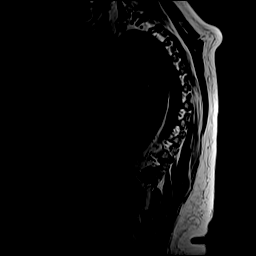
[im 4/17]
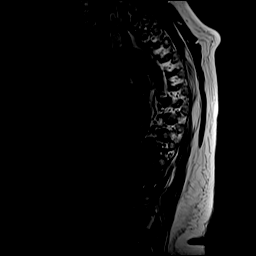
[im 7/17]
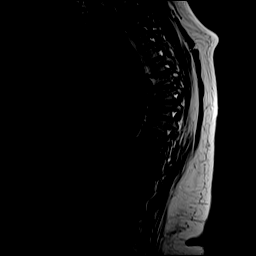
[im 10/17]
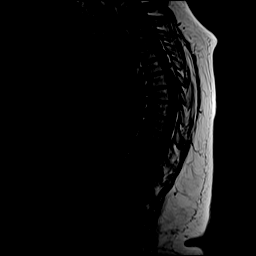
[im 13/17]
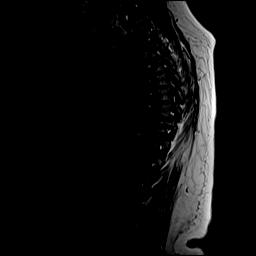
[im 17/17]
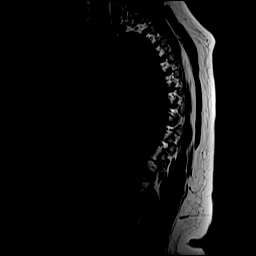

[Series 19: STIR · sagittal · 3.0mm · 0.66mm/px · 6 of 17 slices shown]
[im 1/17]
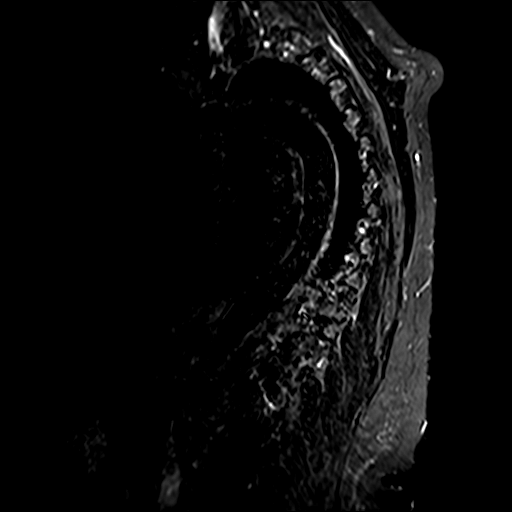
[im 4/17]
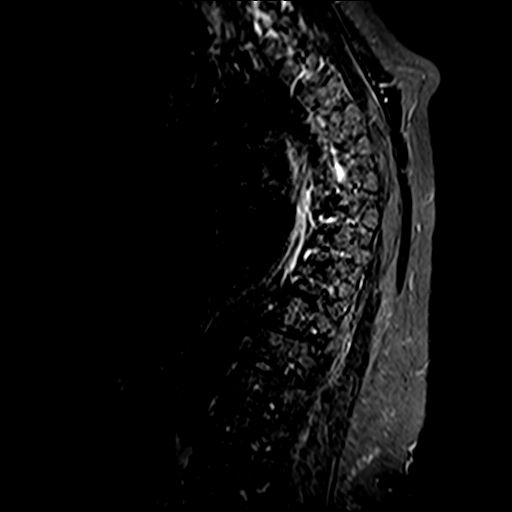
[im 7/17]
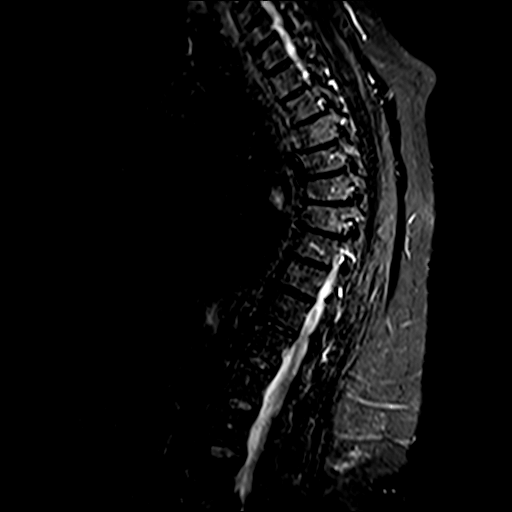
[im 10/17]
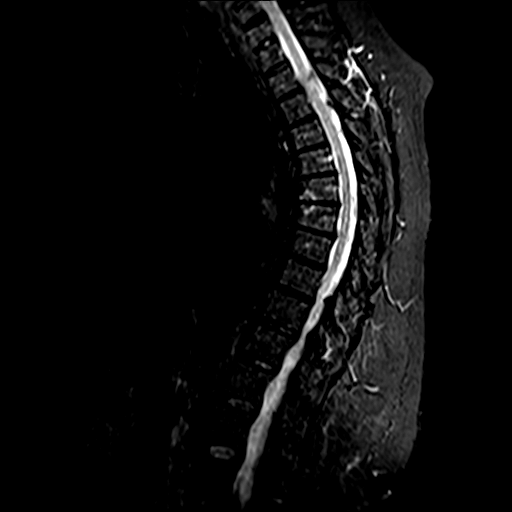
[im 13/17]
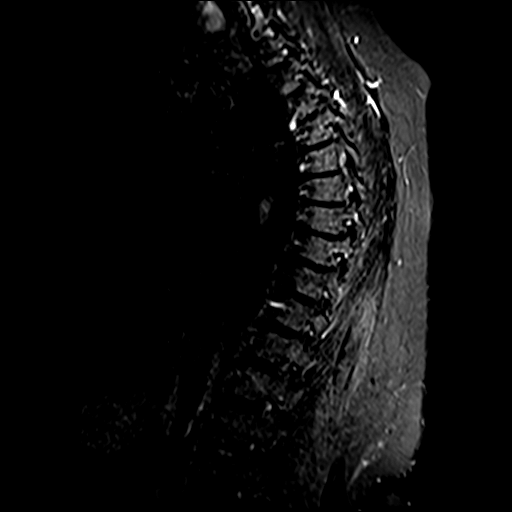
[im 17/17]
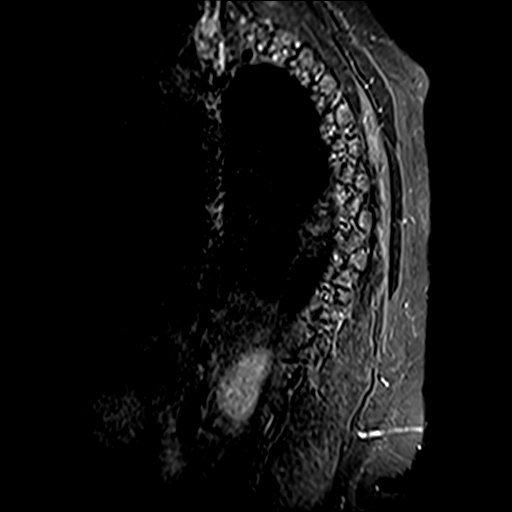

[Series 20: T2 · axial · 4.0mm · 0.59mm/px · z∈[-146,+37]mm · 8 of 39 slices shown (2 of 2)]
[im 1/39]
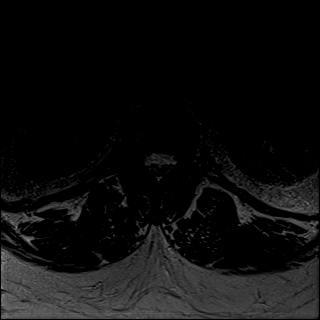
[im 6/39]
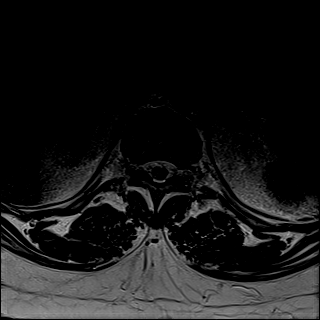
[im 12/39]
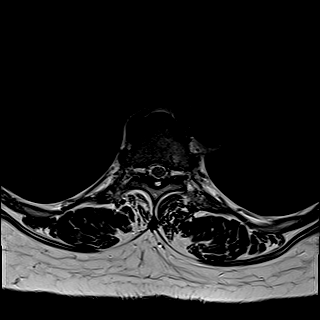
[im 18/39]
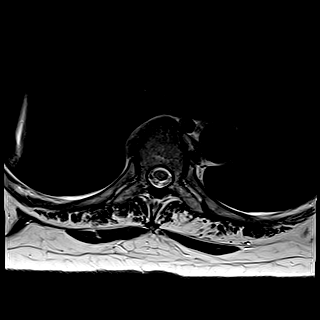
[im 21/39]
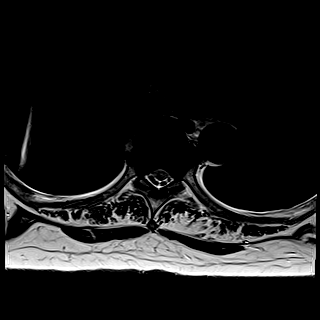
[im 27/39]
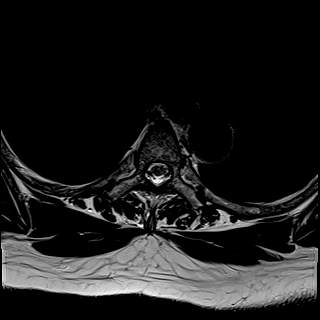
[im 33/39]
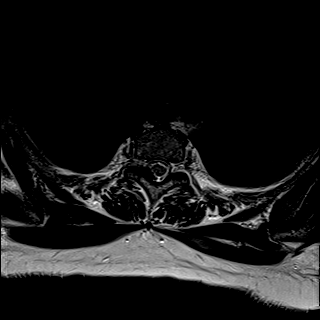
[im 39/39]
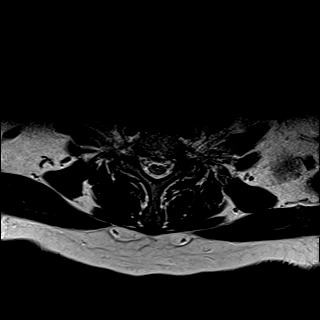

[Series 21: GRE · axial · 4.0mm · 0.37mm/px · z∈[-146,-26]mm · 5 of 39 slices shown]
[im 1/39]
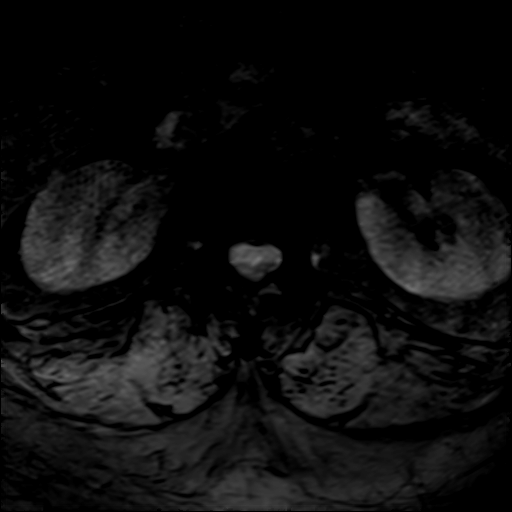
[im 6/39]
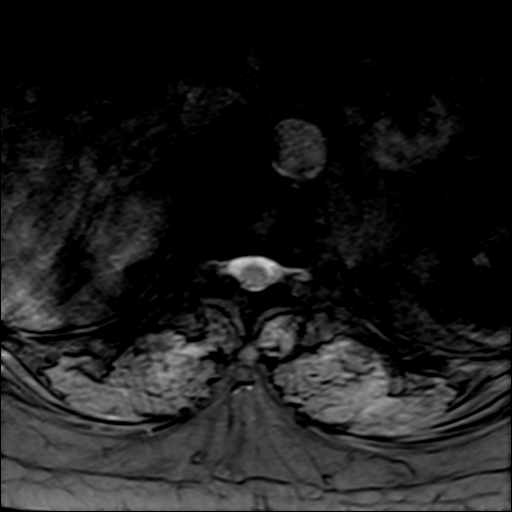
[im 12/39]
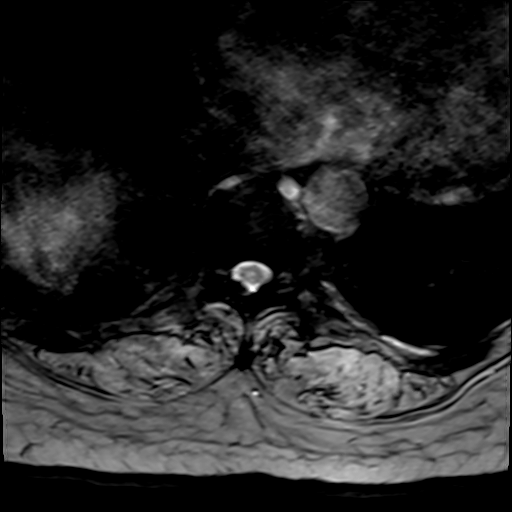
[im 18/39]
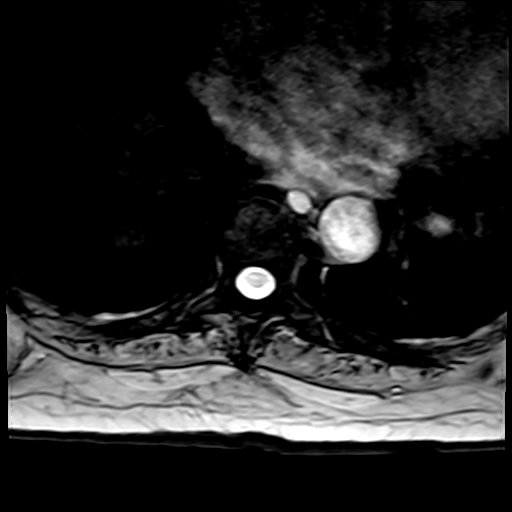
[im 21/39]
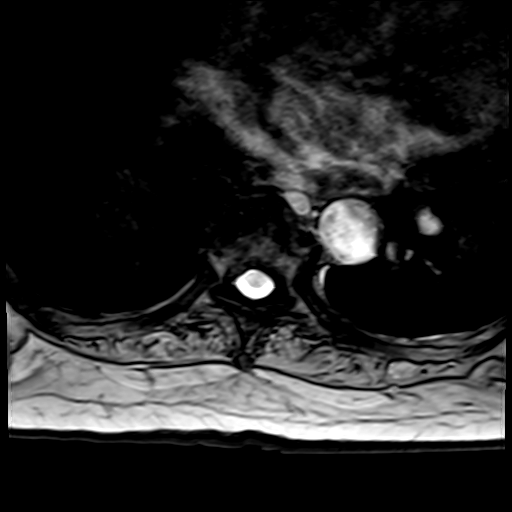

[33 of 48 positions shown; findings below may reference images not displayed]

FINDINGS: Alignment: Mildly exaggerated midthoracic kyphosis. No static
listhesis.

Vertebrae: No fracture, evidence of discitis, or bone lesion. Mild
discogenic endplate marrow changes at C6-7.

Cord:  Normal signal and morphology.

Paraspinal and other soft tissues: Focal areas of nodularity within
the visualized portion of the left lung (series 20, images 15-16)
poorly characterized.

Disc levels:

T1-T2: Negative.

T2-T3: Prominent right-sided facet hypertrophy mildly narrows the
canal and right neural foramen.

T3-T4: Prominent right greater than left facet hypertrophy mildly
narrows the canal and right neural foramen.

T4-T5: Right greater than left facet hypertrophy results in mild
right foraminal narrowing without significant canal stenosis. T5-T6:
Mild diffuse disc bulge and mild bilateral facet hypertrophy without
significant foraminal or canal stenosis.

T6-T7: Mild diffuse disc bulge without foraminal or canal stenosis.

T7-T8: Diffuse disc bulge, eccentric to the right results in mild
right foraminal stenosis without canal stenosis.

T8-T9: Mild diffuse disc bulge without foraminal or canal stenosis.

T9-T10: Mild diffuse disc bulge and mild bilateral facet hypertrophy
without canal or foraminal stenosis.

T10-T11: Mild diffuse disc bulge with bilateral facet hypertrophy
results in mild bilateral foraminal stenosis and mild canal
stenosis.

T11-T12: Bilateral facet hypertrophy without foraminal or canal
stenosis.
IMPRESSION: 1. Poorly characterized areas of nodular density within the
visualized left lung. Recommend dedicated CT of the chest for
further evaluation.
2. Multilevel thoracic spondylosis, most pronounced at the T10-T11
level where there is mild bilateral foraminal stenosis and mild
canal stenosis.

## 2020-04-13 DIAGNOSIS — H2513 Age-related nuclear cataract, bilateral: Secondary | ICD-10-CM | POA: Diagnosis not present

## 2020-04-23 DIAGNOSIS — I2510 Atherosclerotic heart disease of native coronary artery without angina pectoris: Secondary | ICD-10-CM | POA: Diagnosis not present

## 2020-04-23 DIAGNOSIS — R7309 Other abnormal glucose: Secondary | ICD-10-CM | POA: Diagnosis not present

## 2020-04-23 DIAGNOSIS — I10 Essential (primary) hypertension: Secondary | ICD-10-CM | POA: Diagnosis not present

## 2020-04-30 DIAGNOSIS — I5030 Unspecified diastolic (congestive) heart failure: Secondary | ICD-10-CM | POA: Diagnosis not present

## 2020-04-30 DIAGNOSIS — I10 Essential (primary) hypertension: Secondary | ICD-10-CM | POA: Diagnosis not present

## 2020-04-30 DIAGNOSIS — I2510 Atherosclerotic heart disease of native coronary artery without angina pectoris: Secondary | ICD-10-CM | POA: Diagnosis not present

## 2020-04-30 DIAGNOSIS — R7309 Other abnormal glucose: Secondary | ICD-10-CM | POA: Diagnosis not present

## 2020-04-30 DIAGNOSIS — Z23 Encounter for immunization: Secondary | ICD-10-CM | POA: Diagnosis not present

## 2020-06-15 DIAGNOSIS — Z20822 Contact with and (suspected) exposure to covid-19: Secondary | ICD-10-CM | POA: Diagnosis not present

## 2020-07-02 DIAGNOSIS — Z20822 Contact with and (suspected) exposure to covid-19: Secondary | ICD-10-CM | POA: Diagnosis not present

## 2020-07-24 DIAGNOSIS — E559 Vitamin D deficiency, unspecified: Secondary | ICD-10-CM | POA: Diagnosis not present

## 2020-07-24 DIAGNOSIS — R7309 Other abnormal glucose: Secondary | ICD-10-CM | POA: Diagnosis not present

## 2020-07-24 DIAGNOSIS — I10 Essential (primary) hypertension: Secondary | ICD-10-CM | POA: Diagnosis not present

## 2020-08-03 DIAGNOSIS — E7800 Pure hypercholesterolemia, unspecified: Secondary | ICD-10-CM | POA: Diagnosis not present

## 2020-08-03 DIAGNOSIS — I10 Essential (primary) hypertension: Secondary | ICD-10-CM | POA: Diagnosis not present

## 2020-08-03 DIAGNOSIS — E559 Vitamin D deficiency, unspecified: Secondary | ICD-10-CM | POA: Diagnosis not present

## 2020-08-03 DIAGNOSIS — R7309 Other abnormal glucose: Secondary | ICD-10-CM | POA: Diagnosis not present

## 2020-08-03 DIAGNOSIS — J449 Chronic obstructive pulmonary disease, unspecified: Secondary | ICD-10-CM | POA: Diagnosis not present

## 2020-08-03 DIAGNOSIS — I2510 Atherosclerotic heart disease of native coronary artery without angina pectoris: Secondary | ICD-10-CM | POA: Diagnosis not present

## 2020-08-31 DIAGNOSIS — I2584 Coronary atherosclerosis due to calcified coronary lesion: Secondary | ICD-10-CM | POA: Diagnosis not present

## 2020-08-31 DIAGNOSIS — I2510 Atherosclerotic heart disease of native coronary artery without angina pectoris: Secondary | ICD-10-CM | POA: Diagnosis not present

## 2020-08-31 DIAGNOSIS — I700 Atherosclerosis of aorta: Secondary | ICD-10-CM | POA: Diagnosis not present

## 2020-08-31 DIAGNOSIS — I10 Essential (primary) hypertension: Secondary | ICD-10-CM | POA: Diagnosis not present

## 2020-08-31 DIAGNOSIS — I5032 Chronic diastolic (congestive) heart failure: Secondary | ICD-10-CM | POA: Diagnosis not present

## 2020-08-31 DIAGNOSIS — E782 Mixed hyperlipidemia: Secondary | ICD-10-CM | POA: Diagnosis not present

## 2020-09-21 ENCOUNTER — Ambulatory Visit (LOCAL_COMMUNITY_HEALTH_CENTER): Payer: PPO

## 2020-09-21 ENCOUNTER — Other Ambulatory Visit: Payer: Self-pay

## 2020-09-21 DIAGNOSIS — Z111 Encounter for screening for respiratory tuberculosis: Secondary | ICD-10-CM

## 2020-09-24 ENCOUNTER — Other Ambulatory Visit: Payer: Self-pay

## 2020-09-24 ENCOUNTER — Ambulatory Visit (LOCAL_COMMUNITY_HEALTH_CENTER): Payer: PPO | Admitting: Nurse Practitioner

## 2020-09-24 DIAGNOSIS — Z111 Encounter for screening for respiratory tuberculosis: Secondary | ICD-10-CM

## 2020-09-24 LAB — TB SKIN TEST
Induration: 0 mm
TB Skin Test: NEGATIVE

## 2020-09-24 NOTE — Progress Notes (Signed)
70 year old female in clinic today for a PPDR. 0 mm, negative. Glenna Fellows, RN

## 2020-10-02 ENCOUNTER — Encounter: Payer: Self-pay | Admitting: Pulmonary Disease

## 2020-10-26 DIAGNOSIS — R7309 Other abnormal glucose: Secondary | ICD-10-CM | POA: Diagnosis not present

## 2020-10-26 DIAGNOSIS — I10 Essential (primary) hypertension: Secondary | ICD-10-CM | POA: Diagnosis not present

## 2020-10-26 DIAGNOSIS — E559 Vitamin D deficiency, unspecified: Secondary | ICD-10-CM | POA: Diagnosis not present

## 2020-10-26 DIAGNOSIS — E7800 Pure hypercholesterolemia, unspecified: Secondary | ICD-10-CM | POA: Diagnosis not present

## 2020-11-02 DIAGNOSIS — I10 Essential (primary) hypertension: Secondary | ICD-10-CM | POA: Diagnosis not present

## 2020-11-02 DIAGNOSIS — E7800 Pure hypercholesterolemia, unspecified: Secondary | ICD-10-CM | POA: Diagnosis not present

## 2020-11-02 DIAGNOSIS — E559 Vitamin D deficiency, unspecified: Secondary | ICD-10-CM | POA: Diagnosis not present

## 2020-11-02 DIAGNOSIS — R7309 Other abnormal glucose: Secondary | ICD-10-CM | POA: Diagnosis not present

## 2020-11-04 DIAGNOSIS — Z20822 Contact with and (suspected) exposure to covid-19: Secondary | ICD-10-CM | POA: Diagnosis not present

## 2020-11-11 DIAGNOSIS — Z20822 Contact with and (suspected) exposure to covid-19: Secondary | ICD-10-CM | POA: Diagnosis not present

## 2020-11-14 DIAGNOSIS — Z20822 Contact with and (suspected) exposure to covid-19: Secondary | ICD-10-CM | POA: Diagnosis not present

## 2021-01-09 ENCOUNTER — Other Ambulatory Visit: Payer: Self-pay | Admitting: Family Medicine

## 2021-01-09 DIAGNOSIS — Z1231 Encounter for screening mammogram for malignant neoplasm of breast: Secondary | ICD-10-CM

## 2021-01-14 DIAGNOSIS — R7309 Other abnormal glucose: Secondary | ICD-10-CM | POA: Diagnosis not present

## 2021-01-14 DIAGNOSIS — E7800 Pure hypercholesterolemia, unspecified: Secondary | ICD-10-CM | POA: Diagnosis not present

## 2021-01-14 DIAGNOSIS — I10 Essential (primary) hypertension: Secondary | ICD-10-CM | POA: Diagnosis not present

## 2021-01-14 DIAGNOSIS — E559 Vitamin D deficiency, unspecified: Secondary | ICD-10-CM | POA: Diagnosis not present

## 2021-01-30 DIAGNOSIS — Z0000 Encounter for general adult medical examination without abnormal findings: Secondary | ICD-10-CM | POA: Diagnosis not present

## 2021-01-30 DIAGNOSIS — E559 Vitamin D deficiency, unspecified: Secondary | ICD-10-CM | POA: Diagnosis not present

## 2021-01-30 DIAGNOSIS — H25811 Combined forms of age-related cataract, right eye: Secondary | ICD-10-CM | POA: Diagnosis not present

## 2021-01-30 DIAGNOSIS — R7309 Other abnormal glucose: Secondary | ICD-10-CM | POA: Diagnosis not present

## 2021-01-30 DIAGNOSIS — R200 Anesthesia of skin: Secondary | ICD-10-CM | POA: Diagnosis not present

## 2021-01-30 DIAGNOSIS — M25551 Pain in right hip: Secondary | ICD-10-CM | POA: Diagnosis not present

## 2021-01-30 DIAGNOSIS — Z01818 Encounter for other preprocedural examination: Secondary | ICD-10-CM | POA: Diagnosis not present

## 2021-01-30 DIAGNOSIS — H25812 Combined forms of age-related cataract, left eye: Secondary | ICD-10-CM | POA: Diagnosis not present

## 2021-01-30 DIAGNOSIS — Z23 Encounter for immunization: Secondary | ICD-10-CM | POA: Diagnosis not present

## 2021-01-30 DIAGNOSIS — I10 Essential (primary) hypertension: Secondary | ICD-10-CM | POA: Diagnosis not present

## 2021-01-30 DIAGNOSIS — M549 Dorsalgia, unspecified: Secondary | ICD-10-CM | POA: Diagnosis not present

## 2021-01-30 DIAGNOSIS — E7800 Pure hypercholesterolemia, unspecified: Secondary | ICD-10-CM | POA: Diagnosis not present

## 2021-02-06 ENCOUNTER — Ambulatory Visit
Admission: RE | Admit: 2021-02-06 | Discharge: 2021-02-06 | Disposition: A | Discharge status: Home / Self Care | Payer: PPO | Source: Ambulatory Visit | Attending: Family Medicine | Admitting: Family Medicine

## 2021-02-06 ENCOUNTER — Other Ambulatory Visit: Payer: Self-pay

## 2021-02-06 DIAGNOSIS — Z1231 Encounter for screening mammogram for malignant neoplasm of breast: Secondary | ICD-10-CM | POA: Diagnosis not present

## 2021-02-11 DIAGNOSIS — H2511 Age-related nuclear cataract, right eye: Secondary | ICD-10-CM | POA: Diagnosis not present

## 2021-03-04 DIAGNOSIS — H2512 Age-related nuclear cataract, left eye: Secondary | ICD-10-CM | POA: Diagnosis not present

## 2021-03-04 DIAGNOSIS — H25812 Combined forms of age-related cataract, left eye: Secondary | ICD-10-CM | POA: Diagnosis not present

## 2021-03-07 DIAGNOSIS — I5032 Chronic diastolic (congestive) heart failure: Secondary | ICD-10-CM | POA: Diagnosis not present

## 2021-03-07 DIAGNOSIS — E119 Type 2 diabetes mellitus without complications: Secondary | ICD-10-CM | POA: Diagnosis not present

## 2021-03-07 DIAGNOSIS — I10 Essential (primary) hypertension: Secondary | ICD-10-CM | POA: Diagnosis not present

## 2021-03-07 DIAGNOSIS — I2510 Atherosclerotic heart disease of native coronary artery without angina pectoris: Secondary | ICD-10-CM | POA: Diagnosis not present

## 2021-03-07 DIAGNOSIS — I872 Venous insufficiency (chronic) (peripheral): Secondary | ICD-10-CM | POA: Diagnosis not present

## 2021-03-07 DIAGNOSIS — I700 Atherosclerosis of aorta: Secondary | ICD-10-CM | POA: Diagnosis not present

## 2021-03-07 DIAGNOSIS — I2584 Coronary atherosclerosis due to calcified coronary lesion: Secondary | ICD-10-CM | POA: Diagnosis not present

## 2021-03-07 DIAGNOSIS — E782 Mixed hyperlipidemia: Secondary | ICD-10-CM | POA: Diagnosis not present

## 2021-04-08 ENCOUNTER — Emergency Department: Payer: PPO

## 2021-04-08 ENCOUNTER — Inpatient Hospital Stay
Admission: EM | Admit: 2021-04-08 | Discharge: 2021-04-11 | DRG: 291 | Disposition: A | Discharge status: Home / Self Care | Payer: PPO | Attending: Internal Medicine | Admitting: Internal Medicine

## 2021-04-08 ENCOUNTER — Other Ambulatory Visit: Payer: Self-pay

## 2021-04-08 DIAGNOSIS — I509 Heart failure, unspecified: Secondary | ICD-10-CM

## 2021-04-08 DIAGNOSIS — E119 Type 2 diabetes mellitus without complications: Secondary | ICD-10-CM | POA: Diagnosis not present

## 2021-04-08 DIAGNOSIS — Z90710 Acquired absence of both cervix and uterus: Secondary | ICD-10-CM | POA: Diagnosis not present

## 2021-04-08 DIAGNOSIS — E785 Hyperlipidemia, unspecified: Secondary | ICD-10-CM | POA: Diagnosis not present

## 2021-04-08 DIAGNOSIS — R011 Cardiac murmur, unspecified: Secondary | ICD-10-CM | POA: Diagnosis not present

## 2021-04-08 DIAGNOSIS — I248 Other forms of acute ischemic heart disease: Secondary | ICD-10-CM | POA: Diagnosis present

## 2021-04-08 DIAGNOSIS — J9601 Acute respiratory failure with hypoxia: Secondary | ICD-10-CM | POA: Diagnosis present

## 2021-04-08 DIAGNOSIS — I10 Essential (primary) hypertension: Secondary | ICD-10-CM | POA: Diagnosis not present

## 2021-04-08 DIAGNOSIS — Z6841 Body Mass Index (BMI) 40.0 and over, adult: Secondary | ICD-10-CM

## 2021-04-08 DIAGNOSIS — I5043 Acute on chronic combined systolic (congestive) and diastolic (congestive) heart failure: Secondary | ICD-10-CM

## 2021-04-08 DIAGNOSIS — I110 Hypertensive heart disease with heart failure: Principal | ICD-10-CM | POA: Diagnosis present

## 2021-04-08 DIAGNOSIS — R079 Chest pain, unspecified: Secondary | ICD-10-CM | POA: Diagnosis present

## 2021-04-08 DIAGNOSIS — E876 Hypokalemia: Secondary | ICD-10-CM | POA: Diagnosis not present

## 2021-04-08 DIAGNOSIS — M25511 Pain in right shoulder: Secondary | ICD-10-CM | POA: Diagnosis not present

## 2021-04-08 DIAGNOSIS — Z20822 Contact with and (suspected) exposure to covid-19: Secondary | ICD-10-CM | POA: Diagnosis present

## 2021-04-08 DIAGNOSIS — Z87891 Personal history of nicotine dependence: Secondary | ICD-10-CM | POA: Diagnosis not present

## 2021-04-08 DIAGNOSIS — R0602 Shortness of breath: Secondary | ICD-10-CM | POA: Diagnosis present

## 2021-04-08 DIAGNOSIS — Z8249 Family history of ischemic heart disease and other diseases of the circulatory system: Secondary | ICD-10-CM

## 2021-04-08 DIAGNOSIS — Z7982 Long term (current) use of aspirin: Secondary | ICD-10-CM | POA: Diagnosis not present

## 2021-04-08 DIAGNOSIS — J449 Chronic obstructive pulmonary disease, unspecified: Secondary | ICD-10-CM | POA: Diagnosis present

## 2021-04-08 DIAGNOSIS — M19011 Primary osteoarthritis, right shoulder: Secondary | ICD-10-CM | POA: Diagnosis not present

## 2021-04-08 DIAGNOSIS — I5033 Acute on chronic diastolic (congestive) heart failure: Secondary | ICD-10-CM | POA: Diagnosis not present

## 2021-04-08 DIAGNOSIS — I2510 Atherosclerotic heart disease of native coronary artery without angina pectoris: Secondary | ICD-10-CM | POA: Diagnosis not present

## 2021-04-08 DIAGNOSIS — R7301 Impaired fasting glucose: Secondary | ICD-10-CM

## 2021-04-08 DIAGNOSIS — Z79899 Other long term (current) drug therapy: Secondary | ICD-10-CM | POA: Diagnosis not present

## 2021-04-08 DIAGNOSIS — I5031 Acute diastolic (congestive) heart failure: Secondary | ICD-10-CM | POA: Diagnosis not present

## 2021-04-08 DIAGNOSIS — Z7984 Long term (current) use of oral hypoglycemic drugs: Secondary | ICD-10-CM | POA: Diagnosis not present

## 2021-04-08 DIAGNOSIS — I6529 Occlusion and stenosis of unspecified carotid artery: Secondary | ICD-10-CM | POA: Diagnosis present

## 2021-04-08 DIAGNOSIS — R52 Pain, unspecified: Secondary | ICD-10-CM

## 2021-04-08 LAB — BASIC METABOLIC PANEL
Anion gap: 8 (ref 5–15)
BUN: 14 mg/dL (ref 8–23)
CO2: 27 mmol/L (ref 22–32)
Calcium: 9.7 mg/dL (ref 8.9–10.3)
Chloride: 104 mmol/L (ref 98–111)
Creatinine, Ser: 0.9 mg/dL (ref 0.44–1.00)
GFR, Estimated: >60 mL/min (ref >60)
Glucose, Bld: 98 mg/dL (ref 70–99)
Potassium: 3.8 mmol/L (ref 3.5–5.1)
Sodium: 139 mmol/L (ref 135–145)

## 2021-04-08 LAB — PROCALCITONIN: Procalcitonin: <0.1 ng/mL

## 2021-04-08 LAB — CBC
HCT: 42.4 % (ref 36.0–46.0)
Hemoglobin: 14.1 g/dL (ref 12.0–15.0)
MCH: 27.1 pg (ref 26.0–34.0)
MCHC: 33.3 g/dL (ref 30.0–36.0)
MCV: 81.5 fL (ref 80.0–100.0)
Platelets: 215 10*3/uL (ref 150–400)
RBC: 5.2 MIL/uL — ABNORMAL HIGH (ref 3.87–5.11)
RDW: 14.6 % (ref 11.5–15.5)
WBC: 6.4 10*3/uL (ref 4.0–10.5)
nRBC: 0 % (ref 0.0–0.2)

## 2021-04-08 LAB — RESP PANEL BY RT-PCR (FLU A&B, COVID) ARPGX2
Influenza A by PCR: NEGATIVE
Influenza B by PCR: NEGATIVE
SARS Coronavirus 2 by RT PCR: NEGATIVE

## 2021-04-08 LAB — BRAIN NATRIURETIC PEPTIDE: B Natriuretic Peptide: 223.9 pg/mL — ABNORMAL HIGH (ref 0.0–100.0)

## 2021-04-08 LAB — TROPONIN I (HIGH SENSITIVITY)
Troponin I (High Sensitivity): 4 ng/L (ref <18)
Troponin I (High Sensitivity): 5 ng/L (ref <18)

## 2021-04-08 MED ORDER — CARVEDILOL 12.5 MG PO TABS
12.5000 mg | ORAL_TABLET | Freq: Two times a day (BID) | ORAL | Status: DC
  Administered 2021-04-10 – 2021-04-11 (×3): 12.5 mg via ORAL
  Filled 2021-04-08 (×3): qty 1

## 2021-04-08 MED ORDER — ADULT MULTIVITAMIN W/MINERALS CH
1.0000 | ORAL_TABLET | Freq: Every day | ORAL | Status: DC
  Administered 2021-04-10 – 2021-04-11 (×2): 1 via ORAL
  Filled 2021-04-08 (×3): qty 1

## 2021-04-08 MED ORDER — ALBUTEROL SULFATE (2.5 MG/3ML) 0.083% IN NEBU: 2.5000 mg | INHALATION_SOLUTION | RESPIRATORY_TRACT | Status: DC | PRN

## 2021-04-08 MED ORDER — ASPIRIN EC 81 MG PO TBEC
81.0000 mg | DELAYED_RELEASE_TABLET | Freq: Every day | ORAL | Status: DC
  Administered 2021-04-08 – 2021-04-10 (×3): 81 mg via ORAL
  Filled 2021-04-08 (×3): qty 1

## 2021-04-08 MED ORDER — HYDRALAZINE HCL 20 MG/ML IJ SOLN: 5.0000 mg | INTRAMUSCULAR | Status: DC | PRN

## 2021-04-08 MED ORDER — ONDANSETRON HCL 4 MG/2ML IJ SOLN
4.0000 mg | Freq: Three times a day (TID) | INTRAMUSCULAR | Status: DC | PRN
  Administered 2021-04-10: 4 mg via INTRAVENOUS
  Filled 2021-04-08: qty 2

## 2021-04-08 MED ORDER — DM-GUAIFENESIN ER 30-600 MG PO TB12
1.0000 | ORAL_TABLET | Freq: Two times a day (BID) | ORAL | Status: DC | PRN
Start: 2021-04-08 — End: 2021-04-11

## 2021-04-08 MED ORDER — ENOXAPARIN SODIUM 40 MG/0.4ML IJ SOSY: 40.0000 mg | PREFILLED_SYRINGE | INTRAMUSCULAR | Status: DC

## 2021-04-08 MED ORDER — IRBESARTAN 150 MG PO TABS
75.0000 mg | ORAL_TABLET | Freq: Every day | ORAL | Status: DC
  Administered 2021-04-10 – 2021-04-11 (×2): 75 mg via ORAL
  Filled 2021-04-08 (×4): qty 1

## 2021-04-08 MED ORDER — SIMVASTATIN 20 MG PO TABS
40.0000 mg | ORAL_TABLET | Freq: Every day | ORAL | Status: DC
  Administered 2021-04-09 – 2021-04-10 (×2): 40 mg via ORAL
  Filled 2021-04-08: qty 2
  Filled 2021-04-08: qty 4

## 2021-04-08 MED ORDER — FUROSEMIDE 10 MG/ML IJ SOLN
40.0000 mg | Freq: Two times a day (BID) | INTRAMUSCULAR | Status: DC
  Administered 2021-04-08 – 2021-04-10 (×2): 40 mg via INTRAVENOUS
  Filled 2021-04-08 (×2): qty 4

## 2021-04-08 MED ORDER — DIPHENHYDRAMINE-APAP (SLEEP) 25-500 MG PO TABS: 1.0000 | ORAL_TABLET | Freq: Every evening | ORAL | Status: DC | PRN

## 2021-04-08 MED ORDER — ISOSORBIDE MONONITRATE ER 30 MG PO TB24
30.0000 mg | ORAL_TABLET | Freq: Every day | ORAL | Status: DC
  Administered 2021-04-10 – 2021-04-11 (×2): 30 mg via ORAL
  Filled 2021-04-08 (×3): qty 1

## 2021-04-08 MED ORDER — FUROSEMIDE 10 MG/ML IJ SOLN
40.0000 mg | Freq: Once | INTRAMUSCULAR | Status: AC
  Administered 2021-04-08: 40 mg via INTRAVENOUS
  Filled 2021-04-08: qty 4

## 2021-04-08 MED ORDER — TIOTROPIUM BROMIDE MONOHYDRATE 18 MCG IN CAPS
18.0000 ug | ORAL_CAPSULE | Freq: Every day | RESPIRATORY_TRACT | Status: DC
  Administered 2021-04-09 – 2021-04-11 (×3): 18 ug via RESPIRATORY_TRACT
  Filled 2021-04-08 (×2): qty 5

## 2021-04-08 MED ORDER — ACETAMINOPHEN 500 MG PO TABS
1000.0000 mg | ORAL_TABLET | Freq: Once | ORAL | Status: AC
  Administered 2021-04-08: 1000 mg via ORAL
  Filled 2021-04-08: qty 2

## 2021-04-08 MED ORDER — ACETAMINOPHEN 500 MG PO TABS: 500.0000 mg | ORAL_TABLET | Freq: Every evening | ORAL | Status: DC | PRN

## 2021-04-08 MED ORDER — ASCORBIC ACID 500 MG PO TABS
250.0000 mg | ORAL_TABLET | Freq: Every day | ORAL | Status: DC
  Administered 2021-04-10 – 2021-04-11 (×2): 250 mg via ORAL
  Filled 2021-04-08 (×3): qty 1

## 2021-04-08 MED ORDER — ACETAMINOPHEN 325 MG PO TABS
650.0000 mg | ORAL_TABLET | Freq: Four times a day (QID) | ORAL | Status: DC | PRN
Start: 2021-04-08 — End: 2021-04-11
  Administered 2021-04-09 – 2021-04-10 (×2): 650 mg via ORAL
  Filled 2021-04-08 (×2): qty 2

## 2021-04-08 MED ORDER — DIPHENHYDRAMINE HCL 25 MG PO CAPS: 25.0000 mg | ORAL_CAPSULE | Freq: Every evening | ORAL | Status: DC | PRN

## 2021-04-08 MED ORDER — GLUCOSAMINE-CHONDROITIN 250-200 MG PO TABS: 1.0000 | ORAL_TABLET | Freq: Every day | ORAL | Status: DC

## 2021-04-08 MED ORDER — ENOXAPARIN SODIUM 60 MG/0.6ML IJ SOSY
0.5000 mg/kg | PREFILLED_SYRINGE | INTRAMUSCULAR | Status: DC
  Administered 2021-04-08 – 2021-04-10 (×3): 55 mg via SUBCUTANEOUS
  Filled 2021-04-08: qty 0.55
  Filled 2021-04-08: qty 0.6
  Filled 2021-04-08: qty 0.55
  Filled 2021-04-08: qty 0.6

## 2021-04-08 NOTE — ED Triage Notes (Signed)
Pt reports intermittent chest pain for the past 2 weeks, states that she has chf and this reminds her of that, states that she took an extra lasix this am and states that she has been coughing up some pink frothy material. Pt has a cough in triage and sat of 92 % on room air

## 2021-04-08 NOTE — ED Provider Notes (Signed)
[facility] Emergency Department Provider Note  ____________________________________________   Event Date/Time   First MD Initiated Contact with Patient 04/08/21 1245     (approximate)  I have reviewed the triage vital signs and the nursing notes.   HISTORY  Chief Complaint Shortness of Breath, Chest Pain, and Cough   HPI Sarah [name] is a 70 y.o. female with past medical history of atherosclerosis, CHF, COPD, DM and HTN who presents for assessment approximately 2 weeks of some substernal chest pain associate with cough and shortness of breath.  She denies any fevers, headache, earache, sore throat, nausea, vomiting, diarrhea, abdominal pain, back pain, rash or extremity pain.  No recent falls or injuries.  She does think she has been gaining some weight and is worried about extra fluid on her heart and took 2 doses of her Lasix today she normally only takes 1.  She does not orthopnea.  No other acute concerns at this time.         Past Medical History:  Diagnosis Date   Carotid artery disease (HCC)    Congestive heart failure (HCC)    COPD (chronic obstructive pulmonary disease) (HCC)    Diabetes mellitus without complication (HCC)    pt denies, however is on metformin daily   HBP (high blood pressure)    Heart murmur     Patient Active Problem List   Diagnosis Date Noted   Acute on chronic diastolic CHF (congestive heart failure) (HCC) 04/08/2021   Osteoarthritis of knee 07/09/2017   Patellofemoral stress syndrome 07/09/2017   Venous insufficiency of both lower extremities 02/11/2017   Chronic diastolic CHF (congestive heart failure), NYHA class 2 (HCC) 05/12/2016   Controlled type 2 diabetes mellitus without complication, without long-term current use of insulin (HCC) 05/12/2016   Benign essential HTN 04/04/2016   Mixed hyperlipidemia 04/04/2016   Other diseases of stomach and duodenum    Problems with swallowing and mastication    Left  buttock abscess 12/15/2014   DOE (dyspnea on exertion) 06/05/2014   COPD type A (HCC) 06/05/2014   Obesity 06/05/2014    Past Surgical History:  Procedure Laterality Date   ABDOMINAL HYSTERECTOMY  1991   CARDIAC CATHETERIZATION  1999   COLONOSCOPY     ESOPHAGEAL DILATION N/A 02/11/2016   Procedure: ESOPHAGEAL DILATION;  Surgeon: Midge Minium, MD;  Location: [facility];  Service: Endoscopy;  Laterality: N/A;   ESOPHAGOGASTRODUODENOSCOPY (EGD) WITH PROPOFOL N/A 02/11/2016   Procedure: ESOPHAGOGASTRODUODENOSCOPY (EGD) WITH PROPOFOL;  Surgeon: Midge Minium, MD;  Location: [facility];  Service: Endoscopy;  Laterality: N/A;  DIABETIC    Prior to Admission medications   Medication Sig Start Date End Date Taking? Authorizing Provider  acetaminophen (TYLENOL) 500 MG tablet Take 500 mg by mouth as needed.    [provider]  Ascorbic Acid (VITAMIN C) 100 MG tablet Take 100 mg by mouth daily.    [provider]  aspirin 81 MG tablet Take 81 mg by mouth daily.    [provider]  carvedilol (COREG) 12.5 MG tablet Take 12.5 mg by mouth 2 (two) times daily with a meal. Reported on 10/08/2015    [provider]  furosemide (LASIX) 20 MG tablet Take 20 mg by mouth daily.    [provider]  gentamicin cream (GARAMYCIN) 0.1 % APPLY A COPIOUS AMOUNT TWICE A DAY TO AFFECTED AREA EXTERNALLY 7 DAYS 04/27/19   [provider]  Glucosamine-Chondroitin (OSTEO BI-FLEX REGULAR STRENGTH PO) Take 1  tablet by mouth daily.    [provider]  isosorbide mononitrate (IMDUR) 30 MG 24 hr tablet Take 30 mg by mouth daily. 01/13/18   [provider]  metFORMIN (GLUCOPHAGE) 500 MG tablet Take 500 mg by mouth 2 (two) times daily with a meal.  11/17/14   [provider]  PROAIR HFA 108 (90 Base) MCG/ACT inhaler INHALE TWO PUFFS BY MOUTH EVERY 2 TO 4 HOURS AS NEEDED 08/31/15   Mungal, Vishal, MD  simvastatin (ZOCOR) 40 MG tablet Take 40  mg by mouth daily at 6 PM.     [provider]  SPIRIVA HANDIHALER 18 MCG inhalation capsule Place 18 mcg into inhaler and inhale daily.  09/09/19   [provider]  valsartan (DIOVAN) 80 MG tablet Take 80 mg by mouth daily. 09/09/19   [provider]    Allergies Patient has no known allergies.  Family History  Problem Relation Age of Onset   Stroke Father    Hypertension Father    Hypertension Mother    Stroke Mother    Breast cancer Cousin        2 mat cousins   Breast cancer Cousin        pat cousin    Social History Social History   Tobacco Use   Smoking status: Former    Packs/day: 0.50    Years: 27.00    Pack years: 13.50    Types: Cigarettes    Quit date: 01/15/1996    Years since quitting: 25.2   Smokeless tobacco: Never  Vaping Use   Vaping Use: Never used  Substance Use Topics   Alcohol use: Yes    Alcohol/week: 0.0 standard drinks    Comment: WINE WEEKLY   Drug use: No    Review of Systems  Review of Systems  Constitutional:  Negative for chills and fever.  HENT:  Negative for sore throat.   Eyes:  Negative for pain.  Respiratory:  Positive for cough and shortness of breath. Negative for stridor.   Cardiovascular:  Positive for chest pain and orthopnea.  Gastrointestinal:  Negative for vomiting.  Genitourinary:  Negative for dysuria.  Musculoskeletal:  Negative for myalgias.  Skin:  Negative for rash.  Neurological:  Negative for seizures, loss of consciousness and headaches.  Psychiatric/Behavioral:  Negative for suicidal ideas.   All other systems reviewed and are negative.    ____________________________________________   PHYSICAL EXAM:  VITAL SIGNS: ED Triage Vitals  Enc Vitals Group     BP 04/08/21 1133 (!) 140/92     Pulse Rate 04/08/21 1133 78     Resp 04/08/21 1133 20     Temp 04/08/21 1133 98.2 F (36.8 C)     Temp Source 04/08/21 1133 Oral     SpO2 04/08/21 1133 92 %     Weight 04/08/21 1134 240 lb  (108.9 kg)     Height 04/08/21 1134 5\' 4"  (1.626 m)     Head Circumference --      Peak Flow --      Pain Score 04/08/21 1133 3     Pain Loc --      Pain Edu? --      Excl. in GC? --    Vitals:   04/08/21 1133  BP: (!) 140/92  Pulse: 78  Resp: 20  Temp: 98.2 F (36.8 C)  SpO2: 92%   Physical Exam Vitals and nursing note reviewed.  Constitutional:      General: She is  not in acute distress.    Appearance: She is well-developed. She is ill-appearing.  HENT:     Head: Normocephalic and atraumatic.     Right Ear: External ear normal.     Left Ear: External ear normal.     Nose: Nose normal.  Eyes:     Conjunctiva/sclera: Conjunctivae normal.  Cardiovascular:     Rate and Rhythm: Normal rate and regular rhythm.     Heart sounds: No murmur heard. Pulmonary:     Effort: Tachypnea present. No respiratory distress.     Breath sounds: Examination of the right-lower field reveals rales. Examination of the left-lower field reveals rales. Decreased breath sounds and rales present.  Abdominal:     Palpations: Abdomen is soft.     Tenderness: There is no abdominal tenderness.  Musculoskeletal:     Cervical back: Neck supple.     Right lower leg: Edema present.     Left lower leg: Edema present.  Skin:    General: Skin is warm and dry.  Neurological:     Mental Status: She is alert and oriented to person, place, and time.  Psychiatric:        Mood and Affect: Mood normal.     ____________________________________________   LABS (all labs ordered are listed, but only abnormal results are displayed)  Labs Reviewed  CBC - Abnormal; Notable for the following components:      Result Value   RBC 5.20 (*)    All other components within normal limits  BRAIN NATRIURETIC PEPTIDE - Abnormal; Notable for the following components:   B Natriuretic Peptide 223.9 (*)    All other components within normal limits  RESP PANEL BY RT-PCR (FLU A&B, COVID) ARPGX2  BASIC METABOLIC PANEL   PROCALCITONIN  HEMOGLOBIN A1C  TROPONIN I (HIGH SENSITIVITY)  TROPONIN I (HIGH SENSITIVITY)   ____________________________________________  EKG  ECG shows sinus rhythm with a ventricular rate of 75, normal axis, unremarkable intervals without clear evidence of acute ischemia or significant arrhythmia. ____________________________________________  RADIOLOGY  ED MD interpretation: Chest x-ray remarkable for some multifocal opacities concerning for overt edema versus multifocal pneumonia.  There is no large effusion, pneumothorax or other acute thoracic process.  Official radiology report(s): No results found.  ____________________________________________   PROCEDURES  Procedure(s) performed (including Critical Care):  .Critical Care Performed by: Gilles Chiquito, MD Authorized by: Gilles Chiquito, MD   Critical care provider statement:    Critical care time (minutes):  45   Critical care was necessary to treat or prevent imminent or life-threatening deterioration of the following conditions:  Respiratory failure   Critical care was time spent personally by me on the following activities:  Development of treatment plan with patient or surrogate, ordering and review of laboratory studies, ordering and review of radiographic studies, pulse oximetry, re-evaluation of patient's condition, review of old charts, ordering and performing treatments and interventions, examination of patient, evaluation of patient's response to treatment and obtaining history from patient or surrogate   ____________________________________________   INITIAL IMPRESSION / ASSESSMENT AND PLAN / ED COURSE      Patient presents with above-stated history exam for assessment of cough and shortness of breath associate with some pink-tinged sputum.  She does have history of CHF and has been increasing her Lasix today but has also been gaining weight and has orthopnea.  On arrival she was afebrile and  hemodynamically stable with borderline SPO2 of 92%.  However she was noted to intermittently dipped down  to 90% which improved to 93 or greater on 1 L since cannula.  Differential includes PE, pneumonia, CHF exacerbation, bronchitis metabolic derangements and anemia and arrhythmia.  CBC shows no leukocytosis or acute anemia.  Chest x-ray remarkable for some multifocal opacities concerning for overt edema versus multifocal pneumonia.  There is no large effusion, pneumothorax or other acute thoracic process.   Given absence of fever or leukocytosis I have a lower suspicion for bacterial etiology at this time.  ECG and nonelevated troponin are not suggestive of ACS or arrhythmia.  COVID influenza PCR is negative.  BNP is elevated from baseline at 162 2 years ago at 223 today.  Impression is acute CHF exacerbation with a lower suspicion for PE.  We will give a dose of IV Lasix and admit to hospital service for further evaluation and management.     ____________________________________________   FINAL CLINICAL IMPRESSION(S) / ED DIAGNOSES  Final diagnoses:  Congestive heart failure, unspecified HF chronicity, unspecified heart failure type (HCC)  Acute respiratory failure with hypoxia (HCC)    Medications  albuterol (PROVENTIL) (2.5 MG/3ML) 0.083% nebulizer solution 2.5 mg (has no administration in time range)  dextromethorphan-guaiFENesin (MUCINEX DM) 30-600 MG per 12 hr tablet 1 tablet (has no administration in time range)  ondansetron (ZOFRAN) injection 4 mg (has no administration in time range)  acetaminophen (TYLENOL) tablet 650 mg (has no administration in time range)  hydrALAZINE (APRESOLINE) injection 5 mg (has no administration in time range)  furosemide (LASIX) injection 40 mg (has no administration in time range)  acetaminophen (TYLENOL) tablet 1,000 mg (1,000 mg Oral Given 04/08/21 1319)  furosemide (LASIX) injection 40 mg (40 mg Intravenous Given 04/08/21 1328)     ED  Discharge Orders     None        Note:  This document was prepared using Dragon voice recognition software and may include unintentional dictation errors.    Gilles Chiquito, MD 04/08/21 1434

## 2021-04-08 NOTE — H&P (Signed)
Acute  History and Physical    Sarah [name] QTM:[id number] DOB: [birth year] DOA: 04/08/2021  Referring MD/NP/PA:   PCP: Irena Reichmann, DO   Patient coming from:  The patient is coming from home.  At baseline, pt is independent for most of ADL.        Chief Complaint: SOB  HPI: Sarah [name] is a 70 y.o. female with medical history significant of dCHF, hypertension, hyperlipidemia, pre-diabetes, COPD, CAD, obesity with BMI 41, carotid artery stenosis, who presents with shortness of breath.  Patient states that she has been having shortness of breath for about 2 weeks.  She has cough with pink frothy production.  No fever or chills.  She has several pounds of weight gain recently.  Patient states that she had some chest discomfort and mild chest pain intermittently.  Currently no active chest pain.  Patient has nausea, vomiting, diarrhea or abdominal pain.  No symptoms of UTI.  Patient stated she is taking metformin for prediabetes.  ED Course: pt was found to have BNP 223, troponin level 4, negative COVID PCR, procalcitonin <0.010, electrolytes renal function okay, temperature normal, blood pressure 140/92, heart rate 78, RR 20, oxygen saturation 92% on room air patient was placed on 2 L oxygen.  Chest x-ray showed diffused multifocal opacity.  Patient is admitted to progressive bed as inpatient  Review of Systems:   General: no fevers, chills, has body weight gain, has fatigue HEENT: no blurry vision, hearing changes or sore throat Respiratory: has dyspnea, coughing, no wheezing CV: has chest pain, no palpitations GI: no nausea, vomiting, abdominal pain, diarrhea, constipation GU: no dysuria, burning on urination, increased urinary frequency, hematuria  Ext: has leg edema Neuro: no unilateral weakness, numbness, or tingling, no vision change or hearing loss Skin: no rash, no skin tear. MSK: No muscle spasm, no deformity, no limitation of range of movement in spin Heme: No easy  bruising.  Travel history: No recent long distant travel.  Allergy: No Known Allergies  Past Medical History:  Diagnosis Date   Carotid artery disease (HCC)    Congestive heart failure (HCC)    COPD (chronic obstructive pulmonary disease) (HCC)    Diabetes mellitus without complication (HCC)    pt denies, however is on metformin daily   HBP (high blood pressure)    Heart murmur     Past Surgical History:  Procedure Laterality Date   ABDOMINAL HYSTERECTOMY  1991   CARDIAC CATHETERIZATION  1999   COLONOSCOPY     ESOPHAGEAL DILATION N/A 02/11/2016   Procedure: ESOPHAGEAL DILATION;  Surgeon: Midge Minium, MD;  Location: [facility];  Service: Endoscopy;  Laterality: N/A;   ESOPHAGOGASTRODUODENOSCOPY (EGD) WITH PROPOFOL N/A 02/11/2016   Procedure: ESOPHAGOGASTRODUODENOSCOPY (EGD) WITH PROPOFOL;  Surgeon: Midge Minium, MD;  Location: [facility];  Service: Endoscopy;  Laterality: N/A;  DIABETIC    Social History:  reports that she quit smoking about 25 years ago. Her smoking use included cigarettes. She has a 13.50 pack-year smoking history. She has never used smokeless tobacco. She reports current alcohol use. She reports that she does not use drugs.  Family History:  Family History  Problem Relation Age of Onset   Stroke Father    Hypertension Father    Hypertension Mother    Stroke Mother    Breast cancer Cousin        2 mat cousins   Breast cancer Cousin        pat cousin     Prior to  Admission medications   Medication Sig Start Date End Date Taking? Authorizing Provider  acetaminophen (TYLENOL) 500 MG tablet Take 500 mg by mouth as needed.    [provider]  Ascorbic Acid (VITAMIN C) 100 MG tablet Take 100 mg by mouth daily.    [provider]  aspirin 81 MG tablet Take 81 mg by mouth daily.    [provider]  carvedilol (COREG) 12.5 MG tablet Take 12.5 mg by mouth 2 (two) times daily with a meal. Reported on 10/08/2015     [provider]  furosemide (LASIX) 20 MG tablet Take 20 mg by mouth daily.    [provider]  gentamicin cream (GARAMYCIN) 0.1 % APPLY A COPIOUS AMOUNT TWICE A DAY TO AFFECTED AREA EXTERNALLY 7 DAYS 04/27/19   [provider]  Glucosamine-Chondroitin (OSTEO BI-FLEX REGULAR STRENGTH PO) Take 1 tablet by mouth daily.    [provider]  isosorbide mononitrate (IMDUR) 30 MG 24 hr tablet Take 30 mg by mouth daily. 01/13/18   [provider]  metFORMIN (GLUCOPHAGE) 500 MG tablet Take 500 mg by mouth 2 (two) times daily with a meal.  11/17/14   [provider]  PROAIR HFA 108 (90 Base) MCG/ACT inhaler INHALE TWO PUFFS BY MOUTH EVERY 2 TO 4 HOURS AS NEEDED 08/31/15   Mungal, Vishal, MD  simvastatin (ZOCOR) 40 MG tablet Take 40 mg by mouth daily at 6 PM.     [provider]  SPIRIVA HANDIHALER 18 MCG inhalation capsule Place 18 mcg into inhaler and inhale daily.  09/09/19   [provider]  valsartan (DIOVAN) 80 MG tablet Take 80 mg by mouth daily. 09/09/19   [provider]    Physical Exam: Vitals:   04/08/21 1415 04/08/21 1430 04/08/21 1445 04/08/21 1500  BP:  98/69    Pulse: 70 (!) 141 75 75  Resp: 17 (!) 22 18 18   Temp:      TempSrc:      SpO2: 94% 96% 95% 96%  Weight:      Height:       General: Not in acute distress HEENT:       Eyes: PERRL, EOMI, no scleral icterus.       ENT: No discharge from the ears and nose, no pharynx injection, no tonsillar enlargement.        Neck: positive JVD, no bruit, no mass felt. Heme: No neck lymph node enlargement. Cardiac: S1/S2, RRR, No gallops or rubs. Respiratory: Diffused crackles bilaterally GI: Soft, nondistended, nontender, no rebound pain, no organomegaly, BS present. GU: No hematuria Ext: has trace pitting leg edema bilaterally. 1+DP/PT pulse bilaterally. Musculoskeletal: No joint deformities, No joint redness or warmth, no limitation of ROM in spin. Skin: No  rashes.  Neuro: Alert, oriented X3, cranial nerves II-XII grossly intact, moves all extremities normally.  Psych: Patient is not psychotic, no suicidal or hemocidal ideation.  Labs on Admission: I have personally reviewed following labs and imaging studies  CBC: Recent Labs  Lab 04/08/21 1137  WBC 6.4  HGB 14.1  HCT 42.4  MCV 81.5  PLT 215   Basic Metabolic Panel: Recent Labs  Lab 04/08/21 1137  NA 139  K 3.8  CL 104  CO2 27  GLUCOSE 98  BUN 14  CREATININE 0.90  CALCIUM 9.7   GFR: Estimated Creatinine Clearance: 71.2 mL/min (by C-G formula based on SCr of 0.9 mg/dL). Liver Function Tests: No results for input(s): AST, ALT, ALKPHOS, BILITOT, PROT, ALBUMIN  in the last 168 hours. No results for input(s): LIPASE, AMYLASE in the last 168 hours. No results for input(s): AMMONIA in the last 168 hours. Coagulation Profile: No results for input(s): INR, PROTIME in the last 168 hours. Cardiac Enzymes: No results for input(s): CKTOTAL, CKMB, CKMBINDEX, TROPONINI in the last 168 hours. BNP (last 3 results) No results for input(s): PROBNP in the last 8760 hours. HbA1C: No results for input(s): HGBA1C in the last 72 hours. CBG: No results for input(s): GLUCAP in the last 168 hours. Lipid Profile: No results for input(s): CHOL, HDL, LDLCALC, TRIG, CHOLHDL, LDLDIRECT in the last 72 hours. Thyroid Function Tests: No results for input(s): TSH, T4TOTAL, FREET4, T3FREE, THYROIDAB in the last 72 hours. Anemia Panel: No results for input(s): VITAMINB12, FOLATE, FERRITIN, TIBC, IRON, RETICCTPCT in the last 72 hours. Urine analysis: No results found for: COLORURINE, APPEARANCEUR, LABSPEC, PHURINE, GLUCOSEU, HGBUR, BILIRUBINUR, KETONESUR, PROTEINUR, UROBILINOGEN, NITRITE, LEUKOCYTESUR Sepsis Labs: @LABRCNTIP (procalcitonin:4,lacticidven:4) ) Recent Results (from the past 240 hour(s))  Resp Panel by RT-PCR (Flu A&B, Covid) Nasopharyngeal Swab     Status: None   Collection Time:  04/08/21  1:11 PM   Specimen: Nasopharyngeal Swab; Nasopharyngeal(NP) swabs in vial transport medium  Result Value Ref Range Status   SARS Coronavirus 2 by RT PCR NEGATIVE NEGATIVE Final    Comment: (NOTE) SARS-CoV-2 target nucleic acids are NOT DETECTED.  The SARS-CoV-2 RNA is generally detectable in upper respiratory specimens during the acute phase of infection. The lowest concentration of SARS-CoV-2 viral copies this assay can detect is 138 copies/mL. A negative result does not preclude SARS-Cov-2 infection and should not be used as the sole basis for treatment or other patient management decisions. A negative result may occur with  improper specimen collection/handling, submission of specimen other than nasopharyngeal swab, presence of viral mutation(s) within the areas targeted by this assay, and inadequate number of viral copies(<138 copies/mL). A negative result must be combined with clinical observations, patient history, and epidemiological information. The expected result is Negative.  Fact Sheet for Patients:  04/10/21  Fact Sheet for Healthcare Providers:  BloggerCourse.com  This test is no t yet approved or cleared by the [place] FDA and  has been authorized for detection and/or diagnosis of SARS-CoV-2 by FDA under an Emergency Use Authorization (EUA). This EUA will remain  in effect (meaning this test can be used) for the duration of the COVID-19 declaration under Section 564(b)(1) of the Act, 21 U.S.C.section 360bbb-3(b)(1), unless the authorization is terminated  or revoked sooner.       Influenza A by PCR NEGATIVE NEGATIVE Final   Influenza B by PCR NEGATIVE NEGATIVE Final    Comment: (NOTE) The Xpert Xpress SARS-CoV-2/FLU/RSV plus assay is intended as an aid in the diagnosis of influenza from Nasopharyngeal swab specimens and should not be used as a sole basis for treatment. Nasal washings  and aspirates are unacceptable for Xpert Xpress SARS-CoV-2/FLU/RSV testing.  Fact Sheet for Patients: [place]  Fact Sheet for Healthcare Providers: BloggerCourse.com  This test is not yet approved or cleared by the [place] FDA and has been authorized for detection and/or diagnosis of SARS-CoV-2 by FDA under an Emergency Use Authorization (EUA). This EUA will remain in effect (meaning this test can be used) for the duration of the COVID-19 declaration under Section 564(b)(1) of the Act, 21 U.S.C. section 360bbb-3(b)(1), unless the authorization is terminated or revoked.  Performed at [facility], [address] [place], [place]  Radiological Exams on Admission: DG Chest 2 View  Result Date: 04/08/2021 CLINICAL DATA:  Chest pain EXAM: CHEST - 2 VIEW COMPARISON:  Radiograph 07/02/2018, chest CT 03/17/2019 FINDINGS: Unchanged cardiomediastinal silhouette. There is diffuse right lung airspace disease. No large pleural effusion or visible pneumothorax. There is no acute osseous abnormality. Thoracic spondylosis. IMPRESSION: Diffuse right lung airspace disease, concerning for multifocal pneumonia. Electronically Signed   By: Caprice Renshaw M.D.   On: 04/08/2021 13:28     EKG: I have personally reviewed.  Sinus rhythm, low voltage, LAE,  Assessment/Plan Principal Problem:   Acute on chronic diastolic CHF (congestive heart failure) (HCC) Active Problems:   Morbid obesity with BMI of 40.0-44.9, adult (HCC)   Benign essential HTN   Diabetes mellitus without complication (HCC)   COPD (chronic obstructive pulmonary disease) (HCC)   HLD (hyperlipidemia)   CAD (coronary artery disease)   Chest pain   Acute on chronic diastolic CHF (congestive heart failure) [facility]): Patient has leg edema, positive JVD, elevated BNP 223, diffuse crackles on auscultation, clinically consistent with CHF exacerbation.   Chest x-ray showed diffused multifocal opacity, but the patient does not have fever or leukocytosis.  Procalcitonin is < 0.10, clinically does not seem to have pneumonia.  Will treat patient as a CHF exacerbation.   -Will admit to progressive unit as inpatient -Lasix 40 mg bid by IV -2d echo -Daily weights -strict I/O's -Low salt diet -Fluid restriction  Morbid obesity with BMI of 40.0-44.9, adult (HCC) -Diet and exercise.   -Encouraged to lose weight.   Benign essential HTN -IV hydralazine as needed -Coreg, irbesartan  Diabetes mellitus without complication [facility]): Patient states that she has prediabetes.  No A1c available.  Patient is taking metformin at home.  Blood sugar 98 -Check A1c -Check blood sugar every morning  COPD (chronic obstructive pulmonary disease) (HCC) -Bronchodilators  HLD (hyperlipidemia) -Zocor  CAD (coronary artery disease) and Chest pain: Patient has very mild chest pain which is likely due to demand ischemia. Trop 4. -Continue Zocor, aspirin, imdur, coreg    DVT ppx: SQ Lovenox Code Status: Full code Family Communication:   Yes, patient's husband at bed side Disposition Plan:  Anticipate discharge back to previous environment Consults called:  none Admission status and Level of care: Progressive Cardiac:    as inpt      Status is: Inpatient  Remains inpatient appropriate because: Patient has multiple comorbidities, now presents with acute on chronic diastolic CHF exacerbation.  On physical examination, patient has diffused crackles on auscultation, chest x-ray showed diffused multifocal opacity, her presentation is highly complicated.  Patient is at high risk of deteriorating.  Patient will need to be treated in hospital for at least 2 days           Date of Service 04/08/2021    Lorretta Harp [facility]   If 7PM-7AM, please contact night-coverage [email or web address] 04/08/2021, 6:25 PM

## 2021-04-09 ENCOUNTER — Inpatient Hospital Stay
Admit: 2021-04-09 | Discharge: 2021-04-09 | Disposition: A | Discharge status: Home / Self Care | Payer: PPO | Attending: Internal Medicine | Admitting: Internal Medicine

## 2021-04-09 DIAGNOSIS — I5033 Acute on chronic diastolic (congestive) heart failure: Secondary | ICD-10-CM | POA: Diagnosis not present

## 2021-04-09 DIAGNOSIS — I10 Essential (primary) hypertension: Secondary | ICD-10-CM | POA: Diagnosis not present

## 2021-04-09 LAB — TROPONIN I (HIGH SENSITIVITY)
Troponin I (High Sensitivity): 5 ng/L (ref <18)
Troponin I (High Sensitivity): 5 ng/L (ref <18)

## 2021-04-09 LAB — LIPID PANEL
Cholesterol: 151 mg/dL (ref 0–200)
HDL: 59 mg/dL (ref >40)
LDL Cholesterol: 79 mg/dL (ref 0–99)
Total CHOL/HDL Ratio: 2.6 RATIO
Triglycerides: 64 mg/dL (ref <150)
VLDL: 13 mg/dL (ref 0–40)

## 2021-04-09 LAB — BASIC METABOLIC PANEL
Anion gap: 8 (ref 5–15)
BUN: 15 mg/dL (ref 8–23)
CO2: 26 mmol/L (ref 22–32)
Calcium: 9 mg/dL (ref 8.9–10.3)
Chloride: 103 mmol/L (ref 98–111)
Creatinine, Ser: 0.81 mg/dL (ref 0.44–1.00)
GFR, Estimated: >60 mL/min (ref >60)
Glucose, Bld: 99 mg/dL (ref 70–99)
Potassium: 3.4 mmol/L — ABNORMAL LOW (ref 3.5–5.1)
Sodium: 137 mmol/L (ref 135–145)

## 2021-04-09 LAB — ECHOCARDIOGRAM COMPLETE
AR max vel: 2.2 cm2
AV Area VTI: 2.24 cm2
AV Area mean vel: 2.09 cm2
AV Mean grad: 6 mmHg
AV Peak grad: 10.5 mmHg
Ao pk vel: 1.62 m/s
Area-P 1/2: 4.08 cm2
Height: 64 in
MV VTI: 2.06 cm2
S' Lateral: 3.6 cm
Weight: 3840 oz

## 2021-04-09 LAB — HEMOGLOBIN A1C
Hgb A1c MFr Bld: 6 % — ABNORMAL HIGH (ref 4.8–5.6)
Mean Plasma Glucose: 125.5 mg/dL

## 2021-04-09 LAB — MAGNESIUM: Magnesium: 1.9 mg/dL (ref 1.7–2.4)

## 2021-04-09 LAB — HIV ANTIBODY (ROUTINE TESTING W REFLEX): HIV Screen 4th Generation wRfx: NONREACTIVE

## 2021-04-09 MED ORDER — POTASSIUM CHLORIDE CRYS ER 20 MEQ PO TBCR
20.0000 meq | EXTENDED_RELEASE_TABLET | Freq: Once | ORAL | Status: AC
  Administered 2021-04-09: 20 meq via ORAL
  Filled 2021-04-09: qty 1

## 2021-04-09 NOTE — Progress Notes (Signed)
*  PRELIMINARY RESULTS* Echocardiogram 2D Echocardiogram has been performed.  Sarah [name] 04/09/2021, 12:06 PM

## 2021-04-09 NOTE — ED Notes (Signed)
Pt ambulated to the restroom without assistnace - pt provided nonslip socks and a gown prior to going to the restroom and a patient belongings bag was provided to her husband for her clothing.

## 2021-04-09 NOTE — TOC Initial Note (Signed)
Transition of Care [facility]) - Progression Note    Patient Details  Name: Sarah [name] MRN: [id number] Date of Birth: 1951/04/17  Transition of Care [facility]) CM/SW Contact  Marina Goodell Phone Number: [phone] 04/09/2021, 1:45 PM  Clinical Narrative:     Patient presents to [facility] ED due to intermittent chest pain for the past 2 weeks.  Patient is independent with all ADLS, and is still employed.  Patient was able to ambulate to restroom in ED, unassisted.  Main contact Marvina, Danner (Spouse)  [phone] [facility] Phone) and Idolina Primer Daughter [phone] and Lester Paradis Daughter  [phone].  TOC will continue to follow for discharge planning.   Expected Discharge Plan: Home w Home Health Services Barriers to Discharge: Continued Medical Work up  Expected Discharge Plan and Services Expected Discharge Plan: Home w Home Health Services In-house Referral: Clinical Social Work   Post Acute Care Choice: Home Health Living arrangements for the past 2 months: Single Family Home                                       Social Determinants of Health (SDOH) Interventions    Readmission Risk Interventions No flowsheet data found.

## 2021-04-09 NOTE — Progress Notes (Signed)
PROGRESS NOTE   HPI was taken from Dr. Clyde Lundborg: Sarah [name] is a 70 y.o. female with medical history significant of dCHF, hypertension, hyperlipidemia, pre-diabetes, COPD, CAD, obesity with BMI 41, carotid artery stenosis, who presents with shortness of breath.   Patient states that she has been having shortness of breath for about 2 weeks.  She has cough with pink frothy production.  No fever or chills.  She has several pounds of weight gain recently.  Patient states that she had some chest discomfort and mild chest pain intermittently.  Currently no active chest pain.  Patient has nausea, vomiting, diarrhea or abdominal pain.  No symptoms of UTI.  Patient stated she is taking metformin for prediabetes.   ED Course: pt was found to have BNP 223, troponin level 4, negative COVID PCR, procalcitonin <0.010, electrolytes renal function okay, temperature normal, blood pressure 140/92, heart rate 78, RR 20, oxygen saturation 92% on room air patient was placed on 2 L oxygen.  Chest x-ray showed diffused multifocal opacity.  Patient is admitted to progressive bed as inpatient  Aerica [name]  BJY:[id number] DOB: Jun 03, 1951 DOA: 04/08/2021 PCP: Irena Reichmann, DO   Assessment & Plan:   Principal Problem:   Acute on chronic diastolic CHF (congestive heart failure) (HCC) Active Problems:   Morbid obesity with BMI of 40.0-44.9, adult (HCC)   Benign essential HTN   Diabetes mellitus without complication (HCC)   COPD (chronic obstructive pulmonary disease) (HCC)   HLD (hyperlipidemia)   CAD (coronary artery disease)   Chest pain  Acute on chronic diastolic CHF : has leg edema, positive JVD, elevated BNP 223, diffuse crackles on auscultation, clinically consistent with CHF exacerbation.  Chest x-ray showed diffused multifocal opacity, but the patient does not have fever or leukocytosis.  Procalcitonin is < 0.10, clinically does not seem to have pneumonia.  Continue on IV lasix. Monitor I/Os and daily  weights. Fluid restriction. Echo is pending   Hypokalemia: KCl repleated.    Morbid obesity: BMU 41.2. Complicates overall care & prognosis  HTN: continue on home dose of coreg, irbesartan. IV hydralazine prn    PreDM: as per pt. HbA1c is pending.    COPD: w/o exacerbation. Continue on bronchodilators  HLD: continue on statin    Hx of CAD: w/ chest pain. Troponins are not elevated. Continue on home dose of aspirin, coreg, imdur, statin    DVT prophylaxis: lovenox  Code Status: full  Family Communication:  Disposition Plan: depends on PT/OT recs (not consulted yet)   Level of care: Progressive Cardiac  Status is: Inpatient  Remains inpatient appropriate because: severity of illness    Consultants:    Procedures:   Antimicrobials:    Subjective: Pt c/o shortness of breath, improved slightly from day prior  Objective: Vitals:   04/09/21 1300 04/09/21 1330 04/09/21 1400 04/09/21 1430  BP: 112/90 91/60 102/62 96/65  Pulse: 67  76 76  Resp: 14 12 15 19   Temp:      TempSrc:      SpO2: 97%  95% 95%  Weight:      Height:        Intake/Output Summary (Last 24 hours) at 04/09/2021 1532 Last data filed at 04/08/2021 1607 Gross per 24 hour  Intake --  Output 400 ml  Net -400 ml   Filed Weights   04/08/21 1134  Weight: 108.9 kg    Examination:  General exam: Appears calm and comfortable  Respiratory system: diminished breath sounds b/l  Cardiovascular system: S1 &  S2 +. No rubs, gallops or clicks.  Gastrointestinal system: Abdomen is obese, soft and nontender. Normal bowel sounds heard. Central nervous system: Alert and oriented. Moves all extremities  Psychiatry: Judgement and insight appear normal. Flat mood and affect    Data Reviewed: I have personally reviewed following labs and imaging studies  CBC: Recent Labs  Lab 04/08/21 1137  WBC 6.4  HGB 14.1  HCT 42.4  MCV 81.5  PLT 215   Basic Metabolic Panel: Recent Labs  Lab 04/08/21 1137  04/09/21 0806  NA 139 137  K 3.8 3.4*  CL 104 103  CO2 27 26  GLUCOSE 98 99  BUN 14 15  CREATININE 0.90 0.81  CALCIUM 9.7 9.0  MG  --  1.9   GFR: Estimated Creatinine Clearance: 79.1 mL/min (by C-G formula based on SCr of 0.81 mg/dL). Liver Function Tests: No results for input(s): AST, ALT, ALKPHOS, BILITOT, PROT, ALBUMIN in the last 168 hours. No results for input(s): LIPASE, AMYLASE in the last 168 hours. No results for input(s): AMMONIA in the last 168 hours. Coagulation Profile: No results for input(s): INR, PROTIME in the last 168 hours. Cardiac Enzymes: No results for input(s): CKTOTAL, CKMB, CKMBINDEX, TROPONINI in the last 168 hours. BNP (last 3 results) No results for input(s): PROBNP in the last 8760 hours. HbA1C: Recent Labs    04/09/21 0806  HGBA1C 6.0*   CBG: No results for input(s): GLUCAP in the last 168 hours. Lipid Profile: Recent Labs    04/09/21 0806  CHOL 151  HDL 59  LDLCALC 79  TRIG 64  CHOLHDL 2.6   Thyroid Function Tests: No results for input(s): TSH, T4TOTAL, FREET4, T3FREE, THYROIDAB in the last 72 hours. Anemia Panel: No results for input(s): VITAMINB12, FOLATE, FERRITIN, TIBC, IRON, RETICCTPCT in the last 72 hours. Sepsis Labs: Recent Labs  Lab 04/08/21 1137  PROCALCITON <0.10    Recent Results (from the past 240 hour(s))  Resp Panel by RT-PCR (Flu A&B, Covid) Nasopharyngeal Swab     Status: None   Collection Time: 04/08/21  1:11 PM   Specimen: Nasopharyngeal Swab; Nasopharyngeal(NP) swabs in vial transport medium  Result Value Ref Range Status   SARS Coronavirus 2 by RT PCR NEGATIVE NEGATIVE Final    Comment: (NOTE) SARS-CoV-2 target nucleic acids are NOT DETECTED.  The SARS-CoV-2 RNA is generally detectable in upper respiratory specimens during the acute phase of infection. The lowest concentration of SARS-CoV-2 viral copies this assay can detect is 138 copies/mL. A negative result does not preclude SARS-Cov-2 infection  and should not be used as the sole basis for treatment or other patient management decisions. A negative result may occur with  improper specimen collection/handling, submission of specimen other than nasopharyngeal swab, presence of viral mutation(s) within the areas targeted by this assay, and inadequate number of viral copies(<138 copies/mL). A negative result must be combined with clinical observations, patient history, and epidemiological information. The expected result is Negative.  Fact Sheet for Patients:  BloggerCourse.com  Fact Sheet for Healthcare Providers:  SeriousBroker.it  This test is no t yet approved or cleared by the [place] FDA and  has been authorized for detection and/or diagnosis of SARS-CoV-2 by FDA under an Emergency Use Authorization (EUA). This EUA will remain  in effect (meaning this test can be used) for the duration of the COVID-19 declaration under Section 564(b)(1) of the Act, 21 U.S.C.section 360bbb-3(b)(1), unless the authorization is terminated  or revoked sooner.       Influenza  A by PCR NEGATIVE NEGATIVE Final   Influenza B by PCR NEGATIVE NEGATIVE Final    Comment: (NOTE) The Xpert Xpress SARS-CoV-2/FLU/RSV plus assay is intended as an aid in the diagnosis of influenza from Nasopharyngeal swab specimens and should not be used as a sole basis for treatment. Nasal washings and aspirates are unacceptable for Xpert Xpress SARS-CoV-2/FLU/RSV testing.  Fact Sheet for Patients: BloggerCourse.com  Fact Sheet for Healthcare Providers: [employer]iousBroker.it  This test is not yet approved or cleared by the [place] FDA and has been authorized for detection and/or diagnosis of SARS-CoV-2 by FDA under an Emergency Use Authorization (EUA). This EUA will remain in effect (meaning this test can be used) for the duration of the COVID-19 declaration  under Section 564(b)(1) of the Act, 21 U.S.C. section 360bbb-3(b)(1), unless the authorization is terminated or revoked.  Performed at [facility], [address] [place], [place] [address]          Radiology Studies: DG Chest 2 View  Result Date: 04/08/2021 CLINICAL DATA:  Chest pain EXAM: CHEST - 2 VIEW COMPARISON:  Radiograph 07/02/2018, chest CT 03/17/2019 FINDINGS: Unchanged cardiomediastinal silhouette. There is diffuse right lung airspace disease. No large pleural effusion or visible pneumothorax. There is no acute osseous abnormality. Thoracic spondylosis. IMPRESSION: Diffuse right lung airspace disease, concerning for multifocal pneumonia. Electronically Signed   By: Caprice Renshaw M.D.   On: 04/08/2021 13:28        Scheduled Meds:  ascorbic acid  250 mg Oral Daily   aspirin EC  81 mg Oral Daily   carvedilol  12.5 mg Oral BID WC   enoxaparin (LOVENOX) injection  0.5 mg/kg Subcutaneous Q24H   furosemide  40 mg Intravenous Q12H   irbesartan  75 mg Oral Daily   isosorbide mononitrate  30 mg Oral Daily   multivitamin with minerals  1 tablet Oral Daily   simvastatin  40 mg Oral q1800   tiotropium  18 mcg Inhalation Daily   Continuous Infusions:   LOS: 1 day    Time spent: 30 mins     Charise Killian, MD [facility] Pager [phone]  If 7PM-7AM, please contact night-coverage  04/09/2021, 3:32 PM

## 2021-04-09 NOTE — ED Notes (Signed)
Pt sleeping   family with pt   

## 2021-04-10 ENCOUNTER — Inpatient Hospital Stay: Payer: PPO

## 2021-04-10 DIAGNOSIS — M25511 Pain in right shoulder: Secondary | ICD-10-CM

## 2021-04-10 DIAGNOSIS — I5043 Acute on chronic combined systolic (congestive) and diastolic (congestive) heart failure: Secondary | ICD-10-CM | POA: Diagnosis not present

## 2021-04-10 DIAGNOSIS — I10 Essential (primary) hypertension: Secondary | ICD-10-CM | POA: Diagnosis not present

## 2021-04-10 LAB — BASIC METABOLIC PANEL
Anion gap: 6 (ref 5–15)
BUN: 16 mg/dL (ref 8–23)
CO2: 27 mmol/L (ref 22–32)
Calcium: 9.1 mg/dL (ref 8.9–10.3)
Chloride: 105 mmol/L (ref 98–111)
Creatinine, Ser: 0.77 mg/dL (ref 0.44–1.00)
GFR, Estimated: >60 mL/min (ref >60)
Glucose, Bld: 102 mg/dL — ABNORMAL HIGH (ref 70–99)
Potassium: 3.7 mmol/L (ref 3.5–5.1)
Sodium: 138 mmol/L (ref 135–145)

## 2021-04-10 LAB — CBC
HCT: 39.7 % (ref 36.0–46.0)
Hemoglobin: 13.4 g/dL (ref 12.0–15.0)
MCH: 27.2 pg (ref 26.0–34.0)
MCHC: 33.8 g/dL (ref 30.0–36.0)
MCV: 80.5 fL (ref 80.0–100.0)
Platelets: 205 10*3/uL (ref 150–400)
RBC: 4.93 MIL/uL (ref 3.87–5.11)
RDW: 14.7 % (ref 11.5–15.5)
WBC: 7.3 10*3/uL (ref 4.0–10.5)
nRBC: 0 % (ref 0.0–0.2)

## 2021-04-10 MED ORDER — FUROSEMIDE 10 MG/ML IJ SOLN
40.0000 mg | Freq: Two times a day (BID) | INTRAMUSCULAR | Status: DC
  Administered 2021-04-10 – 2021-04-11 (×2): 40 mg via INTRAVENOUS
  Filled 2021-04-10 (×2): qty 4

## 2021-04-10 MED ORDER — KETOROLAC TROMETHAMINE 15 MG/ML IJ SOLN: 15.0000 mg | Freq: Three times a day (TID) | INTRAMUSCULAR | Status: DC | PRN

## 2021-04-10 MED ORDER — SPIRONOLACTONE 25 MG PO TABS
12.5000 mg | ORAL_TABLET | Freq: Every day | ORAL | Status: DC
  Administered 2021-04-10 – 2021-04-11 (×2): 12.5 mg via ORAL
  Filled 2021-04-10: qty 0.5
  Filled 2021-04-10: qty 1
  Filled 2021-04-10: qty 0.5

## 2021-04-10 MED ORDER — OXYCODONE HCL 5 MG PO TABS
5.0000 mg | ORAL_TABLET | ORAL | Status: AC | PRN
  Administered 2021-04-10 (×2): 5 mg via ORAL
  Filled 2021-04-10 (×2): qty 1

## 2021-04-10 MED ORDER — OXYCODONE HCL 5 MG PO TABS
5.0000 mg | ORAL_TABLET | Freq: Four times a day (QID) | ORAL | Status: DC | PRN
  Administered 2021-04-10 – 2021-04-11 (×2): 5 mg via ORAL
  Filled 2021-04-10 (×2): qty 1

## 2021-04-10 NOTE — Plan of Care (Signed)

## 2021-04-10 NOTE — Evaluation (Signed)
Occupational Therapy Evaluation Patient Details Name: Sarah [name] MRN: [id number] DOB: [birth year] Today's Date: 04/10/2021   History of Present Illness Sarah [name] is a 70 y.o. female with medical history significant of dCHF, hypertension, hyperlipidemia, pre-diabetes, COPD, CAD, obesity with BMI 41, carotid artery stenosis, who presents with shortness of breath.   Clinical Impression   Sarah [name] was seen for OT evaluation this date. Prior to hospital admission, pt was Independent for mobility and ADLs - PRN assist from husband for fastening bra. Pt lives with husband available 24/7. Pt presents to acute OT demonstrating near baseline independence for mobility and ADLs. Extensive education for husband and pt re: OT role, DME recs, d/c recs, falls prevention, adapted dressing techniques, home/routines modifications, and joint protectino strategies. All education complete and no skilled acute OT needs identified. Will sign off. Please re-consult if additional OT needs arise.        Recommendations for follow up therapy are one component of a multi-disciplinary discharge planning process, led by the attending physician.  Recommendations may be updated based on patient status, additional functional criteria and insurance authorization.   Follow Up Recommendations  No OT follow up    Assistance Recommended at Discharge None  Functional Status Assessment  Patient has not had a recent decline in their functional status  Equipment Recommendations  None recommended by OT    Recommendations for Other Services       Precautions / Restrictions Precautions Precautions: None Restrictions Weight Bearing Restrictions: No      Mobility Bed Mobility Overal bed mobility: Independent                  Transfers Overall transfer level: Independent                        Balance Overall balance assessment: Independent                                          ADL either performed or assessed with clinical judgement   ADL Overall ADL's : Independent                                              Pertinent Vitals/Pain Pain Assessment: Faces Faces Pain Scale: Hurts a little bit Pain Location: R shoulder Pain Descriptors / Indicators: Discomfort;Dull;Grimacing Pain Intervention(s): Repositioned     Hand Dominance Right   Extremity/Trunk Assessment Upper Extremity Assessment Upper Extremity Assessment: Overall WFL for tasks assessed;RUE deficits/detail RUE Deficits / Details: + empty can test indicating pain in the supraspinatus muscle. ROM and strength WFL   Lower Extremity Assessment Lower Extremity Assessment: Overall WFL for tasks assessed       Communication Communication Communication: No difficulties   Cognition Arousal/Alertness: Awake/alert Behavior During Therapy: WFL for tasks assessed/performed Overall Cognitive Status: Within Functional Limits for tasks assessed                                       General Comments       Exercises Exercises: Other exercises Other Exercises Other Exercises: Pt and husband educated re: OT role, DME recs, d/c recs, falls prevention, adapted dressing techniques, home/routines  modifications, and joint protectino strategies Other Exercises: grooming, sit<>stand, sitting/standing balnace/tolerance   Shoulder Instructions      Home Living Family/patient expects to be discharged to:: Private residence Living Arrangements: Spouse/significant other Available Help at Discharge: Family;Available 24 hours/day Type of Home: House Home Access: Stairs to enter [employer] of Steps: 2 Entrance Stairs-Rails: Can reach both Home Layout: One level               Home Equipment: None          Prior Functioning/Environment Prior Level of Function : Independent/Modified Independent;Driving                        OT Problem  List: Impaired UE functional use         OT Goals(Current goals can be found in the care plan section) Acute Rehab OT Goals Patient Stated Goal: to go home OT Goal Formulation: With patient/family Time For Goal Achievement: 04/24/21 Potential to Achieve Goals: Good   AM-PAC OT "6 Clicks" Daily Activity     Outcome Measure Help from another person eating meals?: None Help from another person taking care of personal grooming?: None Help from another person toileting, which includes using toliet, bedpan, or urinal?: None Help from another person bathing (including washing, rinsing, drying)?: None Help from another person to put on and taking off regular upper body clothing?: None Help from another person to put on and taking off regular lower body clothing?: None 6 Click Score: 24   End of Session    Activity Tolerance: Patient tolerated treatment well Patient left: in bed;with call bell/phone within reach;with family/visitor present  OT Visit Diagnosis: Unsteadiness on feet (R26.81)                Time: 7972-8206 OT Time Calculation (min): 18 min Charges:  OT General Charges $OT Visit: 1 Visit OT Evaluation $OT Eval Low Complexity: 1 Low OT Treatments $Self Care/Home Management : 8-22 mins  Kathie Dike, M.S. OTR/L  04/10/21, 4:13 PM  ascom [phone]

## 2021-04-10 NOTE — Consult Note (Signed)
   Heart Failure Nurse Navigator Note  HFmrEF 45-50%.  81 diastolic dysfunction.  Normal right ventricular systolic function.  She presented to the emergency room from home with complaints of shortness of breath, fatigue, starting 2 weeks ago along with a cough productive of pink frothy sputum.  Chest x-ray revealed diffuse multifocal opacities BNP was elevated at 223.  Comorbidities:  Hypertension Hyperlipidemia COPD Coronary artery disease Obesity Carotid artery stenosis  Labs:  Sodium 138, potassium 3.7, chloride 105, CO2 27, BUN 16, creatinine 0.97  Medications:  Aspirin 81 mg daily Coreg 12-1/2 mg twice a day Avapro 75 mg daily Isosorbide mononitrate 30 mg daily Zocor 40 mg daily Aldactone 12 and half milligrams daily Lasix 40 mg 2 times a day IV    Initial meeting with patient today, she was sitting on the edge of the bed, denied any worsening shortness of breath but states that her stomach feels upset that she ate something that is not agreeing with her.  She states the last time she was hospitalized was 20 years ago for congestive heart failure.  She admits to not following a strict low-sodium diet as she just got married within the last year and she cooks with salt for her new husband.  She states that she does not use it at the table.  Discussed spices and other things that used to seasoned foods.  She states that she weighs herself Monday Wednesday and Friday.  Discussed the importance of daily weights after going to the bathroom and emptying her bladder and recording.  That way she can catch as she is starting to gain weight before it gets too much ahead of her, instructed on 2 pounds overnight or 5 pounds total within the week.  She states that she just retired in July and is not as active as she had been when working and is looking at getting back at that.  She was given the living with heart failure teaching booklet along with information on low-sodium diet.   Also discussed follow-up in the outpatient heart failure clinic she has an appointment on November 2 at 1:30 in the afternoon. She has a 7% no-show rating-3 out of 45 appointments.  Also offered to give her the low-sodium cookbook.  She thought that that would be a good idea.  Tresa Endo RN [facility]

## 2021-04-10 NOTE — ED Notes (Signed)
Report messaged to [employer] floor nurse

## 2021-04-10 NOTE — Progress Notes (Signed)
Patient ID: Sarah [name], female   DOB: [birth year], 70 y.o.   MRN: [id number] [facility] PROGRESS NOTE  Sarah [name] SAY:[id number] DOB: 09-Sep-1950 DOA: 04/08/2021 PCP: Irena Reichmann, DO  HPI/Subjective: Patient came in with shortness of breath.  Feeling a little bit better today but does not feel that her breathing is back to her baseline yet.  Having some right shoulder pain.  She has had this previously in the past.  The pain medication has helped.  Objective: Vitals:   04/10/21 0722 04/10/21 1116  BP: 139/83 130/80  Pulse: 60 68  Resp: 18 18  Temp: 97.9 F (36.6 C) 98.2 F (36.8 C)  SpO2: 99% 99%    Intake/Output Summary (Last 24 hours) at 04/10/2021 1506 Last data filed at 04/10/2021 1400 Gross per 24 hour  Intake 240 ml  Output 1500 ml  Net -1260 ml   Filed Weights   04/08/21 1134 04/10/21 0101  Weight: 108.9 kg 107.9 kg    ROS: Review of Systems  Respiratory:  Positive for cough and shortness of breath.   Cardiovascular:  Negative for chest pain.  Gastrointestinal:  Negative for abdominal pain, nausea and vomiting.  Musculoskeletal:  Positive for joint pain.  Exam: Physical Exam HENT:     Head: Normocephalic.     Mouth/Throat:     Pharynx: No oropharyngeal exudate.  Eyes:     General: Lids are normal.     Conjunctiva/sclera: Conjunctivae normal.  Cardiovascular:     Rate and Rhythm: Normal rate and regular rhythm.     Heart sounds: Normal heart sounds, S1 normal and S2 normal.  Pulmonary:     Breath sounds: Examination of the right-lower field reveals decreased breath sounds. Examination of the left-lower field reveals decreased breath sounds. Decreased breath sounds present. No wheezing, rhonchi or rales.  Musculoskeletal:     Right lower leg: No swelling.  Skin:    General: Skin is warm.     Findings: No rash.  Neurological:     Mental Status: She is alert and oriented to person, place, and time.      Scheduled Meds:  ascorbic acid   250 mg Oral Daily   aspirin EC  81 mg Oral Daily   carvedilol  12.5 mg Oral BID WC   enoxaparin (LOVENOX) injection  0.5 mg/kg Subcutaneous Q24H   furosemide  40 mg Intravenous BID   irbesartan  75 mg Oral Daily   isosorbide mononitrate  30 mg Oral Daily   multivitamin with minerals  1 tablet Oral Daily   simvastatin  40 mg Oral q1800   spironolactone  12.5 mg Oral Daily   tiotropium  18 mcg Inhalation Daily     Assessment/Plan:  Acute on chronic combined diastolic and systolic congestive heart failure.  Continue IV Lasix 40 mg IV twice daily.  Patient already on Coreg and irbesartan.  Add spironolactone.  Will need follow-up in the CHF clinic. Right shoulder pain with limited motion.  She stated that the pain medication has helped.  We will get Occupational Therapy consultation.  Likely will need outpatient PT.  X-ray right shoulder unrevealing.  As needed Toradol for moderate pain as needed oxycodone for severe pain Morbid obesity with a BMI of 40.84 Essential hypertension on Coreg irbesartan and now spironolactone Impaired fasting glucose.  Hemoglobin A1c 6.0 COPD.  Respiratory status stable.  Continue Spiriva Hyperlipidemia unspecified on simvastatin        Code Status:     Code Status Orders  (From  admission, onward)           Start     Ordered   04/08/21 2005  Full code  Continuous        04/08/21 2004           Code Status History     This patient has a current code status but no historical code status.      Family Communication: Spoke with husband on the phone Disposition Plan: Status is: Inpatient  Time spent: 28 minutes  Rael Tilly [employer]

## 2021-04-11 DIAGNOSIS — R7301 Impaired fasting glucose: Secondary | ICD-10-CM

## 2021-04-11 LAB — BASIC METABOLIC PANEL
Anion gap: 4 — ABNORMAL LOW (ref 5–15)
BUN: 13 mg/dL (ref 8–23)
CO2: 28 mmol/L (ref 22–32)
Calcium: 8.8 mg/dL — ABNORMAL LOW (ref 8.9–10.3)
Chloride: 104 mmol/L (ref 98–111)
Creatinine, Ser: 0.91 mg/dL (ref 0.44–1.00)
GFR, Estimated: >60 mL/min (ref >60)
Glucose, Bld: 100 mg/dL — ABNORMAL HIGH (ref 70–99)
Potassium: 4.2 mmol/L (ref 3.5–5.1)
Sodium: 136 mmol/L (ref 135–145)

## 2021-04-11 MED ORDER — FUROSEMIDE 40 MG PO TABS: 40.0000 mg | ORAL_TABLET | Freq: Two times a day (BID) | ORAL | 0 refills | Status: DC

## 2021-04-11 MED ORDER — SPIRONOLACTONE 25 MG PO TABS: 12.5000 mg | ORAL_TABLET | Freq: Every day | ORAL | 0 refills | Status: AC

## 2021-04-11 NOTE — Discharge Summary (Signed)
[facility] - [place] [facility]   PATIENT NAME: Sarah [name]    MR#:  [id number]  DATE OF BIRTH:  1950/10/26  DATE OF ADMISSION:  04/08/2021 ADMITTING PHYSICIAN: Lorretta Harp, MD  DATE OF DISCHARGE: 04/11/2021  1:50 PM  PRIMARY CARE PHYSICIAN: Irena Reichmann, DO    ADMISSION DIAGNOSIS:  Acute respiratory failure with hypoxia (HCC) [J96.01] Acute on chronic diastolic CHF (congestive heart failure) (HCC) [I50.33] Congestive heart failure, unspecified HF chronicity, unspecified heart failure type (HCC) [I50.9]  DISCHARGE DIAGNOSIS:  Principal Problem:   Acute on chronic combined systolic and diastolic CHF (congestive heart failure) (HCC) Active Problems:   Morbid obesity with BMI of 40.0-44.9, adult (HCC)   Essential hypertension   Diabetes mellitus without complication (HCC)   COPD (chronic obstructive pulmonary disease) (HCC)   HLD (hyperlipidemia)   CAD (coronary artery disease)   Chest pain   Acute pain of right shoulder   SECONDARY DIAGNOSIS:   Past Medical History:  Diagnosis Date   Carotid artery disease (HCC)    Congestive heart failure (HCC)    COPD (chronic obstructive pulmonary disease) (HCC)    Diabetes mellitus without complication (HCC)    pt denies, however is on metformin daily   HBP (high blood pressure)    Heart murmur     HOSPITAL COURSE:   Acute on chronic combined diastolic and systolic congestive heart failure.  Patient was given Lasix 40 mg IV twice a day here in the hospital.  Will be switched over to Lasix 40 mg p.o. twice a day upon discharge home.  Patient is already on Coreg and irbesartan.  I added low-dose spironolactone.  We will have her follow-up in the CHF clinic.  Recommend checking a BMP as outpatient.  Daily weights. Right shoulder pain with limited motion.  She has better range of motion of her right shoulder today.  X-ray unrevealing.  Can follow-up with Dr. Hyacinth Meeker if no improvement. Morbid obesity with a BMI of  40.99 Essential hypertension.  Blood pressure on the lower side with Coreg, irbesartan spironolactone and Lasix. Impaired fasting glucose.  Hemoglobin A1c 6.0. COPD.  Respiratory status stable.  Continue Spiriva Hyperlipidemia unspecified on simvastatin.  DISCHARGE CONDITIONS:   Satisfactory  CONSULTS OBTAINED:  None  DRUG ALLERGIES:  No Known Allergies  DISCHARGE MEDICATIONS:   Allergies as of 04/11/2021   No Known Allergies      Medication List     STOP taking these medications    gentamicin cream 0.1 % Commonly known as: GARAMYCIN   metFORMIN 500 MG tablet Commonly known as: GLUCOPHAGE   ProAir HFA 108 (90 Base) MCG/ACT inhaler Generic drug: albuterol       TAKE these medications    acetaminophen 500 MG tablet Commonly known as: TYLENOL Take 500 mg by mouth as needed.   aspirin 81 MG tablet Take 81 mg by mouth daily.   carvedilol 12.5 MG tablet Commonly known as: COREG Take 12.5 mg by mouth 2 (two) times daily with a meal. Reported on 10/08/2015   CENTRUM SILVER PO Take 1 tablet by mouth daily.   diphenhydramine-acetaminophen 25-500 MG Tabs tablet Commonly known as: TYLENOL PM Take 1 tablet by mouth at bedtime as needed.   furosemide 40 MG tablet Commonly known as: Lasix Take 1 tablet (40 mg total) by mouth 2 (two) times daily. What changed:  medication strength how much to take when to take this   isosorbide mononitrate 30 MG 24 hr tablet Commonly known as: IMDUR  Take 30 mg by mouth daily.   OSTEO BI-FLEX REGULAR STRENGTH PO Take 1 tablet by mouth daily.   simvastatin 40 MG tablet Commonly known as: ZOCOR Take 40 mg by mouth daily at 6 PM.   Spiriva HandiHaler 18 MCG inhalation capsule Generic drug: tiotropium Place 18 mcg into inhaler and inhale daily.   spironolactone 25 MG tablet Commonly known as: ALDACTONE Take 0.5 tablets (12.5 mg total) by mouth daily. Start taking on: April 12, 2021   valsartan 80 MG tablet Commonly  known as: DIOVAN Take 1 tablet by mouth daily.   vitamin C 100 MG tablet Take 100 mg by mouth daily.         DISCHARGE INSTRUCTIONS:   Follow-up PMD 5 days Follow-up Dr. Gwen Pounds cardiology Follow-up CHF clinic Follow-up Dr. Hyacinth Meeker orthopedics if shoulder still bothersome in a couple weeks  If you experience worsening of your admission symptoms, develop shortness of breath, life threatening emergency, suicidal or homicidal thoughts you must seek medical attention immediately by calling 911 or calling your MD immediately  if symptoms less severe.  You Must read complete instructions/literature along with all the possible adverse reactions/side effects for all the Medicines you take and that have been prescribed to you. Take any new Medicines after you have completely understood and accept all the possible adverse reactions/side effects.   Please note  You were cared for by a hospitalist during your hospital stay. If you have any questions about your discharge medications or the care you received while you were in the hospital after you are discharged, you can call the unit and asked to speak with the hospitalist on call if the hospitalist that took care of you is not available. Once you are discharged, your primary care physician will handle any further medical issues. Please note that NO REFILLS for any discharge medications will be authorized once you are discharged, as it is imperative that you return to your primary care physician (or establish a relationship with a primary care physician if you do not have one) for your aftercare needs so that they can reassess your need for medications and monitor your lab values.    Today   CHIEF COMPLAINT:   Chief Complaint  Patient presents with   Shortness of Breath   Chest Pain   Cough    HISTORY OF PRESENT ILLNESS:  Sarah [name]  is a 70 y.o. female admitted with shortness of breath   VITAL SIGNS:  Blood pressure 91/65, pulse  91, temperature 98.3 F (36.8 C), resp. rate 18, height 5\' 4"  (1.626 m), weight 108.3 kg, SpO2 97 %.  I/O:   Intake/Output Summary (Last 24 hours) at 04/11/2021 1648 Last data filed at 04/11/2021 0900 Gross per 24 hour  Intake 360 ml  Output 1500 ml  Net -1140 ml    PHYSICAL EXAMINATION:  GENERAL:  70 y.o.-year-old patient lying in the bed with no acute distress.  EYES: Pupils equal, round, reactive to light and accommodation. No scleral icterus. HEENT: Head atraumatic, normocephalic. Oropharynx and nasopharynx clear.  LUNGS: Normal breath sounds bilaterally, no wheezing, rales,rhonchi or crepitation. No use of accessory muscles of respiration.  CARDIOVASCULAR: S1, S2 normal. No murmurs, rubs, or gallops.  ABDOMEN: Soft, non-tender, non-distended. Bowel sounds present. No organomegaly or mass.  EXTREMITIES: Trace pedal edema.  Right shoulder better range of motion but still not full range of motion. NEUROLOGIC: Cranial nerves II through XII are intact. Muscle strength 5/5 in all extremities. Sensation intact. Gait not  checked.  PSYCHIATRIC: The patient is alert and oriented x 3.  SKIN: No obvious rash, lesion, or ulcer.   DATA REVIEW:   CBC Recent Labs  Lab 04/10/21 0356  WBC 7.3  HGB 13.4  HCT 39.7  PLT 205    Chemistries  Recent Labs  Lab 04/09/21 0806 04/10/21 0356 04/11/21 0523  NA 137   < > 136  K 3.4*   < > 4.2  CL 103   < > 104  CO2 26   < > 28  GLUCOSE 99   < > 100*  BUN 15   < > 13  CREATININE 0.81   < > 0.91  CALCIUM 9.0   < > 8.8*  MG 1.9  --   --    < > = values in this interval not displayed.     Microbiology Results  Results for orders placed or performed during the hospital encounter of 04/08/21  Resp Panel by RT-PCR (Flu A&B, Covid) Nasopharyngeal Swab     Status: None   Collection Time: 04/08/21  1:11 PM   Specimen: Nasopharyngeal Swab; Nasopharyngeal(NP) swabs in vial transport medium  Result Value Ref Range Status   SARS Coronavirus 2  by RT PCR NEGATIVE NEGATIVE Final    Comment: (NOTE) SARS-CoV-2 target nucleic acids are NOT DETECTED.  The SARS-CoV-2 RNA is generally detectable in upper respiratory specimens during the acute phase of infection. The lowest concentration of SARS-CoV-2 viral copies this assay can detect is 138 copies/mL. A negative result does not preclude SARS-Cov-2 infection and should not be used as the sole basis for treatment or other patient management decisions. A negative result may occur with  improper specimen collection/handling, submission of specimen other than nasopharyngeal swab, presence of viral mutation(s) within the areas targeted by this assay, and inadequate number of viral copies(<138 copies/mL). A negative result must be combined with clinical observations, patient history, and epidemiological information. The expected result is Negative.  Fact Sheet for Patients:  BloggerCourse.com  Fact Sheet for Healthcare Providers:  SeriousBroker.it  This test is no t yet approved or cleared by the [place] FDA and  has been authorized for detection and/or diagnosis of SARS-CoV-2 by FDA under an Emergency Use Authorization (EUA). This EUA will remain  in effect (meaning this test can be used) for the duration of the COVID-19 declaration under Section 564(b)(1) of the Act, 21 U.S.C.section 360bbb-3(b)(1), unless the authorization is terminated  or revoked sooner.       Influenza A by PCR NEGATIVE NEGATIVE Final   Influenza B by PCR NEGATIVE NEGATIVE Final    Comment: (NOTE) The Xpert Xpress SARS-CoV-2/FLU/RSV plus assay is intended as an aid in the diagnosis of influenza from Nasopharyngeal swab specimens and should not be used as a sole basis for treatment. Nasal washings and aspirates are unacceptable for Xpert Xpress SARS-CoV-2/FLU/RSV testing.  Fact Sheet for Patients: BloggerCourse.com  Fact  Sheet for Healthcare Providers: SeriousBroker.it  This test is not yet approved or cleared by the [place] FDA and has been authorized for detection and/or diagnosis of SARS-CoV-2 by FDA under an Emergency Use Authorization (EUA). This EUA will remain in effect (meaning this test can be used) for the duration of the COVID-19 declaration under Section 564(b)(1) of the Act, 21 U.S.C. section 360bbb-3(b)(1), unless the authorization is terminated or revoked.  Performed at [facility], [address], [place], [place] [address]     RADIOLOGY:  [place] Shoulder Right  Result Date: 04/10/2021  CLINICAL DATA:  Several day history of right shoulder pain. EXAM: RIGHT SHOULDER - 2+ VIEW COMPARISON:  None. FINDINGS: No evidence for acute fracture. No shoulder separation or dislocation. Degenerative changes are seen in the acromioclavicular joint. No worrisome lytic or sclerotic osseous abnormality. IMPRESSION: 1. No acute bony abnormality. 2. AC joint degenerative changes. Electronically Signed   By: Kennith Center M.D.   On: 04/10/2021 13:58      Management plans discussed with the patient, and she is in agreement.  CODE STATUS:     Code Status Orders  (From admission, onward)           Start     Ordered   04/08/21 2005  Full code  Continuous        04/08/21 2004           Code Status History     This patient has a current code status but no historical code status.       TOTAL TIME TAKING CARE OF THIS PATIENT: 34 minutes.    Alford Highland M.D on 04/11/2021 at 4:48 PM    [facility]  CC: Primary care physician; Irena Reichmann, DO

## 2021-04-11 NOTE — TOC Transition Note (Signed)
Transition of Care [facility]) - CM/SW Discharge Note   Patient Details  Name: Sarah [name] MRN: [id number] Date of Birth: [birth year]  [facility]) CM/SW Contact:  Gildardo Griffes, LCSW Phone Number: 04/11/2021, 10:20 AM   Clinical Narrative:     Patient to discharge home today. CSW spoke with patient's spouse Sarah [name] who identifies no discharge needs at this time, reports he is at bedside waiting for patient to finish a bath and then he will take her home.   No needs identified, CSW signing off.   Final next level of care: Home/Self Care Barriers to Discharge: No Barriers Identified   Patient Goals and CMS Choice Patient states their goals for this hospitalization and ongoing recovery are:: to go home CMS Medicare.gov Compare Post Acute Care list provided to:: Patient Represenative (must comment) (spouse Sarah [name]) Choice offered to / list presented to : Spouse  Discharge Placement                Patient to be transferred to facility by: spouse Name of family member notified: spoue Sarah [name] Patient and family notified of of transfer: 04/11/21  Discharge Plan and Services In-house Referral: Clinical Social Work   Post Acute Care Choice: Home Health                               Social Determinants of Health (SDOH) Interventions     Readmission Risk Interventions No flowsheet data found.

## 2021-04-17 ENCOUNTER — Ambulatory Visit: Payer: PPO | Attending: Family | Admitting: Family

## 2021-04-17 ENCOUNTER — Encounter: Payer: Self-pay | Admitting: Family

## 2021-04-17 ENCOUNTER — Other Ambulatory Visit: Payer: Self-pay

## 2021-04-17 VITALS — BP 100/59 | HR 69 | Resp 18 | Ht 64.0 in | Wt 238.1 lb

## 2021-04-17 DIAGNOSIS — I5022 Chronic systolic (congestive) heart failure: Secondary | ICD-10-CM

## 2021-04-17 DIAGNOSIS — I10 Essential (primary) hypertension: Secondary | ICD-10-CM

## 2021-04-17 DIAGNOSIS — Z8249 Family history of ischemic heart disease and other diseases of the circulatory system: Secondary | ICD-10-CM | POA: Insufficient documentation

## 2021-04-17 DIAGNOSIS — E119 Type 2 diabetes mellitus without complications: Secondary | ICD-10-CM

## 2021-04-17 DIAGNOSIS — R7309 Other abnormal glucose: Secondary | ICD-10-CM | POA: Diagnosis not present

## 2021-04-17 DIAGNOSIS — R42 Dizziness and giddiness: Secondary | ICD-10-CM | POA: Diagnosis not present

## 2021-04-17 DIAGNOSIS — Z87891 Personal history of nicotine dependence: Secondary | ICD-10-CM | POA: Insufficient documentation

## 2021-04-17 DIAGNOSIS — I5032 Chronic diastolic (congestive) heart failure: Secondary | ICD-10-CM | POA: Insufficient documentation

## 2021-04-17 DIAGNOSIS — R946 Abnormal results of thyroid function studies: Secondary | ICD-10-CM | POA: Diagnosis not present

## 2021-04-17 DIAGNOSIS — E559 Vitamin D deficiency, unspecified: Secondary | ICD-10-CM | POA: Diagnosis not present

## 2021-04-17 DIAGNOSIS — I110 Hypertensive heart disease with heart failure: Secondary | ICD-10-CM | POA: Diagnosis not present

## 2021-04-17 DIAGNOSIS — J449 Chronic obstructive pulmonary disease, unspecified: Secondary | ICD-10-CM | POA: Diagnosis not present

## 2021-04-17 DIAGNOSIS — R5383 Other fatigue: Secondary | ICD-10-CM | POA: Insufficient documentation

## 2021-04-17 NOTE — Patient Instructions (Signed)
Continue weighing daily and call for an overnight weight gain of > 2 pounds or a weekly weight gain of >5 pounds. 

## 2021-04-17 NOTE — Progress Notes (Signed)
Patient ID: Sarah [name], female    DOB: January 18, 1951, 70 y.o.   MRN: [id number]  HPI  Ms Fentress is a 70 y/o female with a history of DM, HTN, COPD, previous tobacco use and chronic heart failure.   Echo report from 04/09/21 reviewed and showed an EF of 45-50% along with mild MR.   Admitted 04/08/21 due to acute on chronic heart failure. Initially given IV lasix with transition to oral diuretics. Discharged after 3 days.   She presents today for her initial visit with a chief complaint of minimal fatigue upon moderate exertion. She describes this as having been present for a few months. She has associate fatigue and occasional light-headedness along with this. She denies any difficulty sleeping, abdominal distention, palpitations, pedal edema, chest pain, cough or weight gain.   Has started walking and now walks 15 minutes daily for exercise.   Is not adding salt to her food except on occasion, when cooking. Using lemon pepper for seasoning and is weighing herself daily.   Past Medical History:  Diagnosis Date   Carotid artery disease (HCC)    Congestive heart failure (HCC)    COPD (chronic obstructive pulmonary disease) (HCC)    Diabetes mellitus without complication (HCC)    pt denies, however is on metformin daily   HBP (high blood pressure)    Heart murmur    Past Surgical History:  Procedure Laterality Date   ABDOMINAL HYSTERECTOMY  1991   CARDIAC CATHETERIZATION  1999   COLONOSCOPY     ESOPHAGEAL DILATION N/A 02/11/2016   Procedure: ESOPHAGEAL DILATION;  Surgeon: Midge Minium, MD;  Location: [facility];  Service: Endoscopy;  Laterality: N/A;   ESOPHAGOGASTRODUODENOSCOPY (EGD) WITH PROPOFOL N/A 02/11/2016   Procedure: ESOPHAGOGASTRODUODENOSCOPY (EGD) WITH PROPOFOL;  Surgeon: Midge Minium, MD;  Location: [facility];  Service: Endoscopy;  Laterality: N/A;  DIABETIC   Family History  Problem Relation Age of Onset   Stroke Father    Hypertension Father     Hypertension Mother    Stroke Mother    Breast cancer Cousin        2 mat cousins   Breast cancer Cousin        pat cousin   Social History   Tobacco Use   Smoking status: Former    Packs/day: 0.50    Years: 27.00    Pack years: 13.50    Types: Cigarettes    Quit date: 01/15/1996    Years since quitting: 25.2   Smokeless tobacco: Never  Substance Use Topics   Alcohol use: Yes    Alcohol/week: 0.0 standard drinks    Comment: WINE WEEKLY   No Known Allergies Prior to Admission medications   Medication Sig Start Date End Date Taking? Authorizing Provider  acetaminophen (TYLENOL) 500 MG tablet Take 500 mg by mouth as needed.   Yes [provider]  Ascorbic Acid (VITAMIN C) 100 MG tablet Take 100 mg by mouth daily.   Yes [provider]  aspirin 81 MG tablet Take 81 mg by mouth daily.   Yes [provider]  carvedilol (COREG) 12.5 MG tablet Take 12.5 mg by mouth 2 (two) times daily with a meal. Reported on 10/08/2015   Yes [provider]  diphenhydramine-acetaminophen (TYLENOL PM) 25-500 MG TABS tablet Take 1 tablet by mouth at bedtime as needed.   Yes [provider]  furosemide (LASIX) 40 MG tablet Take 1 tablet (40 mg total) by mouth 2 (two) times daily. 04/11/21  04/11/22 Yes Wieting, Richard, MD  isosorbide mononitrate (IMDUR) 30 MG 24 hr tablet Take 30 mg by mouth daily. 01/13/18  Yes [provider]  Multiple Vitamins-Minerals (CENTRUM SILVER PO) Take 1 tablet by mouth daily.   Yes [provider]  simvastatin (ZOCOR) 40 MG tablet Take 40 mg by mouth daily at 6 PM.    Yes [provider]  SPIRIVA HANDIHALER 18 MCG inhalation capsule Place 18 mcg into inhaler and inhale daily. 09/09/19  Yes [provider]  spironolactone (ALDACTONE) 25 MG tablet Take 0.5 tablets (12.5 mg total) by mouth daily. 04/12/21  Yes Wieting, Richard, MD  valsartan (DIOVAN) 80 MG tablet Take 1 tablet by mouth daily.   Yes  [provider]  Glucosamine-Chondroitin (OSTEO BI-FLEX REGULAR STRENGTH PO) Take 1 tablet by mouth daily. Patient not taking: Reported on 04/17/2021    [provider]    Review of Systems  Constitutional:  Positive for fatigue. Negative for appetite change.  HENT:  Negative for congestion, postnasal drip and sore throat.   Eyes: Negative.   Respiratory:  Positive for shortness of breath. Negative for cough and chest tightness.   Cardiovascular:  Negative for chest pain, palpitations and leg swelling.  Gastrointestinal:  Negative for abdominal distention and abdominal pain.  Endocrine: Negative.   Genitourinary: Negative.   Musculoskeletal:  Negative for back pain and neck pain.  Skin: Negative.   Allergic/Immunologic: Negative.   Neurological:  Positive for light-headedness ("sometimes but not often"). Negative for dizziness.  Hematological:  Negative for adenopathy. Does not bruise/bleed easily.  Psychiatric/Behavioral:  Negative for dysphoric mood and sleep disturbance (sleeping on 1 pillow). The patient is not nervous/anxious.    Vitals:   04/17/21 1337  BP: (!) 100/59  Pulse: 69  Resp: 18  SpO2: 98%  Weight: 238 lb 2 oz (108 kg)  Height: 5\' 4"  (1.626 m)   Wt Readings from Last 3 Encounters:  04/17/21 238 lb 2 oz (108 kg)  04/11/21 238 lb 12.8 oz (108.3 kg)  10/10/19 233 lb 12.8 oz (106.1 kg)   Lab Results  Component Value Date   CREATININE 0.91 04/11/2021   CREATININE 0.77 04/10/2021   CREATININE 0.81 04/09/2021   Physical Exam Vitals and nursing note reviewed.  Constitutional:      Appearance: Normal appearance.  HENT:     Head: Normocephalic and atraumatic.  Cardiovascular:     Rate and Rhythm: Normal rate and regular rhythm.  Pulmonary:     Effort: Pulmonary effort is normal. No respiratory distress.     Breath sounds: No wheezing or rales.  Abdominal:     General: There is no distension.     Palpations: Abdomen is soft.   Musculoskeletal:        General: No tenderness.     Cervical back: Normal range of motion and neck supple.     Right lower leg: No edema.     Left lower leg: No edema.  Skin:    General: Skin is warm and dry.  Neurological:     General: No focal deficit present.     Mental Status: She is alert and oriented to person, place, and time.  Psychiatric:        Mood and Affect: Mood normal.        Behavior: Behavior normal.        Thought Content: Thought content normal.   Assessment & Plan:  1: Chronic heart failure with reduced ejection fraction- - NYHA class II -  euvolemic today - weighing daily and home weight is 235 pounds; reminded to call for an overnight weight gain of > 2 pounds or a weekly weight gain of > 5 pounds - not adding salt and is reading food labels for sodium content as well as serving size; low sodium cookbook provided - saw cardiology Mariana Kaufman) 03/07/21; returns in 6 months - on GDMT of carvedilol, valsartan and spironolactone - unsure if BP could tolerate switching valsartan to entresto - consider adding SGLT2 - was going to check labs today but she already has orders for labs from her PCP & she's going to get those drawn today - BNP 04/08/21 was 223.9 - reports receiving her flu vaccine for this season  2: HTN- - BP looks good (100/59) - saw PCP Thomasena Edis) 01/30/21 - BMP 04/11/21 reviewed and showed sodium 136, potassium 4.2, creatinine 0.91 & GFR >60  3: COPD- - using spiriva - sees pulmonology Jayme Cloud) 05/08/21  4: DM- - A1c 04/09/21 was 6.0% - currently diet controlled.   Patient did not bring her medications nor a list. Each medication was verbally reviewed with the patient and she was encouraged to bring the bottles to every visit to confirm accuracy of list.   Return in 6 weeks or sooner for any questions/problems before then.

## 2021-04-19 DIAGNOSIS — I2584 Coronary atherosclerosis due to calcified coronary lesion: Secondary | ICD-10-CM | POA: Diagnosis not present

## 2021-04-19 DIAGNOSIS — I10 Essential (primary) hypertension: Secondary | ICD-10-CM | POA: Diagnosis not present

## 2021-04-19 DIAGNOSIS — I5032 Chronic diastolic (congestive) heart failure: Secondary | ICD-10-CM | POA: Diagnosis not present

## 2021-04-19 DIAGNOSIS — E782 Mixed hyperlipidemia: Secondary | ICD-10-CM | POA: Diagnosis not present

## 2021-04-19 DIAGNOSIS — I2510 Atherosclerotic heart disease of native coronary artery without angina pectoris: Secondary | ICD-10-CM | POA: Diagnosis not present

## 2021-04-19 DIAGNOSIS — E119 Type 2 diabetes mellitus without complications: Secondary | ICD-10-CM | POA: Diagnosis not present

## 2021-05-02 DIAGNOSIS — R7309 Other abnormal glucose: Secondary | ICD-10-CM | POA: Diagnosis not present

## 2021-05-02 DIAGNOSIS — Z09 Encounter for follow-up examination after completed treatment for conditions other than malignant neoplasm: Secondary | ICD-10-CM | POA: Diagnosis not present

## 2021-05-02 DIAGNOSIS — I10 Essential (primary) hypertension: Secondary | ICD-10-CM | POA: Diagnosis not present

## 2021-05-02 DIAGNOSIS — E7800 Pure hypercholesterolemia, unspecified: Secondary | ICD-10-CM | POA: Diagnosis not present

## 2021-05-02 DIAGNOSIS — J449 Chronic obstructive pulmonary disease, unspecified: Secondary | ICD-10-CM | POA: Diagnosis not present

## 2021-05-02 DIAGNOSIS — I5030 Unspecified diastolic (congestive) heart failure: Secondary | ICD-10-CM | POA: Diagnosis not present

## 2021-05-02 DIAGNOSIS — I2510 Atherosclerotic heart disease of native coronary artery without angina pectoris: Secondary | ICD-10-CM | POA: Diagnosis not present

## 2021-05-02 DIAGNOSIS — E559 Vitamin D deficiency, unspecified: Secondary | ICD-10-CM | POA: Diagnosis not present

## 2021-05-08 ENCOUNTER — Ambulatory Visit: Payer: PPO | Admitting: Pulmonary Disease

## 2021-05-08 ENCOUNTER — Encounter: Payer: Self-pay | Admitting: Pulmonary Disease

## 2021-05-08 ENCOUNTER — Other Ambulatory Visit: Payer: Self-pay

## 2021-05-08 ENCOUNTER — Ambulatory Visit
Admission: RE | Admit: 2021-05-08 | Discharge: 2021-05-08 | Disposition: A | Discharge status: Home / Self Care | Payer: PPO | Source: Ambulatory Visit | Attending: Pulmonary Disease | Admitting: Pulmonary Disease

## 2021-05-08 VITALS — BP 102/70 | HR 67 | Temp 97.0°F | Ht 63.0 in | Wt 236.2 lb

## 2021-05-08 DIAGNOSIS — J449 Chronic obstructive pulmonary disease, unspecified: Secondary | ICD-10-CM

## 2021-05-08 DIAGNOSIS — I5032 Chronic diastolic (congestive) heart failure: Secondary | ICD-10-CM

## 2021-05-08 DIAGNOSIS — Z23 Encounter for immunization: Secondary | ICD-10-CM

## 2021-05-08 MED ORDER — SPIRIVA RESPIMAT 2.5 MCG/ACT IN AERS: 2.0000 | INHALATION_SPRAY | Freq: Every day | RESPIRATORY_TRACT | 0 refills | Status: DC

## 2021-05-08 NOTE — Patient Instructions (Addendum)
We will schedule breathing test after the first of the year.  We will get a chest x-ray now to reevaluate and follow-up from your x-ray in October.  We will see you in follow-up in 3 to 4 months time call sooner should any problems arise.

## 2021-05-08 NOTE — Progress Notes (Signed)
Subjective:    Patient ID: Sarah [name], female    DOB: 1951/06/05, 70 y.o.   MRN: [id number] Chief Complaint  Patient presents with   Follow-up    copd    HPI Patient is a 70 year old former smoker who follows here for the issue of mild to moderate COPD with dyspnea.  This is a scheduled visit.  Last visit was on 10 October 2019.  Previously she had had a trial of Anoro Ellipta which she has not tolerated due to tachypalpitations.  She has been on Spiriva HandiHaler which has helped her.  Only issue is that she is "in the donut hole" and unable to afford it.  She is running out of her last prescription.  She uses albuterol only rarely.  Has not needed any in at least 3 to 4 months.  She previously followed with Dr. Nicholos Johns who left the practice in 2019.  Any chest pain, fevers, chills or sweats.  No orthopnea or paroxysmal nocturnal dyspnea.  No lower extremity edema or calf tenderness.  No GERD symptoms.  She was admitted to [facility] 08 April 2021 through 11 April 2021.  She had acute respiratory failure with hypoxia due to acute on chronic diastolic heart failure.  Her respiratory failure resolved with management of her diastolic failure.  Her diuretic doses were adjusted she follows with the heart failure clinic.  DATA: 04/29/2016 PFTs: FEV1 1.59 L or 82% predicted, FVC 2.39 L or 98% predicted, FEV1/FVC 67%, diffusion capacity 70%.  Consistent with mild obstruction, concomitant restriction likely due to obesity. 03/17/2019 CT chest: No parenchymal abnormalities, specifically no lung nodule.  No emphysema. 04/09/2021 echocardiogram: LVEF 45 to 50% with mild decreased function in the LV.  No wall motion abnormalities.  Diastolic dysfunction grade 1.  Mild mitral regurgitation.  Review of Systems A 10 point review of systems was performed and it is as noted above otherwise negative.  Past Medical History:  Diagnosis Date   Carotid artery disease (HCC)    Congestive heart failure  (HCC)    COPD (chronic obstructive pulmonary disease) (HCC)    Diabetes mellitus without complication (HCC)    pt denies, however is on metformin daily   HBP (high blood pressure)    Heart murmur    Past Surgical History:  Procedure Laterality Date   ABDOMINAL HYSTERECTOMY  1991   CARDIAC CATHETERIZATION  1999   COLONOSCOPY     ESOPHAGEAL DILATION N/A 02/11/2016   Procedure: ESOPHAGEAL DILATION;  Surgeon: Midge Minium, MD;  Location: [facility];  Service: Endoscopy;  Laterality: N/A;   ESOPHAGOGASTRODUODENOSCOPY (EGD) WITH PROPOFOL N/A 02/11/2016   Procedure: ESOPHAGOGASTRODUODENOSCOPY (EGD) WITH PROPOFOL;  Surgeon: Midge Minium, MD;  Location: [facility];  Service: Endoscopy;  Laterality: N/A;  DIABETIC   Family History  Problem Relation Age of Onset   Stroke Father    Hypertension Father    Hypertension Mother    Stroke Mother    Breast cancer Cousin        2 mat cousins   Breast cancer Cousin        pat cousin   Social History   Tobacco Use   Smoking status: Former    Packs/day: 0.50    Years: 27.00    Pack years: 13.50    Types: Cigarettes    Quit date: 01/15/1996    Years since quitting: 25.3   Smokeless tobacco: Never  Substance Use Topics   Alcohol use: Yes    Alcohol/week: 0.0 standard  drinks    Comment: WINE WEEKLY   No Known Allergies  Current Meds  Medication Sig   acetaminophen (TYLENOL) 500 MG tablet Take 500 mg by mouth as needed.   Ascorbic Acid (VITAMIN C) 100 MG tablet Take 100 mg by mouth daily.   aspirin 81 MG tablet Take 81 mg by mouth daily.   Calcium Carbonate-Vit D-Min (CALTRATE 600+D PLUS MINERALS) 600-800 MG-UNIT TABS Take by mouth.   carvedilol (COREG) 12.5 MG tablet Take 12.5 mg by mouth 2 (two) times daily with a meal. Reported on 10/08/2015   diphenhydramine-acetaminophen (TYLENOL PM) 25-500 MG TABS tablet Take 1 tablet by mouth at bedtime as needed.   furosemide (LASIX) 40 MG tablet Take 1 tablet (40 mg total) by mouth 2  (two) times daily.   isosorbide mononitrate (IMDUR) 30 MG 24 hr tablet Take 30 mg by mouth daily.   MAGNESIUM PO Take by mouth.   Multiple Vitamins-Minerals (CENTRUM SILVER PO) Take 1 tablet by mouth daily.   simvastatin (ZOCOR) 40 MG tablet Take 40 mg by mouth daily at 6 PM.    SPIRIVA HANDIHALER 18 MCG inhalation capsule Place 18 mcg into inhaler and inhale daily.   spironolactone (ALDACTONE) 25 MG tablet Take 0.5 tablets (12.5 mg total) by mouth daily.   valsartan (DIOVAN) 80 MG tablet Take 1 tablet by mouth daily.   [DISCONTINUED] Glucosamine-Chondroitin (OSTEO BI-FLEX REGULAR STRENGTH PO) Take 1 tablet by mouth daily.       Objective:   Physical Exam BP 102/70 (BP Location: Left Arm, Patient Position: Sitting, Cuff Size: Normal)   Pulse 67   Temp (!) 97 F (36.1 C) (Oral)   Ht 5\' 3"  (1.6 m)   Wt 236 lb 3.2 oz (107.1 kg)   SpO2 99%   BMI 41.84 kg/m    General Appearance: Obese woman, well developed, no respiratory distress. Neuro:without focal findings, mental status, speech normal, alert and oriented, cooperative HEENT: PERRLA, no scleral icterus, nose/mouth/throat not examined due to masking requirements for COVID 19. Pulmonary: No wheezing, No rales, good air entry.  Coarse breath sounds in general. CardiovascularNormal S1,S2.  No m/r/g.  Abdomen: Benign, protuberant, nondistended Renal:  No costovertebral tenderness  Endoc: No evident thyromegaly, no signs of acromegaly or Cushing features Skin:   warm, no rashes, no ecchymosis  Extremities: normal, no cyanosis, clubbing.    No edema noted.  Chest x-ray performed 08 April 2021, independently reviewed, appearance is that of multifocal airspace disease cannot exclude pulmonary edema:     Assessment & Plan:     ICD-10-CM   1. Chronic obstructive pulmonary disease, unspecified COPD type (HCC)  J44.9 Pulmonary Function Test [facility] Only    DG Chest 2 View   She appears to be well compensated at present Will need PFTs     2. Chronic diastolic CHF (congestive heart failure), NYHA class 2 (HCC)  I50.32    Evidence of pulmonary edema on October chest x-ray Repeat chest x-ray to ensure resolution    3. Need for immunization against influenza  Z23 Flu Vaccine QUAD High Dose(Fluad)     Orders Placed This Encounter  Procedures   DG Chest 2 View    Standing Status:   Future    Number of Occurrences:   1    Standing Expiration Date:   05/08/2022    Order Specific Question:   Reason for Exam (SYMPTOM  OR DIAGNOSIS REQUIRED)    Answer:   COPD    Order Specific Question:  Preferred imaging location?    Answer:   [facility]   Flu Vaccine QUAD High Dose(Fluad)   Pulmonary Function Test [facility] Only    Standing Status:   Future    Number of Occurrences:   1    Standing Expiration Date:   05/08/2022    Scheduling Instructions:     SCHEDULE BEGINNING OF JAN    Order Specific Question:   Full PFT: includes the following: basic spirometry, spirometry pre & post bronchodilator, diffusion capacity (DLCO), lung volumes    Answer:   Full PFT    Order Specific Question:   This test can only be performed at    Answer:   [facility]   Meds ordered this encounter  Medications   Tiotropium Bromide Monohydrate (SPIRIVA RESPIMAT) 2.5 MCG/ACT AERS    Sig: Inhale 2 puffs into the lungs daily.    Dispense:  4 g    Refill:  0    Order Specific Question:   Lot Number?    Answer:   [id number]    Order Specific Question:   Expiration Date?    Answer:   12/13/2021    Order Specific Question:   NDC    Answer:   [id number] [[id number]]    Order Specific Question:   Quantity    Answer:   2   Will see the patient in follow-up in 3 months time call sooner should any new problems arise.   Gailen Shelter, MD Advanced Bronchoscopy PCCM [facility]    *This note was dictated using voice recognition software/Dragon.  Despite best efforts to proofread, errors can occur which can change the meaning. Any  transcriptional errors that result from this process are unintentional and may not be fully corrected at the time of dictation.

## 2021-05-14 ENCOUNTER — Telehealth: Payer: Self-pay | Admitting: Pulmonary Disease

## 2021-05-14 NOTE — Telephone Encounter (Signed)
Sarah Saner, MD  05/13/2021 11:09 AM EST     Marked improvement on her x-ray from prior.  X-ray is now clear.    Patient is aware of results and voiced her understanding.  Nothing further needed.

## 2021-05-26 NOTE — Progress Notes (Deleted)
Patient ID: Sarah [name], female    DOB: March 07, 1951, 70 y.o.   MRN: [id number]  HPI  Ms Belknap is a 70 y/o female with a history of DM, HTN, COPD, previous tobacco use and chronic heart failure.   Echo report from 04/09/21 reviewed and showed an EF of 45-50% along with mild MR.   Admitted 04/08/21 due to acute on chronic heart failure. Initially given IV lasix with transition to oral diuretics. Discharged after 3 days.   She presents today for a follow-up visit with a chief complaint of   Past Medical History:  Diagnosis Date   Carotid artery disease (HCC)    Congestive heart failure (HCC)    COPD (chronic obstructive pulmonary disease) (HCC)    Diabetes mellitus without complication (HCC)    pt denies, however is on metformin daily   HBP (high blood pressure)    Heart murmur    Past Surgical History:  Procedure Laterality Date   ABDOMINAL HYSTERECTOMY  1991   CARDIAC CATHETERIZATION  1999   COLONOSCOPY     ESOPHAGEAL DILATION N/A 02/11/2016   Procedure: ESOPHAGEAL DILATION;  Surgeon: Midge Minium, MD;  Location: [facility];  Service: Endoscopy;  Laterality: N/A;   ESOPHAGOGASTRODUODENOSCOPY (EGD) WITH PROPOFOL N/A 02/11/2016   Procedure: ESOPHAGOGASTRODUODENOSCOPY (EGD) WITH PROPOFOL;  Surgeon: Midge Minium, MD;  Location: [facility];  Service: Endoscopy;  Laterality: N/A;  DIABETIC   Family History  Problem Relation Age of Onset   Stroke Father    Hypertension Father    Hypertension Mother    Stroke Mother    Breast cancer Cousin        2 mat cousins   Breast cancer Cousin        pat cousin   Social History   Tobacco Use   Smoking status: Former    Packs/day: 0.50    Years: 27.00    Pack years: 13.50    Types: Cigarettes    Quit date: 01/15/1996    Years since quitting: 25.3   Smokeless tobacco: Never  Substance Use Topics   Alcohol use: Yes    Alcohol/week: 0.0 standard drinks    Comment: WINE WEEKLY   No Known Allergies   Review of  Systems  Constitutional:  Positive for fatigue. Negative for appetite change.  HENT:  Negative for congestion, postnasal drip and sore throat.   Eyes: Negative.   Respiratory:  Positive for shortness of breath. Negative for cough and chest tightness.   Cardiovascular:  Negative for chest pain, palpitations and leg swelling.  Gastrointestinal:  Negative for abdominal distention and abdominal pain.  Endocrine: Negative.   Genitourinary: Negative.   Musculoskeletal:  Negative for back pain and neck pain.  Skin: Negative.   Allergic/Immunologic: Negative.   Neurological:  Positive for light-headedness ("sometimes but not often"). Negative for dizziness.  Hematological:  Negative for adenopathy. Does not bruise/bleed easily.  Psychiatric/Behavioral:  Negative for dysphoric mood and sleep disturbance (sleeping on 1 pillow). The patient is not nervous/anxious.      Physical Exam Vitals and nursing note reviewed.  Constitutional:      Appearance: Normal appearance.  HENT:     Head: Normocephalic and atraumatic.  Cardiovascular:     Rate and Rhythm: Normal rate and regular rhythm.  Pulmonary:     Effort: Pulmonary effort is normal. No respiratory distress.     Breath sounds: No wheezing or rales.  Abdominal:     General: There is no distension.  Palpations: Abdomen is soft.  Musculoskeletal:        General: No tenderness.     Cervical back: Normal range of motion and neck supple.     Right lower leg: No edema.     Left lower leg: No edema.  Skin:    General: Skin is warm and dry.  Neurological:     General: No focal deficit present.     Mental Status: She is alert and oriented to person, place, and time.  Psychiatric:        Mood and Affect: Mood normal.        Behavior: Behavior normal.        Thought Content: Thought content normal.   Assessment & Plan:  1: Chronic heart failure with reduced ejection fraction- - NYHA class II - euvolemic today - weighing daily;  reminded to call for an overnight weight gain of > 2 pounds or a weekly weight gain of > 5 pounds - weight 238.2 from last 6 weeks ago - not adding salt and is reading food labels for sodium content as well as serving size - saw cardiology Gwen Pounds) 04/19/21 - on GDMT of carvedilol, valsartan and spironolactone - unsure if BP could tolerate switching valsartan to entresto - consider adding SGLT2 - BNP 04/08/21 was 223.9 - reports receiving her flu vaccine for this season  2: HTN- - BP  - saw PCP Thomasena Edis) 01/30/21 - BMP 04/11/21 reviewed and showed sodium 136, potassium 4.2, creatinine 0.91 & GFR >60  3: COPD- - using spiriva - saw pulmonology Jayme Cloud) 05/08/21  4: DM- - A1c 04/09/21 was 6.0% - currently diet controlled.   Patient did not bring her medications nor a list. Each medication was verbally reviewed with the patient and she was encouraged to bring the bottles to every visit to confirm accuracy of list.

## 2021-05-28 ENCOUNTER — Ambulatory Visit: Payer: PPO | Admitting: Family

## 2021-06-18 ENCOUNTER — Encounter: Payer: Self-pay | Admitting: Family

## 2021-06-18 ENCOUNTER — Ambulatory Visit: Payer: PPO | Attending: Family | Admitting: Family

## 2021-06-18 ENCOUNTER — Other Ambulatory Visit: Payer: Self-pay

## 2021-06-18 VITALS — BP 110/64 | HR 63 | Resp 20 | Ht 63.0 in | Wt 232.2 lb

## 2021-06-18 DIAGNOSIS — I110 Hypertensive heart disease with heart failure: Secondary | ICD-10-CM | POA: Insufficient documentation

## 2021-06-18 DIAGNOSIS — J449 Chronic obstructive pulmonary disease, unspecified: Secondary | ICD-10-CM

## 2021-06-18 DIAGNOSIS — I5022 Chronic systolic (congestive) heart failure: Secondary | ICD-10-CM

## 2021-06-18 DIAGNOSIS — I10 Essential (primary) hypertension: Secondary | ICD-10-CM

## 2021-06-18 DIAGNOSIS — I340 Nonrheumatic mitral (valve) insufficiency: Secondary | ICD-10-CM | POA: Diagnosis not present

## 2021-06-18 DIAGNOSIS — Z951 Presence of aortocoronary bypass graft: Secondary | ICD-10-CM | POA: Insufficient documentation

## 2021-06-18 DIAGNOSIS — E119 Type 2 diabetes mellitus without complications: Secondary | ICD-10-CM

## 2021-06-18 DIAGNOSIS — Z79899 Other long term (current) drug therapy: Secondary | ICD-10-CM | POA: Diagnosis not present

## 2021-06-18 MED ORDER — EMPAGLIFLOZIN 10 MG PO TABS: 10.0000 mg | ORAL_TABLET | Freq: Every day | ORAL | 5 refills | Status: DC

## 2021-06-18 NOTE — Progress Notes (Signed)
Patient ID: Sarah [name], female    DOB: 04-13-1951, 71 y.o.   MRN: [id number]  HPI  Ms Vandenheuvel is a 71 y/o female with a history of DM, HTN, COPD, previous tobacco use and chronic heart failure.   Echo report from 04/09/21 reviewed and showed an EF of 45-50% along with mild MR.   Admitted 04/08/21 due to acute on chronic heart failure. Initially given IV lasix with transition to oral diuretics. Discharged after 3 days.   She presents today for a follow-up visit with a chief complaint of minimal fatigue upon moderate exertion. She describes this as chronic in nature having been present for several years. She denies any difficulty sleeping, dizziness, abdominal distention, palpitations, pedal edema, chest pain, shortness of breath, cough or weight gain.   Overall is feeling quite well and is now walking daily for ~ 10-15 minutes each day.   Past Medical History:  Diagnosis Date   Carotid artery disease (HCC)    Congestive heart failure (HCC)    COPD (chronic obstructive pulmonary disease) (HCC)    Diabetes mellitus without complication (HCC)    pt denies, however is on metformin daily   HBP (high blood pressure)    Heart murmur    Past Surgical History:  Procedure Laterality Date   ABDOMINAL HYSTERECTOMY  1991   CARDIAC CATHETERIZATION  1999   COLONOSCOPY     ESOPHAGEAL DILATION N/A 02/11/2016   Procedure: ESOPHAGEAL DILATION;  Surgeon: Midge Minium, MD;  Location: [facility];  Service: Endoscopy;  Laterality: N/A;   ESOPHAGOGASTRODUODENOSCOPY (EGD) WITH PROPOFOL N/A 02/11/2016   Procedure: ESOPHAGOGASTRODUODENOSCOPY (EGD) WITH PROPOFOL;  Surgeon: Midge Minium, MD;  Location: [facility];  Service: Endoscopy;  Laterality: N/A;  DIABETIC   Family History  Problem Relation Age of Onset   Stroke Father    Hypertension Father    Hypertension Mother    Stroke Mother    Breast cancer Cousin        2 mat cousins   Breast cancer Cousin        pat cousin   Social  History   Tobacco Use   Smoking status: Former    Packs/day: 0.50    Years: 27.00    Pack years: 13.50    Types: Cigarettes    Quit date: 01/15/1996    Years since quitting: 25.4   Smokeless tobacco: Never  Substance Use Topics   Alcohol use: Yes    Alcohol/week: 0.0 standard drinks    Comment: WINE WEEKLY   No Known Allergies  Prior to Admission medications   Medication Sig Start Date End Date Taking? Authorizing Provider  acetaminophen (TYLENOL) 500 MG tablet Take 500 mg by mouth as needed.   Yes [provider]  Ascorbic Acid (VITAMIN C) 100 MG tablet Take 100 mg by mouth daily.   Yes [provider]  aspirin 81 MG tablet Take 81 mg by mouth daily.   Yes [provider]  Calcium Carbonate-Vit D-Min (CALTRATE 600+D PLUS MINERALS) 600-800 MG-UNIT TABS Take 1 tablet by mouth daily.   Yes [provider]  carvedilol (COREG) 12.5 MG tablet Take 12.5 mg by mouth 2 (two) times daily with a meal. Reported on 10/08/2015   Yes [provider]  diphenhydramine-acetaminophen (TYLENOL PM) 25-500 MG TABS tablet Take 1 tablet by mouth at bedtime as needed (sleep).   Yes [provider]  furosemide (LASIX) 40 MG tablet Take 1 tablet (40 mg total) by mouth 2 (two) times  daily. Patient taking differently: Take 40 mg by mouth daily. 04/11/21 04/11/22 Yes Wieting, Richard, MD  isosorbide mononitrate (IMDUR) 30 MG 24 hr tablet Take 30 mg by mouth daily. 01/13/18  Yes [provider]  MAGNESIUM PO Take 200 mg by mouth daily.   Yes [provider]  Multiple Vitamins-Minerals (CENTRUM SILVER PO) Take 1 tablet by mouth daily.   Yes [provider]  simvastatin (ZOCOR) 40 MG tablet Take 40 mg by mouth daily at 6 PM.    Yes [provider]  SPIRIVA HANDIHALER 18 MCG inhalation capsule Place 18 mcg into inhaler and inhale daily. 09/09/19  Yes [provider]  spironolactone (ALDACTONE) 25 MG tablet Take 0.5 tablets  (12.5 mg total) by mouth daily. 04/12/21  Yes Wieting, Richard, MD  valsartan (DIOVAN) 80 MG tablet Take 1 tablet by mouth daily.   Yes [provider]  Tiotropium Bromide Monohydrate (SPIRIVA RESPIMAT) 2.5 MCG/ACT AERS Inhale 2 puffs into the lungs daily. 05/08/21   Salena Saner, MD    Review of Systems  Constitutional:  Positive for fatigue. Negative for appetite change.  HENT:  Negative for congestion, postnasal drip and sore throat.   Eyes: Negative.   Respiratory:  Negative for cough, chest tightness and shortness of breath.   Cardiovascular:  Negative for chest pain, palpitations and leg swelling.  Gastrointestinal:  Negative for abdominal distention and abdominal pain.  Endocrine: Negative.   Genitourinary: Negative.   Musculoskeletal:  Negative for back pain and neck pain.  Skin: Negative.   Allergic/Immunologic: Negative.   Neurological:  Negative for dizziness and light-headedness.  Hematological:  Negative for adenopathy. Does not bruise/bleed easily.  Psychiatric/Behavioral:  Negative for dysphoric mood and sleep disturbance (sleeping on 1 pillow). The patient is not nervous/anxious.    Vitals:   06/18/21 1044  BP: 110/64  Pulse: 63  Resp: 20  SpO2: 100%  Weight: 232 lb 4 oz (105.3 kg)  Height: 5\' 3"  (1.6 m)   Wt Readings from Last 3 Encounters:  06/18/21 232 lb 4 oz (105.3 kg)  05/08/21 236 lb 3.2 oz (107.1 kg)  04/17/21 238 lb 2 oz (108 kg)   Lab Results  Component Value Date   CREATININE 0.91 04/11/2021   CREATININE 0.77 04/10/2021   CREATININE 0.81 04/09/2021   Physical Exam Vitals and nursing note reviewed.  Constitutional:      Appearance: Normal appearance.  HENT:     Head: Normocephalic and atraumatic.  Cardiovascular:     Rate and Rhythm: Normal rate and regular rhythm.  Pulmonary:     Effort: Pulmonary effort is normal. No respiratory distress.     Breath sounds: No wheezing or rales.  Abdominal:     General: There is no  distension.     Palpations: Abdomen is soft.  Musculoskeletal:        General: No tenderness.     Cervical back: Normal range of motion and neck supple.     Right lower leg: No edema.     Left lower leg: No edema.  Skin:    General: Skin is warm and dry.  Neurological:     General: No focal deficit present.     Mental Status: She is alert and oriented to person, place, and time.  Psychiatric:        Mood and Affect: Mood normal.        Behavior: Behavior normal.        Thought Content: Thought content normal.  Assessment & Plan:  1: Chronic heart failure with reduced ejection fraction- - NYHA class II - euvolemic today - weighing daily; reminded to call for an overnight weight gain of > 2 pounds or a weekly weight gain of > 5 pounds - weight down 6 pounds from last 6 weeks ago - walking daily for ~ 10-15 minutes each day - not adding salt and is reading food labels for sodium content as well as serving size - saw cardiology Gwen Pounds([place]) 04/19/21 - on GDMT of carvedilol, valsartan and spironolactone - unsure if BP could tolerate switching valsartan to entresto - will add jardiance 10mg  daily; 30 day voucher given - will check BMP at next visit - BNP 04/08/21 was 223.9 - reports receiving her flu vaccine for this season  2: HTN- - BP looks good (110/64) - saw PCP Thomasena Edis([place]) 01/30/21 - BMP 04/11/21 reviewed and showed sodium 136, potassium 4.2, creatinine 0.91 & GFR >60  3: COPD- - using spiriva - saw pulmonology Jayme Cloud([place]) 05/08/21  4: DM- - A1c 04/09/21 was 6.0% - currently diet controlled.   Medication bottles reviewed.   Return in 1 month or sooner for any questions/problems before then.

## 2021-06-18 NOTE — Progress Notes (Signed)
[facility] - PHARMACIST COUNSELING NOTE  Guideline-Directed Medical Therapy/Evidence Based Medicine  ACE/ARB/ARNI: Valsartan 80 mg daily Beta Blocker: Carvedilol 12.5 mg twice daily Aldosterone Antagonist: Spironolactone 12.5 mg daily Diuretic: Furosemide 40 mg daily SGLT2i: New start Empagliflozin 10 mg daily on 06/18/2020  Adherence Assessment  Do you ever forget to take your medication? [] Yes [x] No  Do you ever skip doses due to side effects? [] Yes [x] No  Do you have trouble affording your medicines? [] Yes [x] No  Are you ever unable to pick up your medication due to transportation difficulties? [] Yes [x] No  Do you ever stop taking your medications because you don't believe they are helping? [] Yes [x] No  Do you check your weight daily? [x] Yes [] No   Adherence strategy: Patients states she has a system in place, but sometimes forgets to take the PM medications. Overall no issues with adherence.   Barriers to obtaining medications: none stated by the patient.   Vital signs: HR 63, BP 110/64, weight (pounds) 232 ECHO: Date 03/2021, EF 45-50,   BMP Latest Ref Rng & Units 04/11/2021 04/10/2021 04/09/2021  Glucose 70 - 99 mg/dL 100(H) 102(H) 99  BUN 8 - 23 mg/dL 13 16 15   Creatinine 0.44 - 1.00 mg/dL 0.91 0.77 0.81  Sodium 135 - 145 mmol/L 136 138 137  Potassium 3.5 - 5.1 mmol/L 4.2 3.7 3.4(L)  Chloride 98 - 111 mmol/L 104 105 103  CO2 22 - 32 mmol/L 28 27 26   Calcium 8.9 - 10.3 mg/dL 8.8(L) 9.1 9.0    Past Medical History:  Diagnosis Date   Carotid artery disease (HCC)    Congestive heart failure (HCC)    COPD (chronic obstructive pulmonary disease) ([place])    Diabetes mellitus without complication ([place])    pt denies, however is on metformin daily   HBP (high blood pressure)    Heart murmur     ASSESSMENT 71 year old female who presents to the HF clinic for follow up. No complaints today. No questions or side effects with  medications. Pt takes tylenol PM to help with sleep. I recommended to limit the amount of diphenhydramine she uses due to long term issues with diphenhydramine. I recommended to try melatonin. No issues obtaining medications and states she takes her medications regularly overall. Pt states she has had a lot of improvement in the last couple of months.   PLAN HFmrEF  - continue current medications  - plan to start jardiance today.  - continue to assess if patient can tolerate entresto in the future.     Time spent: 15 minutes  Oswald Hillock, Pharm.D. Clinical Pharmacist 06/18/2021 11:38 AM    Current Outpatient Medications:    acetaminophen (TYLENOL) 500 MG tablet, Take 500 mg by mouth as needed., Disp: , Rfl:    Ascorbic Acid (VITAMIN C) 100 MG tablet, Take 100 mg by mouth daily., Disp: , Rfl:    aspirin 81 MG tablet, Take 81 mg by mouth daily., Disp: , Rfl:    Calcium Carbonate-Vit D-Min (CALTRATE 600+D PLUS MINERALS) 600-800 MG-UNIT TABS, Take 1 tablet by mouth daily., Disp: , Rfl:    carvedilol (COREG) 12.5 MG tablet, Take 12.5 mg by mouth 2 (two) times daily with a meal. Reported on 10/08/2015, Disp: , Rfl:    diphenhydramine-acetaminophen (TYLENOL PM) 25-500 MG TABS tablet, Take 1 tablet by mouth at bedtime as needed (sleep)., Disp: , Rfl:    empagliflozin (JARDIANCE) 10 MG TABS tablet, Take 1 tablet (10  mg total) by mouth daily before breakfast., Disp: 30 tablet, Rfl: 5   furosemide (LASIX) 40 MG tablet, Take 1 tablet (40 mg total) by mouth 2 (two) times daily. (Patient taking differently: Take 40 mg by mouth daily.), Disp: 60 tablet, Rfl: 0   isosorbide mononitrate (IMDUR) 30 MG 24 hr tablet, Take 30 mg by mouth daily., Disp: , Rfl: 1   MAGNESIUM PO, Take 200 mg by mouth daily., Disp: , Rfl:    Multiple Vitamins-Minerals (CENTRUM SILVER PO), Take 1 tablet by mouth daily., Disp: , Rfl:    simvastatin (ZOCOR) 40 MG tablet, Take 40 mg by mouth daily at 6 PM. , Disp: , Rfl:    SPIRIVA  HANDIHALER 18 MCG inhalation capsule, Place 18 mcg into inhaler and inhale daily., Disp: , Rfl:    spironolactone (ALDACTONE) 25 MG tablet, Take 0.5 tablets (12.5 mg total) by mouth daily., Disp: 30 tablet, Rfl: 0   Tiotropium Bromide Monohydrate (SPIRIVA RESPIMAT) 2.5 MCG/ACT AERS, Inhale 2 puffs into the lungs daily., Disp: 4 g, Rfl: 0   valsartan (DIOVAN) 80 MG tablet, Take 1 tablet by mouth daily., Disp: , Rfl:    DRUGS TO CAUTION IN HEART FAILURE  Drug or Class Mechanism  Analgesics NSAIDs COX-2 inhibitors Glucocorticoids  Sodium and water retention, increased systemic vascular resistance, decreased response to diuretics   Diabetes Medications Metformin Thiazolidinediones Rosiglitazone (Avandia) Pioglitazone (Actos) DPP4 Inhibitors Saxagliptin (Onglyza) Sitagliptin (Januvia)   Lactic acidosis Possible calcium channel blockade   Unknown  Antiarrhythmics Class I  Flecainide Disopyramide Class III Sotalol Other Dronedarone  Negative inotrope, proarrhythmic   Proarrhythmic, beta blockade  Negative inotrope  Antihypertensives Alpha Blockers Doxazosin Calcium Channel Blockers Diltiazem Verapamil Nifedipine Central Alpha Adrenergics Moxonidine Peripheral Vasodilators Minoxidil  Increases renin and aldosterone  Negative inotrope    Possible sympathetic withdrawal  Unknown  Anti-infective Itraconazole Amphotericin B  Negative inotrope Unknown  Hematologic Anagrelide Cilostazol   Possible inhibition of PD IV Inhibition of PD III causing arrhythmias  Neurologic/Psychiatric Stimulants Anti-Seizure Drugs Carbamazepine Pregabalin Antidepressants Tricyclics Citalopram Parkinsons Bromocriptine Pergolide Pramipexole Antipsychotics Clozapine Antimigraine Ergotamine Methysergide Appetite suppressants Bipolar Lithium  Peripheral alpha and beta agonist activity  Negative inotrope and chronotrope Calcium channel blockade  Negative  inotrope, proarrhythmic Dose-dependent QT prolongation  Excessive serotonin activity/valvular damage Excessive serotonin activity/valvular damage Unknown  IgE mediated hypersensitivy, calcium channel blockade  Excessive serotonin activity/valvular damage Excessive serotonin activity/valvular damage Valvular damage  Direct myofibrillar degeneration, adrenergic stimulation  Antimalarials Chloroquine Hydroxychloroquine Intracellular inhibition of lysosomal enzymes  Urologic Agents Alpha Blockers Doxazosin Prazosin Tamsulosin Terazosin  Increased renin and aldosterone  Adapted from Page Carleene Overlie, et al. Drugs That May Cause or Exacerbate Heart Failure: A Scientific Statement from the American Heart  Association. Circulation 2016; 134:e32-e69. DOI: 10.1161/CIR.0000000000000426   MEDICATION ADHERENCES TIPS AND STRATEGIES Taking medication as prescribed improves patient outcomes in heart failure (reduces hospitalizations, improves symptoms, increases survival) Side effects of medications can be managed by decreasing doses, switching agents, stopping drugs, or adding additional therapy. Please let someone in the [facility] know if you have having bothersome side effects so we can modify your regimen. Do not alter your medication regimen without talking to [place].  Medication reminders can help patients remember to take drugs on time. If you are missing or forgetting doses you can try linking behaviors, using pill boxes, or an electronic reminder like an alarm on your phone or an app. Some people can also get automated phone calls as medication reminders.

## 2021-06-18 NOTE — Patient Instructions (Addendum)
Continue weighing daily and call for an overnight weight gain of 3 pounds or more or a weekly weight gain of more than 5 pounds.    Will be starting jardiance as 1 tablet every morning to help your heart work better.

## 2021-06-19 ENCOUNTER — Telehealth: Payer: Self-pay

## 2021-06-19 NOTE — Telephone Encounter (Signed)
Called and spoke to patient in regards to upcoming covid test. Nothing further needed °

## 2021-06-24 ENCOUNTER — Other Ambulatory Visit
Admission: RE | Admit: 2021-06-24 | Discharge: 2021-06-24 | Disposition: A | Discharge status: Home / Self Care | Payer: PPO | Source: Ambulatory Visit | Attending: Pulmonary Disease | Admitting: Pulmonary Disease

## 2021-06-24 ENCOUNTER — Other Ambulatory Visit: Payer: Self-pay

## 2021-06-24 DIAGNOSIS — Z01812 Encounter for preprocedural laboratory examination: Secondary | ICD-10-CM | POA: Diagnosis not present

## 2021-06-24 DIAGNOSIS — Z20822 Contact with and (suspected) exposure to covid-19: Secondary | ICD-10-CM | POA: Insufficient documentation

## 2021-06-25 ENCOUNTER — Ambulatory Visit: Payer: PPO | Attending: Pulmonary Disease

## 2021-06-25 DIAGNOSIS — J449 Chronic obstructive pulmonary disease, unspecified: Secondary | ICD-10-CM | POA: Insufficient documentation

## 2021-06-25 DIAGNOSIS — Z7951 Long term (current) use of inhaled steroids: Secondary | ICD-10-CM | POA: Insufficient documentation

## 2021-06-25 DIAGNOSIS — Z87891 Personal history of nicotine dependence: Secondary | ICD-10-CM | POA: Insufficient documentation

## 2021-06-25 LAB — SARS CORONAVIRUS 2 (TAT 6-24 HRS): SARS Coronavirus 2: NEGATIVE

## 2021-07-08 DIAGNOSIS — I2510 Atherosclerotic heart disease of native coronary artery without angina pectoris: Secondary | ICD-10-CM | POA: Diagnosis not present

## 2021-07-08 DIAGNOSIS — I10 Essential (primary) hypertension: Secondary | ICD-10-CM | POA: Diagnosis not present

## 2021-07-08 DIAGNOSIS — E782 Mixed hyperlipidemia: Secondary | ICD-10-CM | POA: Diagnosis not present

## 2021-07-08 DIAGNOSIS — I5032 Chronic diastolic (congestive) heart failure: Secondary | ICD-10-CM | POA: Diagnosis not present

## 2021-07-08 DIAGNOSIS — I509 Heart failure, unspecified: Secondary | ICD-10-CM | POA: Diagnosis not present

## 2021-07-08 DIAGNOSIS — I2584 Coronary atherosclerosis due to calcified coronary lesion: Secondary | ICD-10-CM | POA: Diagnosis not present

## 2021-07-08 DIAGNOSIS — I739 Peripheral vascular disease, unspecified: Secondary | ICD-10-CM | POA: Diagnosis not present

## 2021-07-08 DIAGNOSIS — Z6841 Body Mass Index (BMI) 40.0 and over, adult: Secondary | ICD-10-CM | POA: Diagnosis not present

## 2021-07-08 DIAGNOSIS — I700 Atherosclerosis of aorta: Secondary | ICD-10-CM | POA: Diagnosis not present

## 2021-07-08 DIAGNOSIS — J449 Chronic obstructive pulmonary disease, unspecified: Secondary | ICD-10-CM | POA: Diagnosis not present

## 2021-07-22 ENCOUNTER — Encounter: Payer: Self-pay | Admitting: Family

## 2021-07-22 ENCOUNTER — Other Ambulatory Visit: Payer: Self-pay

## 2021-07-22 ENCOUNTER — Ambulatory Visit: Payer: PPO | Attending: Family | Admitting: Family

## 2021-07-22 VITALS — BP 94/64 | HR 71 | Resp 18 | Ht 63.0 in | Wt 228.0 lb

## 2021-07-22 DIAGNOSIS — Z7984 Long term (current) use of oral hypoglycemic drugs: Secondary | ICD-10-CM | POA: Diagnosis not present

## 2021-07-22 DIAGNOSIS — R42 Dizziness and giddiness: Secondary | ICD-10-CM | POA: Insufficient documentation

## 2021-07-22 DIAGNOSIS — E119 Type 2 diabetes mellitus without complications: Secondary | ICD-10-CM | POA: Insufficient documentation

## 2021-07-22 DIAGNOSIS — J449 Chronic obstructive pulmonary disease, unspecified: Secondary | ICD-10-CM | POA: Diagnosis not present

## 2021-07-22 DIAGNOSIS — Z79899 Other long term (current) drug therapy: Secondary | ICD-10-CM | POA: Diagnosis not present

## 2021-07-22 DIAGNOSIS — I5022 Chronic systolic (congestive) heart failure: Secondary | ICD-10-CM | POA: Diagnosis not present

## 2021-07-22 DIAGNOSIS — Z7901 Long term (current) use of anticoagulants: Secondary | ICD-10-CM | POA: Insufficient documentation

## 2021-07-22 DIAGNOSIS — I10 Essential (primary) hypertension: Secondary | ICD-10-CM

## 2021-07-22 DIAGNOSIS — I110 Hypertensive heart disease with heart failure: Secondary | ICD-10-CM | POA: Insufficient documentation

## 2021-07-22 NOTE — Patient Instructions (Addendum)
The [facility] Clinic will be moving around the corner to [address] mid-February. Our phone number will remain the same.  Start taking furosemide/lasix 20 mg every day.  Return in month.   You are doing excellent!!

## 2021-07-22 NOTE — Progress Notes (Signed)
Patient ID: Sarah [name], female    DOB: 08-02-1950, 71 y.o.   MRN: [id number]  HPI  Sarah [name] is a 71 y/o female with a history of DM, HTN, COPD, previous tobacco use and chronic heart failure.   Echo report from 04/09/21 reviewed and showed an EF of 45-50% along with mild MR.   Admitted 04/08/21 due to acute on chronic heart failure. Initially given IV lasix with transition to oral diuretics. Discharged after 3 days.   She presents today for a follow-up visit with a chief complaint of minimal fatigue upon moderate exertion. She describes this as chronic in nature having been present for several years. She also endorses occasional dizziness. She denies any difficulty sleeping, abdominal distention, palpitations, pedal edema, chest pain, shortness of breath, cough or weight gain.   She weighs daily and has not had any weight gains.  Past Medical History:  Diagnosis Date   Carotid artery disease (HCC)    Congestive heart failure (HCC)    COPD (chronic obstructive pulmonary disease) (HCC)    Diabetes mellitus without complication (HCC)    pt denies, however is on metformin daily   HBP (high blood pressure)    Heart murmur    Past Surgical History:  Procedure Laterality Date   ABDOMINAL HYSTERECTOMY  1991   CARDIAC CATHETERIZATION  1999   COLONOSCOPY     ESOPHAGEAL DILATION N/A 02/11/2016   Procedure: ESOPHAGEAL DILATION;  Surgeon: Midge Minium, MD;  Location: [facility];  Service: Endoscopy;  Laterality: N/A;   ESOPHAGOGASTRODUODENOSCOPY (EGD) WITH PROPOFOL N/A 02/11/2016   Procedure: ESOPHAGOGASTRODUODENOSCOPY (EGD) WITH PROPOFOL;  Surgeon: Midge Minium, MD;  Location: [facility];  Service: Endoscopy;  Laterality: N/A;  DIABETIC   Family History  Problem Relation Age of Onset   Stroke Father    Hypertension Father    Hypertension Mother    Stroke Mother    Breast cancer Cousin        2 mat cousins   Breast cancer Cousin        pat cousin   Social History    Tobacco Use   Smoking status: Former    Packs/day: 0.50    Years: 27.00    Pack years: 13.50    Types: Cigarettes    Quit date: 01/15/1996    Years since quitting: 25.5   Smokeless tobacco: Never  Substance Use Topics   Alcohol use: Yes    Alcohol/week: 0.0 standard drinks    Comment: WINE WEEKLY   No Known Allergies  Prior to Admission medications   Medication Sig Start Date End Date Taking? Authorizing Provider  acetaminophen (TYLENOL) 500 MG tablet Take 500 mg by mouth as needed.   Yes [provider]  Ascorbic Acid (VITAMIN C) 100 MG tablet Take 100 mg by mouth daily.   Yes [provider]  aspirin 81 MG tablet Take 81 mg by mouth daily.   Yes [provider]  Calcium Carbonate-Vit D-Min (CALTRATE 600+D PLUS MINERALS) 600-800 MG-UNIT TABS Take 1 tablet by mouth daily.   Yes [provider]  carvedilol (COREG) 12.5 MG tablet Take 12.5 mg by mouth 2 (two) times daily with a meal. Reported on 10/08/2015   Yes [provider]  diphenhydramine-acetaminophen (TYLENOL PM) 25-500 MG TABS tablet Take 1 tablet by mouth at bedtime as needed (sleep).   Yes [provider]  furosemide (LASIX) 40 MG tablet Take 1 tablet (40 mg total) by mouth 2 (two) times daily. Patient taking  differently: Take 40 mg by mouth daily. 04/11/21 04/11/22 Yes Wieting, Richard, MD  isosorbide mononitrate (IMDUR) 30 MG 24 hr tablet Take 30 mg by mouth daily. 01/13/18  Yes [provider]  MAGNESIUM PO Take 200 mg by mouth daily.   Yes [provider]  Multiple Vitamins-Minerals (CENTRUM SILVER PO) Take 1 tablet by mouth daily.   Yes [provider]  simvastatin (ZOCOR) 40 MG tablet Take 40 mg by mouth daily at 6 PM.    Yes [provider]  SPIRIVA HANDIHALER 18 MCG inhalation capsule Place 18 mcg into inhaler and inhale daily. 09/09/19  Yes [provider]  spironolactone (ALDACTONE) 25 MG tablet Take 0.5 tablets (12.5 mg  total) by mouth daily. 04/12/21  Yes Wieting, Richard, MD  valsartan (DIOVAN) 80 MG tablet Take 1 tablet by mouth daily.   Yes [provider]  Tiotropium Bromide Monohydrate (SPIRIVA RESPIMAT) 2.5 MCG/ACT AERS Inhale 2 puffs into the lungs daily. 05/08/21   Sarah Saner, MD    Review of Systems  Constitutional:  Positive for fatigue. Negative for appetite change.  HENT:  Negative for congestion, postnasal drip and sore throat.   Eyes: Negative.   Respiratory:  Negative for cough, chest tightness and shortness of breath.   Cardiovascular:  Negative for chest pain, palpitations and leg swelling.  Gastrointestinal:  Negative for abdominal distention and abdominal pain.  Endocrine: Negative.   Genitourinary: Negative.   Musculoskeletal:  Negative for back pain and neck pain.  Skin: Negative.   Allergic/Immunologic: Negative.   Neurological:  Positive for dizziness and light-headedness. Negative for syncope.  Hematological:  Negative for adenopathy. Does not bruise/bleed easily.  Psychiatric/Behavioral:  Negative for dysphoric mood and sleep disturbance (sleeping on 1 pillow). The patient is not nervous/anxious.    Vitals:   07/22/21 1044  BP: 94/64  Pulse: 71  Resp: 18  SpO2: 100%  Weight: 228 lb (103.4 kg)  Height: 5\' 3"  (1.6 m)   Wt Readings from Last 3 Encounters:  07/22/21 228 lb (103.4 kg)  06/18/21 232 lb 4 oz (105.3 kg)  05/08/21 236 lb 3.2 oz (107.1 kg)   Lab Results  Component Value Date   CREATININE 0.91 04/11/2021   CREATININE 0.77 04/10/2021   CREATININE 0.81 04/09/2021   Physical Exam Vitals and nursing note reviewed.  Constitutional:      General: She is not in acute distress.    Appearance: Normal appearance.  HENT:     Head: Normocephalic and atraumatic.  Cardiovascular:     Rate and Rhythm: Normal rate and regular rhythm.     Heart sounds: No murmur heard. Pulmonary:     Effort: Pulmonary effort is normal. No respiratory distress.      Breath sounds: No wheezing or rales.  Abdominal:     General: There is no distension.     Palpations: Abdomen is soft.  Musculoskeletal:        General: No tenderness.     Cervical back: Normal range of motion and neck supple.     Right lower leg: No edema.     Left lower leg: No edema.  Skin:    General: Skin is warm and dry.  Neurological:     General: No focal deficit present.     Mental Status: She is alert and oriented to person, place, and time.  Psychiatric:        Mood and Affect: Mood normal.        Behavior: Behavior normal.  Thought Content: Thought content normal.   Assessment & Plan:  1: Chronic heart failure with reduced ejection fraction- - NYHA class II - euvolemic today - weighing daily; reminded to call for an overnight weight gain of > 2 [name] or a weekly weight gain of > 5 [name] - weight down 4 lbs from her last visit a month ago - walking daily for ~ 10-15 minutes each day - not adding salt and is reading food labels for sodium content as well as serving size - saw cardiology Sarah [name]) 07/08/21 (BP at his office was 100/70) - on GDMT of carvedilol, valsartan, jardiance, and spironolactone - BP is too soft to consider switching to entresto - will decrease her furosemide from 40 mg QD to 20 mg QD - possibly change lasix to PRN at next visit - BNP 04/08/21 was 223.9  2: HTN- - BP 94/64  - 100/70 at cardiologist two weeks ago - periods of dizziness and light-headedness - will decreased her lasix from 40 mg QD > 20 mg QD - BMP 04/11/21 reviewed and showed sodium 136, potassium 4.2, creatinine 0.91 & GFR >60 - will see PCP on 2/15 and will be getting lab work  3: COPD- - using spiriva - saw pulmonology Sarah [name]) 05/08/21, returns 08/15/21  4: DM- - A1c 04/09/21 was 6.0% - currently diet controlled.   Medication bottles reviewed.   Return in 1 month or sooner for any questions/problems before then.

## 2021-07-31 DIAGNOSIS — R7309 Other abnormal glucose: Secondary | ICD-10-CM | POA: Diagnosis not present

## 2021-07-31 DIAGNOSIS — I5030 Unspecified diastolic (congestive) heart failure: Secondary | ICD-10-CM | POA: Diagnosis not present

## 2021-07-31 DIAGNOSIS — I2510 Atherosclerotic heart disease of native coronary artery without angina pectoris: Secondary | ICD-10-CM | POA: Diagnosis not present

## 2021-07-31 DIAGNOSIS — E559 Vitamin D deficiency, unspecified: Secondary | ICD-10-CM | POA: Diagnosis not present

## 2021-07-31 DIAGNOSIS — I10 Essential (primary) hypertension: Secondary | ICD-10-CM | POA: Diagnosis not present

## 2021-07-31 DIAGNOSIS — E7800 Pure hypercholesterolemia, unspecified: Secondary | ICD-10-CM | POA: Diagnosis not present

## 2021-08-07 DIAGNOSIS — I5030 Unspecified diastolic (congestive) heart failure: Secondary | ICD-10-CM | POA: Diagnosis not present

## 2021-08-07 DIAGNOSIS — J449 Chronic obstructive pulmonary disease, unspecified: Secondary | ICD-10-CM | POA: Diagnosis not present

## 2021-08-07 DIAGNOSIS — R7309 Other abnormal glucose: Secondary | ICD-10-CM | POA: Diagnosis not present

## 2021-08-07 DIAGNOSIS — R109 Unspecified abdominal pain: Secondary | ICD-10-CM | POA: Diagnosis not present

## 2021-08-07 DIAGNOSIS — E7800 Pure hypercholesterolemia, unspecified: Secondary | ICD-10-CM | POA: Diagnosis not present

## 2021-08-07 DIAGNOSIS — I10 Essential (primary) hypertension: Secondary | ICD-10-CM | POA: Diagnosis not present

## 2021-08-12 ENCOUNTER — Telehealth: Payer: Self-pay

## 2021-08-12 NOTE — Telephone Encounter (Signed)
Received a medical records request sign from [facility] for patient. Printed medical records and faxed them to [phone]

## 2021-08-15 ENCOUNTER — Other Ambulatory Visit: Payer: Self-pay

## 2021-08-15 ENCOUNTER — Encounter: Payer: Self-pay | Admitting: Pulmonary Disease

## 2021-08-15 ENCOUNTER — Ambulatory Visit: Payer: PPO | Admitting: Pulmonary Disease

## 2021-08-15 VITALS — BP 122/82 | HR 68 | Temp 97.9°F | Ht 63.0 in | Wt 230.6 lb

## 2021-08-15 DIAGNOSIS — J449 Chronic obstructive pulmonary disease, unspecified: Secondary | ICD-10-CM

## 2021-08-15 DIAGNOSIS — Z87891 Personal history of nicotine dependence: Secondary | ICD-10-CM | POA: Diagnosis not present

## 2021-08-15 DIAGNOSIS — I5042 Chronic combined systolic (congestive) and diastolic (congestive) heart failure: Secondary | ICD-10-CM

## 2021-08-15 MED ORDER — SPIRIVA RESPIMAT 2.5 MCG/ACT IN AERS: 2.0000 | INHALATION_SPRAY | Freq: Every day | RESPIRATORY_TRACT | Status: DC

## 2021-08-15 MED ORDER — SPIRIVA RESPIMAT 2.5 MCG/ACT IN AERS: 2.0000 | INHALATION_SPRAY | Freq: Every day | RESPIRATORY_TRACT | 11 refills | Status: DC

## 2021-08-15 NOTE — Progress Notes (Signed)
Subjective:    Patient ID: Sarah [name], female    DOB: 12-30-1950, 71 y.o.   MRN: [id number] Patient Care Team: Janie Morning, DO as PCP - General (Family Medicine) Casilda Carls, MD as Consulting Physician (Internal Medicine) Bary Castilla, Forest Gleason, MD (General Surgery)  Chief Complaint  Patient presents with   Follow-up    HPI 71 year old female who follows here for the issue of moderate COPD and dyspnea on exertion.  This is a scheduled visit.  Patient was last seen by me on 08 May 2021.  She was instructed to continue Spiriva this was switched to the Respimat preparation as she was having difficulties finding the HandiHaler.  She is now obtaining the medication through the manufacturers assistance program.Since her prior visit she has not had any fevers, chills or sweats.  Her dyspnea is at baseline or better.  She does not endorse any orthopnea or paroxysmal nocturnal dyspnea.  No lower extremity edema or calf tenderness.   She has not had any decompensations of COPD since her last visit.  No hospital admissions.   Overall today she looks well and feels well.    DATA: 04/29/2016 PFTs: FEV1 1.59 L or 82% predicted, FVC 2.39 L or 98% predicted, FEV1/FVC 67%, diffusion capacity 70%.  Consistent with mild obstruction, concomitant restriction likely due to obesity. 03/17/2019 CT chest: No parenchymal abnormalities, specifically no lung nodule.  No emphysema. 04/08/2021 chest x-ray PA and lateral: Diffuse right lung airspace disease consistent with multifocal pneumonia. 04/09/2021 echocardiogram: LVEF 45 to 50% with mild decreased function in the LV.  No wall motion abnormalities.  Diastolic dysfunction grade 1.  Mild mitral regurgitation. 05/08/2021 chest x-ray PA lateral: No acute cardiopulmonary disease, pneumonia resolved. 06/25/2021 PFTs: FEV1 1.78 L or 100% predicted, FVC 2.37 L or 103% of predicted.  FEV1/FVC 75% lung volumes normal, no bronchodilator response diffusion  capacity 82%, low end of normal.  Compared to PFTs performed November 2017 there has been improvement.  Review of Systems A 10 point review of systems was performed and it is as noted above otherwise negative.  Patient Active Problem List   Diagnosis Date Noted   Impaired fasting glucose    Acute pain of right shoulder    Acute on chronic combined systolic and diastolic CHF (congestive heart failure) ([place]) 04/08/2021   Diabetes mellitus without complication (HCC)    COPD (chronic obstructive pulmonary disease) (HCC)    HLD (hyperlipidemia)    CAD (coronary artery disease)    Chest pain    Osteoarthritis of knee 07/09/2017   Patellofemoral stress syndrome 07/09/2017   Venous insufficiency of both lower extremities 02/11/2017   Chronic diastolic CHF (congestive heart failure), NYHA class 2 ([place]) 05/12/2016   Controlled type 2 diabetes mellitus without complication, without long-term current use of insulin ([place]) 05/12/2016   Essential hypertension 04/04/2016   Mixed hyperlipidemia 04/04/2016   Other diseases of stomach and duodenum    Problems with swallowing and mastication    Left buttock abscess 12/15/2014   DOE (dyspnea on exertion) 06/05/2014   COPD type A ([place]) 06/05/2014   Morbid obesity with BMI of 40.0-44.9, adult ([place]) 06/05/2014   Social History   Tobacco Use   Smoking status: Former    Packs/day: 0.50    Years: 27.00    Pack years: 13.50    Types: Cigarettes    Quit date: 01/15/1996    Years since quitting: 25.6   Smokeless tobacco: Never  Substance Use Topics   Alcohol use: Yes  Alcohol/week: 0.0 standard drinks    Comment: WINE WEEKLY   No Known Allergies  Current Meds  Medication Sig   acetaminophen (TYLENOL) 500 MG tablet Take 500 mg by mouth as needed.   Ascorbic Acid (VITAMIN C) 100 MG tablet Take 100 mg by mouth daily.   aspirin 81 MG tablet Take 81 mg by mouth daily.   Calcium Carbonate-Vit D-Min (CALTRATE 600+D PLUS MINERALS) 600-800 MG-UNIT TABS  Take 1 tablet by mouth daily.   carvedilol (COREG) 12.5 MG tablet Take 12.5 mg by mouth 2 (two) times daily with a meal. Reported on 10/08/2015   empagliflozin (JARDIANCE) 10 MG TABS tablet Take 1 tablet (10 mg total) by mouth daily before breakfast.   furosemide (LASIX) 40 MG tablet Take 1 tablet (40 mg total) by mouth 2 (two) times daily. (Patient taking differently: Take 20 mg by mouth daily.)   isosorbide mononitrate (IMDUR) 30 MG 24 hr tablet Take 30 mg by mouth daily.   MAGNESIUM PO Take 200 mg by mouth daily.   Multiple Vitamins-Minerals (CENTRUM SILVER PO) Take 1 tablet by mouth daily.   simvastatin (ZOCOR) 40 MG tablet Take 40 mg by mouth daily at 6 PM.    SPIRIVA HANDIHALER 18 MCG inhalation capsule Place 18 mcg into inhaler and inhale daily.   spironolactone (ALDACTONE) 25 MG tablet Take 0.5 tablets (12.5 mg total) by mouth daily.   Tiotropium Bromide Monohydrate (SPIRIVA RESPIMAT) 2.5 MCG/ACT AERS Inhale 2 puffs into the lungs daily.   valsartan (DIOVAN) 80 MG tablet Take 1 tablet by mouth daily.   Immunization History  Administered Date(s) Administered   Fluad Quad(high Dose 65+) 05/08/2021   Influenza, High Dose Seasonal PF 04/09/2018   Influenza, Quadrivalent, Recombinant, Inj, Pf 04/18/2019, 04/30/2020   Influenza-Unspecified 03/02/2014, 04/18/2019   Moderna Sars-Covid-2 Vaccination 07/07/2019, 07/29/2019, 08/29/2019   PNEUMOCOCCAL CONJUGATE-20 01/30/2021   PPD Test 08/19/2019, 09/21/2020   Pneumococcal Polysaccharide-23 05/17/2015       Objective:   Physical Exam BP 122/82 (BP Location: Left Arm, Patient Position: Sitting, Cuff Size: Normal)   Pulse 68   Temp 97.9 F (36.6 C) (Oral)   Ht '5\' 3"'$  (1.6 m)   Wt 230 lb 9.6 oz (104.6 kg)   SpO2 97%   BMI 40.85 kg/m   GENERAL: Obese woman, no acute distress, fully ambulatory.  No conversational dyspnea. HEAD: Normocephalic, atraumatic.  EYES: Pupils equal, round, reactive to light.  No scleral icterus.  MOUTH:  Dentition is full.  Oral mucosa moist.  No thrush. NECK: Supple. No thyromegaly. Trachea midline. No JVD.  No adenopathy. PULMONARY: Good air entry bilaterally.  No adventitious sounds. CARDIOVASCULAR: S1 and S2. Regular rate and rhythm.  No rubs, murmurs or gallops heard. ABDOMEN: Obese otherwise benign. MUSCULOSKELETAL: No joint deformity, no clubbing, no edema.  NEUROLOGIC: No overt focal deficit, no gait disturbance, speech is fluent. SKIN: Intact,warm,dry. PSYCH: Mood and behavior normal.     Assessment & Plan:     ICD-10-CM   1. Stage 2 moderate COPD by GOLD classification ([place])  J44.9    Continue Spiriva and as needed albuterol Which Spiriva to Respimat preparation Patient taught proper use of the inhaler Provided samples and prescription    2. Chronic combined systolic (congestive) and diastolic (congestive) heart failure (HCC)  I50.42    This issue adds to her management Follows with cardiology    3. Former smoker  Z87.891    No evidence of relapse     Meds ordered this encounter  Medications  Tiotropium Bromide Monohydrate (SPIRIVA RESPIMAT) 2.5 MCG/ACT AERS    Sig: Inhale 2 puffs into the lungs daily.    Dispense:  4 g    Refill:  MCG    Order Specific Question:   Lot Number?    Answer:   [id number]    Order Specific Question:   Expiration Date?    Answer:   08/13/2022    Order Specific Question:   NDC    Answer:   [id number]    Order Specific Question:   Quantity    Answer:   2   Tiotropium Bromide Monohydrate (SPIRIVA RESPIMAT) 2.5 MCG/ACT AERS    Sig: Inhale 2 puffs into the lungs daily.    Dispense:  4 g    Refill:  11    Patient finds Respimat less irritating than Spiriva HandiHaler capsule.  This is a change in formulation for the patient   We will see the patient in follow-up in 4 months time he is to contact us prior to that time should any new difficulties arise.  Renold Don, MD Advanced Bronchoscopy PCCM [place]  [facility]    *This note was dictated using voice recognition software/Dragon.  Despite best efforts to proofread, errors can occur which can change the meaning. Any transcriptional errors that result from this process are unintentional and may not be fully corrected at the time of dictation.

## 2021-08-15 NOTE — Patient Instructions (Signed)
I have given you some samples of the Spiriva Respimat. ? ?I have switched your prescription to this formulation. ? ?We will see you in follow-up in 4 months time call sooner should any new problems arise. ?

## 2021-08-20 ENCOUNTER — Ambulatory Visit: Payer: PPO | Attending: Family | Admitting: Family

## 2021-08-20 ENCOUNTER — Encounter: Payer: Self-pay | Admitting: Family

## 2021-08-20 ENCOUNTER — Other Ambulatory Visit: Payer: Self-pay

## 2021-08-20 ENCOUNTER — Encounter: Payer: Self-pay | Admitting: Pharmacist

## 2021-08-20 VITALS — BP 96/62 | HR 65 | Resp 18 | Ht 63.0 in | Wt 229.1 lb

## 2021-08-20 DIAGNOSIS — J449 Chronic obstructive pulmonary disease, unspecified: Secondary | ICD-10-CM | POA: Diagnosis not present

## 2021-08-20 DIAGNOSIS — I10 Essential (primary) hypertension: Secondary | ICD-10-CM

## 2021-08-20 DIAGNOSIS — I110 Hypertensive heart disease with heart failure: Secondary | ICD-10-CM | POA: Insufficient documentation

## 2021-08-20 DIAGNOSIS — I5022 Chronic systolic (congestive) heart failure: Secondary | ICD-10-CM

## 2021-08-20 DIAGNOSIS — E119 Type 2 diabetes mellitus without complications: Secondary | ICD-10-CM | POA: Diagnosis not present

## 2021-08-20 DIAGNOSIS — Z87891 Personal history of nicotine dependence: Secondary | ICD-10-CM | POA: Diagnosis not present

## 2021-08-20 NOTE — Progress Notes (Signed)
[facility] - HEART FAILURE CLINIC - PHARMACIST COUNSELING NOTE ? ?Guideline-Directed Medical Therapy/Evidence Based Medicine ? ?ACE/ARB/ARNI: Valsartan 80 mg daily ?Beta Blocker: Carvedilol 12.5 mg twice daily ?Aldosterone Antagonist: Spironolactone 12.5 mg daily ?Diuretic: Furosemide 20 mg daily ?SGLT2i: Empagliflozin 10 mg daily ? ?Adherence Assessment ? ?Do you ever forget to take your medication? [] Yes ?[x] No  ?Do you ever skip doses due to side effects? [] Yes ?[x] No  ?Do you have trouble affording your medicines? [] Yes ?[x] No  ?Are you ever unable to pick up your medication due to transportation difficulties? [] Yes ?[x] No  ?Do you ever stop taking your medications because you don't believe they are helping? [] Yes ?[x] No  ?Do you check your weight daily? [x] Yes ?[] No  ? ?Adherence strategy: none - she "is good taking medication" ? ?Barriers to obtaining medications: none ? ?Vital signs: HR 65, BP 96/62, weight (pounds) 229 lb ?ECHO: Date 04/09/21, EF 45-50%, EF 45050% ? ?BMP Latest Ref Rng & Units 04/11/2021 04/10/2021 04/09/2021  ?Glucose 70 - 99 mg/dL ) ) 99  ?BUN 8 - 23 mg/dL 13 16 15   ?Creatinine 0.44 - 1.00 mg/dL    ?Sodium 135 - 145 mmol/L 136 138 137  ?Potassium 3.5 - 5.1 mmol/L 4.2 3.7 3.4(L)  ?Chloride 98 - 111 mmol/L 104 105 103  ?CO2 22 - 32 mmol/L 28 27 26   ?Calcium 8.9 - 10.3 mg/dL ) 9.1 9.0  ? ? ?Past Medical History:  ?Diagnosis Date  ? Carotid artery disease (HCC)   ? Congestive heart failure (HCC)   ? COPD (chronic obstructive pulmonary disease) (HCC)   ? Diabetes mellitus without complication (HCC)   ? pt denies, however is on metformin daily  ? HBP (high blood pressure)   ? Heart murmur   ? ? ?ASSESSMENT ?71 year old female who presents to the HF clinic for follow up. PMH includes  DM, HTN, COPD, previous tobacco use and chronic heart failure. Last ED visit 04/08/21 for [facility] exacerbation. Noted BP is on the soft side, but patient denies  dizziness, chest pain, lightheadedness, or any new symptoms. She never checks BP at home. Noted urine is slightly darker (darker yellow) thanks usual and not much urination. ? ?PLAN ?Recommend changing furosemide to 20mg  daily PRN ?Continue all other medication as previously prescribed. ? ? ?Time spent: 20 minutes ? ?Majesty Stehlin Rodriguez-Guzman PharmD, BCPS ?08/20/2021 12:48 PM ? ? ?Current Outpatient Medications:  ?  acetaminophen (TYLENOL) 500 MG tablet, Take 500 mg by mouth as needed., Disp: , Rfl:  ?  ascorbic acid (VITAMIN C) 1000 MG tablet, Take 1,000 mg by mouth daily., Disp: , Rfl:  ?  aspirin 81 MG tablet, Take 81 mg by mouth daily., Disp: , Rfl:  ?  Calcium Carbonate-Vit D-Min (CALTRATE 600+D PLUS MINERALS) 600-800 MG-UNIT TABS, Take 1 tablet by mouth daily., Disp: , Rfl:  ?  carvedilol (COREG) 12.5 MG tablet, Take 12.5 mg by mouth 2 (two) times daily with a meal. Reported on 10/08/2015, Disp: , Rfl:  ?  empagliflozin (JARDIANCE) 10 MG TABS tablet, Take 1 tablet (10 mg total) by mouth daily before breakfast., Disp: 30 tablet, Rfl: 5 ?  furosemide (LASIX) 40 MG tablet, Take 1 tablet (40 mg total) by mouth 2 (two) times daily. (Patient taking differently: Take 20 mg by mouth daily.), Disp: 60 tablet, Rfl: 0 ?  isosorbide mononitrate (IMDUR) 30 MG 24 hr tablet, Take 30 mg by mouth daily., Disp: , Rfl: 1 ?  MAGNESIUM PO, Take 200 mg by  mouth daily., Disp: , Rfl:  ?  Multiple Vitamins-Minerals (CENTRUM SILVER PO), Take 1 tablet by mouth daily., Disp: , Rfl:  ?  simvastatin (ZOCOR) 40 MG tablet, Take 40 mg by mouth daily at 6 PM. , Disp: , Rfl:  ?  spironolactone (ALDACTONE) 25 MG tablet, Take 0.5 tablets (12.5 mg total) by mouth daily., Disp: 30 tablet, Rfl: 0 ?  Tiotropium Bromide Monohydrate (SPIRIVA RESPIMAT) 2.5 MCG/ACT AERS, Inhale 2 puffs into the lungs daily., Disp: 4 g, Rfl: MCG ?  Tiotropium Bromide Monohydrate (SPIRIVA RESPIMAT) 2.5 MCG/ACT AERS, Inhale 2 puffs into the lungs daily., Disp: 4 g, Rfl: 11 ?   valsartan (DIOVAN) 80 MG tablet, Take 1 tablet by mouth daily., Disp: , Rfl:  ? ? ?MEDICATION ADHERENCES TIPS AND STRATEGIES ?Taking medication as prescribed improves patient outcomes in heart failure (reduces hospitalizations, improves symptoms, increases survival) ?Side effects of medications can be managed by decreasing doses, switching agents, stopping drugs, or adding additional therapy. Please let someone in the Heart Failure Clinic know if you have having bothersome side effects so we can modify your regimen. Do not alter your medication regimen without talking to [place].  ?Medication reminders can help patients remember to take drugs on time. If you are missing or forgetting doses you can try linking behaviors, using pill boxes, or an electronic reminder like an alarm on your phone or an app. Some people can also get automated phone calls as medication reminders.  ? ?

## 2021-08-20 NOTE — Progress Notes (Signed)
? Patient ID: Sarah [name], female    DOB: [birth year], 71 y.o.   MRN: [id number] ? ? ?Ms Madewell is a 71 y/o female with a history of DM, HTN, COPD, previous tobacco use and chronic heart failure.  ? ?Echo report from 04/09/21 reviewed and showed an EF of 45-50% along with mild MR.  ? ?Admitted 04/08/21 due to acute on chronic heart failure. Initially given IV lasix with transition to oral diuretics. Discharged after 3 days.  ? ?She presents today for a follow-up visit with a chief complaint of minimal fatigue upon moderate exertion. She describes this as chronic in nature having been present for several years. She also endorses occasional dizziness. She denies any difficulty sleeping, abdominal distention, palpitations, pedal edema, chest pain, shortness of breath, cough or weight gain.  ? ?She weighs daily and has not had any weight gains, states her weights stay between 228-230.  ? ?Past Medical History:  ?Diagnosis Date  ? Carotid artery disease (HCC)   ? Congestive heart failure (HCC)   ? COPD (chronic obstructive pulmonary disease) (HCC)   ? Diabetes mellitus without complication (HCC)   ? pt denies, however is on metformin daily  ? HBP (high blood pressure)   ? Heart murmur   ? ?Past Surgical History:  ?Procedure Laterality Date  ? ABDOMINAL HYSTERECTOMY  1991  ? CARDIAC CATHETERIZATION  1999  ? COLONOSCOPY    ? ESOPHAGEAL DILATION N/A 02/11/2016  ? Procedure: ESOPHAGEAL DILATION;  Surgeon: Midge Minium, MD;  Location[facility];  Service: Endoscopy;  Laterality: N/A;  ? ESOPHAGOGASTRODUODENOSCOPY (EGD) WITH PROPOFOL N/A 02/11/2016  ? Procedure: ESOPHAGOGASTRODUODENOSCOPY (EGD) WITH PROPOFOL;  Surgeon: Midge Minium, MD;  Location[facility];  Service: Endoscopy;  Laterality: N/A;  DIABETIC  ? ?Family History  ?Problem Relation Age of Onset  ? Stroke Father   ? Hypertension Father   ? Hypertension Mother   ? Stroke Mother   ? Breast cancer Cousin   ?     2 mat cousins  ? Breast cancer Cousin    ?     pat cousin  ? ?Social History  ? ?Tobacco Use  ? Smoking status: Former  ?  Packs/day: 0.50  ?  Years: 27.00  ?  Pack years: 13.50  ?  Types: Cigarettes  ?  Quit date: 01/15/1996  ?  Years since quitting: 25.6  ? Smokeless tobacco: Never  ?Substance Use Topics  ? Alcohol use: Yes  ?  Alcohol/week: 0.0 standard drinks  ?  Comment: WINE WEEKLY  ? ?No Known Allergies ? ?Prior to Admission medications   ?Medication Sig Start Date End Date Taking? Authorizing Provider  ?acetaminophen (TYLENOL) 500 MG tablet Take 500 mg by mouth as needed.   Yes [provider]  ?Ascorbic Acid (VITAMIN C) 100 MG tablet Take 100 mg by mouth daily.   Yes [provider]  ?aspirin 81 MG tablet Take 81 mg by mouth daily.   Yes [provider]  ?Calcium Carbonate-Vit D-Min (CALTRATE 600+D PLUS MINERALS) 600-800 MG-UNIT TABS Take 1 tablet by mouth daily.   Yes [provider]  ?carvedilol (COREG) 12.5 MG tablet Take 12.5 mg by mouth 2 (two) times daily with a meal. Reported on 10/08/2015   Yes [provider]  ?diphenhydramine-acetaminophen (TYLENOL PM) 25-500 MG TABS tablet Take 1 tablet by mouth at bedtime as needed (sleep).   Yes [provider]  ?furosemide (LASIX) 40 MG tablet Take 1 tablet (40 mg total) by mouth  2 (two) times daily. ?Patient taking differently: Take 40 mg by mouth daily. 04/11/21 04/11/22 Yes Wieting, Richard, MD  ?isosorbide mononitrate (IMDUR) 30 MG 24 hr tablet Take 30 mg by mouth daily. 01/13/18  Yes [provider]  ?MAGNESIUM PO Take 200 mg by mouth daily.   Yes [provider]  ?Multiple Vitamins-Minerals (CENTRUM SILVER PO) Take 1 tablet by mouth daily.   Yes [provider]  ?simvastatin (ZOCOR) 40 MG tablet Take 40 mg by mouth daily at 6 PM.    Yes [provider]  ?SPIRIVA HANDIHALER 18 MCG inhalation capsule Place 18 mcg into inhaler and inhale daily. 09/09/19  Yes [provider]  ?spironolactone (ALDACTONE)  25 MG tablet Take 0.5 tablets (12.5 mg total) by mouth daily. 04/12/21  Yes Wieting, Richard, MD  ?valsartan (DIOVAN) 80 MG tablet Take 1 tablet by mouth daily.   Yes [provider]  ?Tiotropium Bromide Monohydrate (SPIRIVA RESPIMAT) 2.5 MCG/ACT AERS Inhale 2 puffs into the lungs daily. 05/08/21   Salena Saner, MD  ? ? ?Review of Systems  ?Constitutional:  Positive for fatigue. Negative for appetite change.  ?HENT:  Negative for congestion, postnasal drip and sore throat.   ?Eyes: Negative.   ?Respiratory:  Negative for cough, chest tightness and shortness of breath.   ?Cardiovascular:  Negative for chest pain, palpitations and leg swelling.  ?Gastrointestinal:  Negative for abdominal distention and abdominal pain.  ?Endocrine: Negative.   ?Genitourinary: Negative.   ?Musculoskeletal:  Negative for back pain and neck pain.  ?Skin: Negative.   ?Allergic/Immunologic: Negative.   ?Neurological:  Positive for dizziness and light-headedness. Negative for syncope.  ?Hematological:  Negative for adenopathy. Does not bruise/bleed easily.  ?Psychiatric/Behavioral:  Negative for dysphoric mood and sleep disturbance (sleeping on 1 pillow). The patient is not nervous/anxious.   ? ?Vitals:  ? 08/20/21 1042  ?BP: 96/62  ?Pulse: 65  ?Resp: 18  ?SpO2: 99%  ?Weight: 229 lb 1 oz (103.9 kg)  ?Height: 5\' 3"  (1.6 m)  ? ?Wt Readings from Last 3 Encounters:  ?08/20/21 229 lb 1 oz (103.9 kg)  ?08/15/21 230 lb 9.6 oz (104.6 kg)  ?07/22/21 228 lb (103.4 kg)  ? ?Lab Results  ?Component Value Date  ? CREATININE 0.91 04/11/2021  ? CREATININE 0.77 04/10/2021  ? CREATININE 0.81 04/09/2021  ? ?Physical Exam ?Vitals and nursing note reviewed.  ?Constitutional:   ?   General: She is not in acute distress. ?   Appearance: Normal appearance.  ?HENT:  ?   Head: Normocephalic and atraumatic.  ?Cardiovascular:  ?   Rate and Rhythm: Normal rate and regular rhythm.  ?Pulmonary:  ?   Effort: Pulmonary effort is normal. No respiratory  distress.  ?   Breath sounds: No wheezing or rales.  ?Abdominal:  ?   General: There is no distension.  ?   Palpations: Abdomen is soft.  ?Musculoskeletal:     ?   General: No tenderness.  ?   Cervical back: Normal range of motion and neck supple.  ?   Right lower leg: No edema.  ?   Left lower leg: No edema.  ?Skin: ?   General: Skin is warm and dry.  ?Neurological:  ?   General: No focal deficit present.  ?   Mental Status: She is alert and oriented to person, place, and time.  ?Psychiatric:     ?   Mood and Affect: Mood normal.     ?   Behavior: Behavior normal.     ?  Thought Content: Thought content normal.  ? ?Assessment & Plan: ? ?1: Chronic heart failure with reduced ejection fraction- ?- NYHA class II ?- euvolemic today ?- weighing daily; reminded to call for an overnight weight gain of > 2 pounds or a weekly weight gain of > 5 pounds ?- weight 229 today, up 1 lb from her last visit 1 month ago ?- walking daily for ~ 10-15 minutes each day ?- not adding salt and is reading food labels for sodium content as well as serving size ?- saw cardiology Gwen Pounds) 07/08/21 (BP at his office was 100/70) ?- on GDMT of carvedilol, valsartan, jardiance, and spironolactone ?- BP is too soft to consider switching to entresto ?- lasix changed to PRN, if weight is above 231 she will take her lasix ?- BNP 04/08/21 was 223.9 ?- pharmD reconciled medicaitons ? ?2: HTN- ?- BP 96/62 ?- periods of dizziness and light-headedness; no syncope ?- lasix changed to PRN, if weight is above 231 she will take her lasix ?- BMP 08/03/21 reviewed and showed sodium 142, potassium 4.4, creatinine 0.90 & GFR 75 ?- saw PCP Thomasena Edis) on 2/15 ? ?3: COPD- ?- using spiriva ?- saw pulmonology Jayme Cloud) 08/15/21 ? ?4: DM- ?- A1c 08/03/21 was 6.0% ?- currently diet controlled. ? ? ?Medication bottles reviewed.  ? ?Return in 3 months or sooner for any questions/problems before then.  ? ? ?

## 2021-08-20 NOTE — Patient Instructions (Signed)
If you have voicemail, please make sure your mailbox is cleaned out so that we may leave a message and please make sure to listen to any voicemails.   ? ?Change your furosemide to take as needed for weight of 231 or more. ? ?Continue to weigh daily, continue to adhere to < 64 ounces of water.  ? ?Continue to adhere to < 2,000 mg sodium/day. ? ?Return in 3 months.  ? ? ?

## 2021-09-09 ENCOUNTER — Other Ambulatory Visit: Payer: Self-pay

## 2021-09-09 ENCOUNTER — Ambulatory Visit (LOCAL_COMMUNITY_HEALTH_CENTER): Payer: Self-pay

## 2021-09-09 DIAGNOSIS — Z111 Encounter for screening for respiratory tuberculosis: Secondary | ICD-10-CM

## 2021-09-12 ENCOUNTER — Ambulatory Visit (LOCAL_COMMUNITY_HEALTH_CENTER): Payer: Self-pay

## 2021-09-12 DIAGNOSIS — Z111 Encounter for screening for respiratory tuberculosis: Secondary | ICD-10-CM

## 2021-09-12 LAB — TB SKIN TEST
Induration: 0 mm
TB Skin Test: NEGATIVE

## 2021-09-20 DIAGNOSIS — I739 Peripheral vascular disease, unspecified: Secondary | ICD-10-CM | POA: Diagnosis not present

## 2021-09-20 DIAGNOSIS — Z6835 Body mass index (BMI) 35.0-35.9, adult: Secondary | ICD-10-CM | POA: Diagnosis not present

## 2021-09-20 DIAGNOSIS — I509 Heart failure, unspecified: Secondary | ICD-10-CM | POA: Diagnosis not present

## 2021-09-20 DIAGNOSIS — J449 Chronic obstructive pulmonary disease, unspecified: Secondary | ICD-10-CM | POA: Diagnosis not present

## 2021-10-28 DIAGNOSIS — R7309 Other abnormal glucose: Secondary | ICD-10-CM | POA: Diagnosis not present

## 2021-10-28 DIAGNOSIS — E7800 Pure hypercholesterolemia, unspecified: Secondary | ICD-10-CM | POA: Diagnosis not present

## 2021-10-28 DIAGNOSIS — I5030 Unspecified diastolic (congestive) heart failure: Secondary | ICD-10-CM | POA: Diagnosis not present

## 2021-10-28 DIAGNOSIS — I10 Essential (primary) hypertension: Secondary | ICD-10-CM | POA: Diagnosis not present

## 2021-11-04 DIAGNOSIS — I10 Essential (primary) hypertension: Secondary | ICD-10-CM | POA: Diagnosis not present

## 2021-11-04 DIAGNOSIS — E559 Vitamin D deficiency, unspecified: Secondary | ICD-10-CM | POA: Diagnosis not present

## 2021-11-04 DIAGNOSIS — R7309 Other abnormal glucose: Secondary | ICD-10-CM | POA: Diagnosis not present

## 2021-11-04 DIAGNOSIS — I5030 Unspecified diastolic (congestive) heart failure: Secondary | ICD-10-CM | POA: Diagnosis not present

## 2021-11-04 DIAGNOSIS — Z1211 Encounter for screening for malignant neoplasm of colon: Secondary | ICD-10-CM | POA: Diagnosis not present

## 2021-11-04 DIAGNOSIS — E7800 Pure hypercholesterolemia, unspecified: Secondary | ICD-10-CM | POA: Diagnosis not present

## 2021-11-04 DIAGNOSIS — R946 Abnormal results of thyroid function studies: Secondary | ICD-10-CM | POA: Diagnosis not present

## 2021-11-15 ENCOUNTER — Telehealth: Payer: Self-pay

## 2021-11-15 NOTE — Telephone Encounter (Signed)
CALLED PATIENT NO ANSWER LEFT VOICEMAIL FOR A CALL BACK ? ?

## 2021-11-18 ENCOUNTER — Encounter: Payer: Self-pay | Admitting: Family Medicine

## 2021-11-19 ENCOUNTER — Other Ambulatory Visit: Payer: Self-pay

## 2021-11-19 DIAGNOSIS — Z1211 Encounter for screening for malignant neoplasm of colon: Secondary | ICD-10-CM

## 2021-11-19 MED ORDER — NA SULFATE-K SULFATE-MG SULF 17.5-3.13-1.6 GM/177ML PO SOLN
1.0000 | Freq: Once | ORAL | 0 refills | Status: AC
Start: 2021-11-19 — End: 2021-11-19

## 2021-11-19 NOTE — Progress Notes (Unsigned)
Gastroenterology Pre-Procedure Review  Request Date: 12/09/2021 Requesting Physician: Dr. Servando Snare  PATIENT REVIEW QUESTIONS: The patient responded to the following health history questions as indicated:    1. Are you having any GI issues? no 2. Do you have a personal history of Polyps? no 3. Do you have a family history of Colon Cancer or Polyps? no 4. Diabetes Mellitus? no 5. Joint replacements in the past 12 months?no 6. Major health problems in the past 3 months?no 7. Any artificial heart valves, MVP, or defibrillator?no    MEDICATIONS & ALLERGIES:    Patient reports the following regarding taking any anticoagulation/antiplatelet therapy:   Plavix, Coumadin, Eliquis, Xarelto, Lovenox, Pradaxa, Brilinta, or Effient? no Aspirin? yes (81mg )  Patient confirms/reports the following medications:  Current Outpatient Medications  Medication Sig Dispense Refill   acetaminophen (TYLENOL) 500 MG tablet Take 500 mg by mouth as needed.     ascorbic acid (VITAMIN C) 1000 MG tablet Take 1,000 mg by mouth daily.     aspirin 81 MG tablet Take 81 mg by mouth daily.     Calcium Carbonate-Vit D-Min (CALTRATE 600+D PLUS MINERALS) 600-800 MG-UNIT TABS Take 1 tablet by mouth daily.     carvedilol (COREG) 12.5 MG tablet Take 12.5 mg by mouth 2 (two) times daily with a meal. Reported on 10/08/2015     empagliflozin (JARDIANCE) 10 MG TABS tablet Take 1 tablet (10 mg total) by mouth daily before breakfast. 30 tablet 5   furosemide (LASIX) 40 MG tablet Take 1 tablet (40 mg total) by mouth 2 (two) times daily. (Patient taking differently: Take 20 mg by mouth daily.) 60 tablet 0   isosorbide mononitrate (IMDUR) 30 MG 24 hr tablet Take 30 mg by mouth daily.  1   MAGNESIUM PO Take 200 mg by mouth daily.     Multiple Vitamins-Minerals (CENTRUM SILVER PO) Take 1 tablet by mouth daily.     simvastatin (ZOCOR) 40 MG tablet Take 40 mg by mouth daily at 6 PM.      spironolactone (ALDACTONE) 25 MG tablet Take 0.5 tablets  (12.5 mg total) by mouth daily. 30 tablet 0   Tiotropium Bromide Monohydrate (SPIRIVA RESPIMAT) 2.5 MCG/ACT AERS Inhale 2 puffs into the lungs daily. 4 g MCG   Tiotropium Bromide Monohydrate (SPIRIVA RESPIMAT) 2.5 MCG/ACT AERS Inhale 2 puffs into the lungs daily. 4 g 11   valsartan (DIOVAN) 80 MG tablet Take 1 tablet by mouth daily.     No current facility-administered medications for this visit.    Patient confirms/reports the following allergies:  No Known Allergies  No orders of the defined types were placed in this encounter.   AUTHORIZATION INFORMATION Primary Insurance: 1D#: Group #:  Secondary Insurance: 1D#: Group #:  SCHEDULE INFORMATION: Date: 12/09/2021 Time: Location:[facility]

## 2021-11-21 ENCOUNTER — Encounter: Payer: Self-pay | Admitting: Family

## 2021-11-21 ENCOUNTER — Ambulatory Visit: Payer: PPO | Attending: Family | Admitting: Family

## 2021-11-21 ENCOUNTER — Other Ambulatory Visit: Payer: Self-pay | Admitting: Family Medicine

## 2021-11-21 VITALS — BP 105/66 | HR 65 | Resp 14 | Ht 63.0 in | Wt 234.1 lb

## 2021-11-21 DIAGNOSIS — I10 Essential (primary) hypertension: Secondary | ICD-10-CM | POA: Diagnosis not present

## 2021-11-21 DIAGNOSIS — E119 Type 2 diabetes mellitus without complications: Secondary | ICD-10-CM | POA: Diagnosis not present

## 2021-11-21 DIAGNOSIS — I5022 Chronic systolic (congestive) heart failure: Secondary | ICD-10-CM | POA: Diagnosis not present

## 2021-11-21 DIAGNOSIS — Z7984 Long term (current) use of oral hypoglycemic drugs: Secondary | ICD-10-CM | POA: Insufficient documentation

## 2021-11-21 DIAGNOSIS — J449 Chronic obstructive pulmonary disease, unspecified: Secondary | ICD-10-CM | POA: Diagnosis not present

## 2021-11-21 DIAGNOSIS — I110 Hypertensive heart disease with heart failure: Secondary | ICD-10-CM | POA: Diagnosis not present

## 2021-11-21 DIAGNOSIS — Z87891 Personal history of nicotine dependence: Secondary | ICD-10-CM | POA: Insufficient documentation

## 2021-11-21 DIAGNOSIS — Z79899 Other long term (current) drug therapy: Secondary | ICD-10-CM | POA: Insufficient documentation

## 2021-11-21 NOTE — Patient Instructions (Signed)
Continue weighing daily and call for an overnight weight gain of 3 pounds or more or a weekly weight gain of more than 5 pounds  If you have voicemail, please make sure your mailbox is cleaned out so that we may leave a message and please make sure to listen to any voicemails.    If you receive a satisfaction survey regarding the [facility] Failure [facility], please take the time to fill it out. This way we can continue to provide excellent care and make any changes that need to be made.     

## 2021-11-21 NOTE — Progress Notes (Signed)
[facility] - HEART FAILURE CLINIC - PHARMACIST COUNSELING NOTE  Guideline-Directed Medical Therapy/Evidence Based Medicine  ACE/ARB/ARNI: Valsartan 80 mg daily Beta Blocker: Carvedilol 12.5 mg twice daily Aldosterone Antagonist: Spironolactone 12.5 mg daily Diuretic: Furosemide 20 mg PRN if weight > 231 lb SGLT2i: Empagliflozin 10 mg daily  Adherence Assessment  Do you ever forget to take your medication? [] Yes [x] No  Do you ever skip doses due to side effects? [] Yes [x] No  Do you have trouble affording your medicines? [] Yes [x] No  Are you ever unable to pick up your medication due to transportation difficulties? [] Yes [x] No  Do you ever stop taking your medications because you don't believe they are helping? [] Yes [x] No  Do you check your weight daily? [] Yes [x] No   Adherence strategy: associates with time of day  Barriers to obtaining medications: cost  Vital signs: HR 65, BP 105/66, weight (pounds) 234 lb ECHO: Date 03/2021, EF 45-50%, notes grade I diastolic dysfunction     Latest Ref Rng & Units 04/11/2021    5:23 AM 04/10/2021    3:56 AM 04/09/2021    8:06 AM  BMP  Glucose 70 - 99 mg/dL 161100  096102  99   BUN 8 - 23 mg/dL 13  16  15    Creatinine 0.44 - 1.00 mg/dL 0.450.91  4.090.77  8.110.81   Sodium 135 - 145 mmol/L 136  138  137   Potassium 3.5 - 5.1 mmol/L 4.2  3.7  3.4   Chloride 98 - 111 mmol/L 104  105  103   CO2 22 - 32 mmol/L 28  27  26    Calcium 8.9 - 10.3 mg/dL 8.8  9.1  9.0     Past Medical History:  Diagnosis Date   Carotid artery disease (HCC)    Congestive heart failure (HCC)    COPD (chronic obstructive pulmonary disease) (HCC)    Diabetes mellitus without complication (HCC)    pt denies, however is on metformin daily   HBP (high blood pressure)    Heart murmur     ASSESSMENT 71 year old female who presents to the HF clinic for follow up. On Jardiance, spironolactone, valsartan and carvedilol. Checking blood pressure at home with  systolics typically in 130s. Patient is concerned about cost of medications. Recently went into donut hole, resulting in cost of Jardiance ~$140 for 30 day supply. Inquires about grants and patient assistance opportunities.   PLAN [employer] [facility] is currently closed. Will investigate other grants for CHF and follow up with patient via telephone.  Patient Assistance Application for Jardiance initiated.  Reconciled medications  Time spent: 15 minutes  Elliot Gurneyaroline Anderson Cristin Penaflor, PharmD Pharmacy Resident  11/21/2021 12:37 PM  Current Outpatient Medications:    acetaminophen (TYLENOL) 500 MG tablet, Take 500 mg by mouth as needed., Disp: , Rfl:    albuterol (VENTOLIN HFA) 108 (90 Base) MCG/ACT inhaler, Inhale 1-2 puffs into the lungs every 6 (six) hours as needed for wheezing or shortness of breath., Disp: , Rfl:    ascorbic acid (VITAMIN C) 1000 MG tablet, Take 1,000 mg by mouth daily., Disp: , Rfl:    aspirin 81 MG tablet, Take 81 mg by mouth daily., Disp: , Rfl:    Calcium Carbonate-Vit D-Min (CALTRATE 600+D PLUS MINERALS) 600-800 MG-UNIT TABS, Take 1 tablet by mouth daily., Disp: , Rfl:    carvedilol (COREG) 12.5 MG tablet, Take 12.5 mg by mouth 2 (two) times daily with a meal. Reported on 10/08/2015, Disp: , Rfl:  cyclobenzaprine (FLEXERIL) 5 MG tablet, Take 5 mg by mouth at bedtime as needed., Disp: , Rfl:    empagliflozin (JARDIANCE) 10 MG TABS tablet, Take 1 tablet (10 mg total) by mouth daily before breakfast. (Patient not taking: Reported on 11/21/2021), Disp: 30 tablet, Rfl: 5   furosemide (LASIX) 20 MG tablet, Take 20 mg by mouth daily as needed. Takes 1 tablet (20 mg) if weight > 231 lb, Disp: , Rfl:    isosorbide mononitrate (IMDUR) 30 MG 24 hr tablet, Take 30 mg by mouth daily., Disp: , Rfl: 1   MAGNESIUM PO, Take 200 mg by mouth daily., Disp: , Rfl:    Multiple Vitamins-Minerals (CENTRUM SILVER PO), Take 1 tablet by mouth daily., Disp: , Rfl:    simvastatin (ZOCOR) 40 MG tablet,  Take 40 mg by mouth daily at 6 PM. , Disp: , Rfl:    spironolactone (ALDACTONE) 25 MG tablet, Take 0.5 tablets (12.5 mg total) by mouth daily. (Patient taking differently: Take 12.5 mg by mouth daily. Takes 1/2 tablet), Disp: 30 tablet, Rfl: 0   Tiotropium Bromide Monohydrate (SPIRIVA RESPIMAT) 2.5 MCG/ACT AERS, Inhale 2 puffs into the lungs daily., Disp: 4 g, Rfl: 11   valsartan (DIOVAN) 80 MG tablet, Take 1 tablet by mouth daily., Disp: , Rfl:    COUNSELING POINTS/CLINICAL PEARLS   DRUGS TO CAUTION IN HEART FAILURE  Drug or Class Mechanism  Analgesics NSAIDs COX-2 inhibitors Glucocorticoids  Sodium and water retention, increased systemic vascular resistance, decreased response to diuretics   Diabetes Medications Metformin Thiazolidinediones Rosiglitazone (Avandia) Pioglitazone (Actos) DPP4 Inhibitors Saxagliptin (Onglyza) Sitagliptin (Januvia)   Lactic acidosis Possible calcium channel blockade   Unknown  Antiarrhythmics Class I  Flecainide Disopyramide Class III Sotalol Other Dronedarone  Negative inotrope, proarrhythmic   Proarrhythmic, beta blockade  Negative inotrope  Antihypertensives Alpha Blockers Doxazosin Calcium Channel Blockers Diltiazem Verapamil Nifedipine Central Alpha Adrenergics Moxonidine Peripheral Vasodilators Minoxidil  Increases renin and aldosterone  Negative inotrope    Possible sympathetic withdrawal  Unknown  Anti-infective Itraconazole Amphotericin B  Negative inotrope Unknown  Hematologic Anagrelide Cilostazol   Possible inhibition of PD IV Inhibition of PD III causing arrhythmias  Neurologic/Psychiatric Stimulants Anti-Seizure Drugs Carbamazepine Pregabalin Antidepressants Tricyclics Citalopram Parkinsons Bromocriptine Pergolide Pramipexole Antipsychotics Clozapine Antimigraine Ergotamine Methysergide Appetite suppressants Bipolar Lithium  Peripheral alpha and beta agonist activity  Negative  inotrope and chronotrope Calcium channel blockade  Negative inotrope, proarrhythmic Dose-dependent QT prolongation  Excessive serotonin activity/valvular damage Excessive serotonin activity/valvular damage Unknown  IgE mediated hypersensitivy, calcium channel blockade  Excessive serotonin activity/valvular damage Excessive serotonin activity/valvular damage Valvular damage  Direct myofibrillar degeneration, adrenergic stimulation  Antimalarials Chloroquine Hydroxychloroquine Intracellular inhibition of lysosomal enzymes  Urologic Agents Alpha Blockers Doxazosin Prazosin Tamsulosin Terazosin  Increased renin and aldosterone  Adapted from Page Williemae Natter, et al. "Drugs That May Cause or Exacerbate Heart Failure: A Scientific Statement from the American Heart  Association." Circulation 2016; 134:e32-e69. DOI: 10.1161/CIR.0000000000000426   MEDICATION ADHERENCES TIPS AND STRATEGIES Taking medication as prescribed improves patient outcomes in heart failure (reduces hospitalizations, improves symptoms, increases survival) Side effects of medications can be managed by decreasing doses, switching agents, stopping drugs, or adding additional therapy. Please let someone in the Heart Failure Clinic know if you have having bothersome side effects so we can modify your regimen. Do not alter your medication regimen without talking to [place].  Medication reminders can help patients remember to take drugs on time. If you are missing or forgetting doses you can try linking behaviors, using  pill boxes, or an electronic reminder like an alarm on your phone or an app. Some people can also get automated phone calls as medication reminders.

## 2021-11-21 NOTE — Progress Notes (Signed)
Patient ID: Sarah [name], female    DOB: [birth year], 71 y.o.   MRN: [id number]   [name] is a 71 y/o female with a history of DM, HTN, COPD, previous tobacco use and chronic heart failure.   Echo report from 04/09/21 reviewed and showed an EF of 45-50% along with mild MR.   Has not been admitted or been in the ED in the last 6 months.   She presents today for a follow-up visit with a chief complaint of minimal fatigue upon moderate exertion. Describes this as chronic in nature. She has associated dizziness (with sudden position changes) and slight weigh gain along with this. She denies any difficulty sleeping, abdominal distention, palpitations, pedal edema, chest pain, shortness of breath or cough.   She has been out of jardiance for 3 days as she can not afford it.   Past Medical History:  Diagnosis Date   Carotid artery disease (HCC)    Congestive heart failure (HCC)    COPD (chronic obstructive pulmonary disease) (HCC)    Diabetes mellitus without complication (HCC)    pt denies, however is on metformin daily   HBP (high blood pressure)    Heart murmur    Past Surgical History:  Procedure Laterality Date   ABDOMINAL HYSTERECTOMY  1991   CARDIAC CATHETERIZATION  1999   COLONOSCOPY     ESOPHAGEAL DILATION N/A 02/11/2016   Procedure: ESOPHAGEAL DILATION;  Surgeon: Midge Minium, MD;  Location: [facility];  Service: Endoscopy;  Laterality: N/A;   ESOPHAGOGASTRODUODENOSCOPY (EGD) WITH PROPOFOL N/A 02/11/2016   Procedure: ESOPHAGOGASTRODUODENOSCOPY (EGD) WITH PROPOFOL;  Surgeon: Midge Minium, MD;  Location: [facility];  Service: Endoscopy;  Laterality: N/A;  DIABETIC   Family History  Problem Relation Age of Onset   Stroke Father    Hypertension Father    Hypertension Mother    Stroke Mother    Breast cancer Cousin        2 mat cousins   Breast cancer Cousin        pat cousin   Social History   Tobacco Use   Smoking status: Former    Packs/day: 0.50     Years: 27.00    Total pack years: 13.50    Types: Cigarettes    Quit date: 01/15/1996    Years since quitting: 25.8   Smokeless tobacco: Never  Substance Use Topics   Alcohol use: Yes    Alcohol/week: 0.0 standard drinks of alcohol    Comment: WINE WEEKLY   No Known Allergies  Prior to Admission medications   Medication Sig Start Date End Date Taking? Authorizing Provider  acetaminophen (TYLENOL) 500 MG tablet Take 500 mg by mouth as needed.   Yes [provider]  albuterol (VENTOLIN HFA) 108 (90 Base) MCG/ACT inhaler Inhale 1-2 puffs into the lungs every 6 (six) hours as needed for wheezing or shortness of breath.   Yes [provider]  ascorbic acid (VITAMIN C) 1000 MG tablet Take 1,000 mg by mouth daily.   Yes [provider]  aspirin 81 MG tablet Take 81 mg by mouth daily.   Yes [provider]  Calcium Carbonate-Vit D-Min (CALTRATE 600+D PLUS MINERALS) 600-800 MG-UNIT TABS Take 1 tablet by mouth daily.   Yes [provider]  carvedilol (COREG) 12.5 MG tablet Take 12.5 mg by mouth 2 (two) times daily with a meal. Reported on 10/08/2015   Yes [provider]  cyclobenzaprine (FLEXERIL) 5 MG tablet Take 5 mg by  mouth at bedtime as needed. 09/03/21  Yes [provider]  furosemide (LASIX) 20 MG tablet Take 20 mg by mouth daily as needed. Takes 1 tablet (20 mg) if weight > 231 lb   Yes [provider]  isosorbide mononitrate (IMDUR) 30 MG 24 hr tablet Take 30 mg by mouth daily. 01/13/18  Yes [provider]  MAGNESIUM PO Take 200 mg by mouth daily.   Yes [provider]  Multiple Vitamins-Minerals (CENTRUM SILVER PO) Take 1 tablet by mouth daily.   Yes [provider]  simvastatin (ZOCOR) 40 MG tablet Take 40 mg by mouth daily at 6 PM.    Yes [provider]  spironolactone (ALDACTONE) 25 MG tablet Take 0.5 tablets (12.5 mg total) by mouth daily. Patient taking differently: Take 12.5 mg  by mouth daily. Takes 1/2 tablet 04/12/21  Yes Wieting, Richard, MD  Tiotropium Bromide Monohydrate (SPIRIVA RESPIMAT) 2.5 MCG/ACT AERS Inhale 2 puffs into the lungs daily. 08/15/21  Yes Tyler Pita, MD  valsartan (DIOVAN) 80 MG tablet Take 1 tablet by mouth daily.   Yes [provider]  empagliflozin (JARDIANCE) 10 MG TABS tablet Take 1 tablet (10 mg total) by mouth daily before breakfast. Patient not taking: Reported on 11/21/2021 06/18/21   Alisa Graff, FNP   Review of Systems  Constitutional:  Positive for fatigue. Negative for appetite change.  HENT:  Negative for congestion, postnasal drip and sore throat.   Eyes: Negative.   Respiratory:  Negative for cough, chest tightness and shortness of breath.   Cardiovascular:  Negative for chest pain, palpitations and leg swelling.  Gastrointestinal:  Negative for abdominal distention and abdominal pain.  Endocrine: Negative.   Genitourinary: Negative.   Musculoskeletal:  Negative for back pain and neck pain.  Skin: Negative.   Allergic/Immunologic: Negative.   Neurological:  Positive for dizziness and light-headedness. Negative for syncope.  Hematological:  Negative for adenopathy. Does not bruise/bleed easily.  Psychiatric/Behavioral:  Negative for dysphoric mood and sleep disturbance (sleeping on 1 pillow). The patient is not nervous/anxious.    Vitals:   11/21/21 1120  BP: 105/66  Pulse: 65  Resp: 14  SpO2: 96%  Weight: 234 lb 1 oz (106.2 kg)  Height: 5\' 3"  (1.6 m)   Wt Readings from Last 3 Encounters:  11/21/21 234 lb 1 oz (106.2 kg)  08/20/21 229 lb 1 oz (103.9 kg)  08/15/21 230 lb 9.6 oz (104.6 kg)   Lab Results  Component Value Date   CREATININE 0.91 04/11/2021   CREATININE 0.77 04/10/2021   CREATININE 0.81 04/09/2021   Physical Exam Vitals and nursing note reviewed.  Constitutional:      General: She is not in acute distress.    Appearance: Normal appearance.  HENT:     Head: Normocephalic and  atraumatic.  Cardiovascular:     Rate and Rhythm: Normal rate and regular rhythm.  Pulmonary:     Effort: Pulmonary effort is normal. No respiratory distress.     Breath sounds: No wheezing or rales.  Abdominal:     General: There is no distension.     Palpations: Abdomen is soft.  Musculoskeletal:        General: No tenderness.     Cervical back: Normal range of motion and neck supple.     Right lower leg: No edema.     Left lower leg: No edema.  Skin:    General: Skin is warm and dry.  Neurological:  General: No focal deficit present.     Mental Status: She is alert and oriented to person, place, and time.  Psychiatric:        Mood and Affect: Mood normal.        Behavior: Behavior normal.        Thought Content: Thought content normal.   Assessment & Plan:  1: Chronic heart failure with reduced ejection fraction- - NYHA class II - euvolemic today - weighing daily; reminded to call for an overnight weight gain of > 2 pounds or a weekly weight gain of > 5 pounds - weight up 5 pounds from last visit here 3 months ago - taking her furosemide if weight gets >231 pounds - walking daily for ~ 10-15 minutes each day - not adding salt and is reading food labels for sodium content as well as serving size; she also rinses out any canned vegetables that she may use - saw cardiology Nehemiah Massed) 07/08/21  - on GDMT of carvedilol, valsartan & spironolactone - has been out of jardiance for 3 days due to cost - 1 month samples provided and patient assistance filled out for her today; if she does not get approval, consider changing to [place] and applying for patient assistance - BNP 04/08/21 was 223.9 - pharmD reconciled medicaitons  2: HTN- - BP looks good (105/66) - saw PCP Theda Sers) on 08/07/21 - BMP (from PCP office) 10/28/21 reviewed and showed sodium 142, potassium 4.5, creatinine 0.93 & GFR 675  3: COPD- - using spiriva - saw pulmonology Patsey Berthold) 08/15/21  4: DM- - A1c  10/28/21 was 5.8% - currently diet controlled.   Medication bottles reviewed.   Return in 3 months, sooner if needed

## 2021-11-22 ENCOUNTER — Telehealth: Payer: Self-pay

## 2021-11-22 NOTE — Telephone Encounter (Signed)
Called patient to discuss medication cost resources. No answer. Left voicemail. Will re-attempt Monday 6/12.

## 2021-11-26 ENCOUNTER — Other Ambulatory Visit: Payer: Self-pay | Admitting: Family

## 2021-11-26 DIAGNOSIS — E782 Mixed hyperlipidemia: Secondary | ICD-10-CM | POA: Diagnosis not present

## 2021-11-26 DIAGNOSIS — I2510 Atherosclerotic heart disease of native coronary artery without angina pectoris: Secondary | ICD-10-CM | POA: Diagnosis not present

## 2021-11-26 DIAGNOSIS — I5032 Chronic diastolic (congestive) heart failure: Secondary | ICD-10-CM | POA: Diagnosis not present

## 2021-11-26 DIAGNOSIS — I2584 Coronary atherosclerosis due to calcified coronary lesion: Secondary | ICD-10-CM | POA: Diagnosis not present

## 2021-11-26 DIAGNOSIS — I700 Atherosclerosis of aorta: Secondary | ICD-10-CM | POA: Diagnosis not present

## 2021-11-26 DIAGNOSIS — I10 Essential (primary) hypertension: Secondary | ICD-10-CM | POA: Diagnosis not present

## 2021-11-26 MED ORDER — EMPAGLIFLOZIN 10 MG PO TABS: 10.0000 mg | ORAL_TABLET | Freq: Every day | ORAL | 3 refills | Status: DC

## 2021-11-26 NOTE — Progress Notes (Signed)
Printed jardiance RX to send in with patient assistance application. If denied, may need to try farxiga

## 2021-11-29 ENCOUNTER — Encounter: Payer: Self-pay | Admitting: Gastroenterology

## 2021-11-29 DIAGNOSIS — R519 Headache, unspecified: Secondary | ICD-10-CM | POA: Diagnosis not present

## 2021-11-29 DIAGNOSIS — R110 Nausea: Secondary | ICD-10-CM | POA: Diagnosis not present

## 2021-11-29 DIAGNOSIS — Z87898 Personal history of other specified conditions: Secondary | ICD-10-CM | POA: Diagnosis not present

## 2021-11-29 NOTE — Anesthesia Preprocedure Evaluation (Addendum)
Anesthesia Evaluation  Patient identified by MRN, date of birth, ID band Patient awake    Reviewed: Allergy & Precautions, NPO status , Patient's Chart, lab work & pertinent test results, reviewed documented beta blocker date and time   History of Anesthesia Complications Negative for: history of anesthetic complications  Airway Mallampati: III  TM Distance: >3 FB Neck ROM: Limited    Dental  (+) Upper Dentures, Lower Dentures   Pulmonary COPD, former smoker,    breath sounds clear to auscultation       Cardiovascular hypertension, (-) angina+ CAD, + Peripheral Vascular Disease, +CHF and + DOE   Rhythm:Regular Rate:Normal   HLD  TTE (03/2021): LVEF 45-50% Grade I diastolic dysfunction Mild MR   Neuro/Psych    GI/Hepatic neg GERD  ,  Endo/Other  diabetes, Type 2  Renal/GU      Musculoskeletal  (+) Arthritis ,   Abdominal (+) + obese (BMI 41),   Peds  Hematology   Anesthesia Other Findings   Reproductive/Obstetrics                            Anesthesia Physical Anesthesia Plan  ASA: 3  Anesthesia Plan: General   Post-op Pain Management:    Induction: Intravenous  PONV Risk Score and Plan: 3 and Propofol infusion, TIVA and Treatment may vary due to age or medical condition  Airway Management Planned: Natural Airway and Nasal Cannula  Additional Equipment:   Intra-op Plan:   Post-operative Plan:   Informed Consent: I have reviewed the patients History and Physical, chart, labs and discussed the procedure including the risks, benefits and alternatives for the proposed anesthesia with the patient or authorized representative who has indicated his/her understanding and acceptance.       Plan Discussed with: CRNA and Anesthesiologist  Anesthesia Plan Comments:         Anesthesia Quick Evaluation

## 2021-12-03 ENCOUNTER — Telehealth: Payer: Self-pay | Admitting: Family

## 2021-12-03 NOTE — Telephone Encounter (Signed)
Patient called [place] out of concern for her jardiance application we submitted stating she received a letter in the mail stating they are not able to process application due to missing income. I assured patient we refaxed application on 6/13 and again today 6/20 with the income patient had provided and disregard that first letter and I will let her know if I hear anything.   Conroy Goracke, NT

## 2021-12-09 ENCOUNTER — Ambulatory Visit: Payer: PPO | Admitting: Anesthesiology

## 2021-12-09 ENCOUNTER — Ambulatory Visit
Admission: RE | Admit: 2021-12-09 | Discharge: 2021-12-09 | Disposition: A | Discharge status: Home / Self Care | Payer: PPO | Attending: Gastroenterology | Admitting: Gastroenterology

## 2021-12-09 ENCOUNTER — Encounter: Payer: Self-pay | Admitting: Gastroenterology

## 2021-12-09 ENCOUNTER — Encounter
Admission: RE | Disposition: A | Discharge status: Home / Self Care | Payer: Self-pay | Source: Home / Self Care | Attending: Gastroenterology

## 2021-12-09 ENCOUNTER — Other Ambulatory Visit: Payer: Self-pay

## 2021-12-09 DIAGNOSIS — I110 Hypertensive heart disease with heart failure: Secondary | ICD-10-CM | POA: Diagnosis not present

## 2021-12-09 DIAGNOSIS — K641 Second degree hemorrhoids: Secondary | ICD-10-CM | POA: Diagnosis not present

## 2021-12-09 DIAGNOSIS — Z1211 Encounter for screening for malignant neoplasm of colon: Secondary | ICD-10-CM | POA: Insufficient documentation

## 2021-12-09 DIAGNOSIS — J449 Chronic obstructive pulmonary disease, unspecified: Secondary | ICD-10-CM | POA: Diagnosis not present

## 2021-12-09 DIAGNOSIS — Z87891 Personal history of nicotine dependence: Secondary | ICD-10-CM | POA: Diagnosis not present

## 2021-12-09 DIAGNOSIS — Z6841 Body Mass Index (BMI) 40.0 and over, adult: Secondary | ICD-10-CM | POA: Insufficient documentation

## 2021-12-09 DIAGNOSIS — E1151 Type 2 diabetes mellitus with diabetic peripheral angiopathy without gangrene: Secondary | ICD-10-CM | POA: Diagnosis not present

## 2021-12-09 DIAGNOSIS — E785 Hyperlipidemia, unspecified: Secondary | ICD-10-CM

## 2021-12-09 DIAGNOSIS — E669 Obesity, unspecified: Secondary | ICD-10-CM | POA: Insufficient documentation

## 2021-12-09 DIAGNOSIS — I509 Heart failure, unspecified: Secondary | ICD-10-CM | POA: Diagnosis not present

## 2021-12-09 DIAGNOSIS — R0600 Dyspnea, unspecified: Secondary | ICD-10-CM | POA: Insufficient documentation

## 2021-12-09 DIAGNOSIS — I2510 Atherosclerotic heart disease of native coronary artery without angina pectoris: Secondary | ICD-10-CM | POA: Insufficient documentation

## 2021-12-09 HISTORY — PX: COLONOSCOPY WITH PROPOFOL: SHX5780

## 2021-12-09 SURGERY — COLONOSCOPY WITH PROPOFOL
Anesthesia: General | Site: Rectum

## 2021-12-09 MED ORDER — PROPOFOL 10 MG/ML IV BOLUS
INTRAVENOUS | Status: DC | PRN
  Administered 2021-12-09: 40 mg via INTRAVENOUS
  Administered 2021-12-09: 140 mg via INTRAVENOUS
  Administered 2021-12-09: 40 mg via INTRAVENOUS

## 2021-12-09 MED ORDER — SODIUM CHLORIDE 0.9 % IV SOLN: INTRAVENOUS | Status: DC

## 2021-12-09 MED ORDER — STERILE WATER FOR IRRIGATION IR SOLN
Status: DC | PRN
  Administered 2021-12-09: 1

## 2021-12-09 MED ORDER — LACTATED RINGERS IV SOLN: INTRAVENOUS | Status: DC

## 2021-12-09 SURGICAL SUPPLY — 22 items

## 2021-12-09 NOTE — H&P (Signed)
Sarah Minium, MD [facility] [address] [place], [place] [address] Phone: [phone] Fax : [phone]  Primary Care Physician:  Irena Reichmann, DO Primary Gastroenterologist:  Dr. Servando Snare  Pre-Procedure History & Physical: HPI:  Sarah [name] is a 71 y.o. female is here for a screening colonoscopy.   Past Medical History:  Diagnosis Date   Carotid artery disease (HCC)    Congestive heart failure (HCC)    COPD (chronic obstructive pulmonary disease) (HCC)    Diabetes mellitus without complication (HCC)    pt denies, however is on metformin daily   HBP (high blood pressure)    Heart murmur     Past Surgical History:  Procedure Laterality Date   ABDOMINAL HYSTERECTOMY  1991   CARDIAC CATHETERIZATION  1999   COLONOSCOPY     ESOPHAGEAL DILATION N/A 02/11/2016   Procedure: ESOPHAGEAL DILATION;  Surgeon: Sarah Minium, MD;  Location: [facility];  Service: Endoscopy;  Laterality: N/A;   ESOPHAGOGASTRODUODENOSCOPY (EGD) WITH PROPOFOL N/A 02/11/2016   Procedure: ESOPHAGOGASTRODUODENOSCOPY (EGD) WITH PROPOFOL;  Surgeon: Sarah Minium, MD;  Location: [facility];  Service: Endoscopy;  Laterality: N/A;  DIABETIC    Prior to Admission medications   Medication Sig Start Date End Date Taking? Authorizing Provider  acetaminophen (TYLENOL) 500 MG tablet Take 500 mg by mouth as needed.   Yes [provider]  albuterol (VENTOLIN HFA) 108 (90 Base) MCG/ACT inhaler Inhale 1-2 puffs into the lungs every 6 (six) hours as needed for wheezing or shortness of breath.   Yes [provider]  ascorbic acid (VITAMIN C) 1000 MG tablet Take 1,000 mg by mouth daily.   Yes [provider]  aspirin 81 MG tablet Take 81 mg by mouth daily.   Yes [provider]  Calcium Carbonate-Vit D-Min (CALTRATE 600+D PLUS MINERALS) 600-800 MG-UNIT TABS Take 1 tablet by mouth daily.   Yes [provider]  carvedilol (COREG) 12.5 MG tablet Take 12.5 mg by mouth 2  (two) times daily with a meal. Reported on 10/08/2015   Yes [provider]  cyclobenzaprine (FLEXERIL) 5 MG tablet Take 5 mg by mouth at bedtime as needed. 09/03/21  Yes [provider]  empagliflozin (JARDIANCE) 10 MG TABS tablet Take 1 tablet (10 mg total) by mouth daily before breakfast. 11/26/21  Yes Clarisa Kindred A, FNP  furosemide (LASIX) 20 MG tablet Take 20 mg by mouth daily as needed. Takes 1 tablet (20 mg) if weight > 231 lb   Yes [provider]  isosorbide mononitrate (IMDUR) 30 MG 24 hr tablet Take 30 mg by mouth daily. 01/13/18  Yes [provider]  MAGNESIUM PO Take 200 mg by mouth daily.   Yes [provider]  Multiple Vitamins-Minerals (CENTRUM SILVER PO) Take 1 tablet by mouth daily.   Yes [provider]  simvastatin (ZOCOR) 40 MG tablet Take 40 mg by mouth daily at 6 PM.    Yes [provider]  Tiotropium Bromide Monohydrate (SPIRIVA RESPIMAT) 2.5 MCG/ACT AERS Inhale 2 puffs into the lungs daily. 08/15/21  Yes Salena Saner, MD  valsartan (DIOVAN) 80 MG tablet Take 1 tablet by mouth daily.   Yes [provider]  spironolactone (ALDACTONE) 25 MG tablet Take 0.5 tablets (12.5 mg total) by mouth daily. Patient taking differently: Take 12.5 mg by mouth daily. Takes 1/2 tablet 04/12/21   Alford Highland, MD    Allergies as of 11/19/2021   (No Known Allergies)    Family History  Problem Relation Age of Onset   Stroke Father    Hypertension Father    Hypertension Mother    Stroke Mother    Breast cancer Cousin        2 mat cousins   Breast cancer Cousin        pat cousin    Social History   Socioeconomic History   Marital status: Widowed    Spouse name: Not on file   Number of children: 3   Years of education: 18   Highest education level: Not on file  Occupational History   Occupation: Human resources officer  Tobacco Use   Smoking status: Former    Packs/day: 0.50    Years: 27.00     Total pack years: 13.50    Types: Cigarettes    Quit date: 01/15/1996    Years since quitting: 25.9   Smokeless tobacco: Never  Vaping Use   Vaping Use: Never used  Substance and Sexual Activity   Alcohol use: Yes    Alcohol/week: 0.0 standard drinks of alcohol    Comment: WINE WEEKLY   Drug use: No   Sexual activity: Not on file  Other Topics Concern   Not on file  Social History Narrative   Not on file   Social Determinants of Health   Financial Resource Strain: Not on file  Food Insecurity: Not on file  Transportation Needs: Not on file  Physical Activity: Not on file  Stress: Not on file  Social Connections: Not on file  Intimate Partner Violence: Not on file    Review of Systems: See HPI, otherwise negative ROS  Physical Exam: BP (!) 111/52   Pulse 67   Temp (!) 97.3 F (36.3 C) (Temporal)   Ht 5\' 3"  (1.6 m)   Wt 103.4 kg   SpO2 97%   BMI 40.39 kg/m  General:   Alert,  pleasant and cooperative in NAD Head:  Normocephalic and atraumatic. Neck:  Supple; no masses or thyromegaly. Lungs:  Clear throughout to auscultation.    Heart:  Regular rate and rhythm. Abdomen:  Soft, nontender and nondistended. Normal bowel sounds, without guarding, and without rebound.   Neurologic:  Alert and  oriented x4;  grossly normal neurologically.  Impression/Plan: Sarah [name] is now here to undergo a screening colonoscopy.  Risks, benefits, and alternatives regarding colonoscopy have been reviewed with the patient.  Questions have been answered.  All parties agreeable.

## 2021-12-10 ENCOUNTER — Encounter: Payer: Self-pay | Admitting: Gastroenterology

## 2021-12-12 DIAGNOSIS — Z87898 Personal history of other specified conditions: Secondary | ICD-10-CM | POA: Diagnosis not present

## 2021-12-12 DIAGNOSIS — Z09 Encounter for follow-up examination after completed treatment for conditions other than malignant neoplasm: Secondary | ICD-10-CM | POA: Diagnosis not present

## 2021-12-20 ENCOUNTER — Telehealth: Payer: Self-pay | Admitting: Family

## 2021-12-20 NOTE — Telephone Encounter (Signed)
Unable to Reach patient in attempt to let her know she has been approved for Jardiance patient assistance until 06/15/22 and her first shipment of jardiance should arrive in the next 7 to 10 business days.   Sussan Meter, NT

## 2021-12-23 ENCOUNTER — Telehealth: Payer: Self-pay | Admitting: Family

## 2022-01-14 DIAGNOSIS — M79672 Pain in left foot: Secondary | ICD-10-CM | POA: Diagnosis not present

## 2022-01-14 DIAGNOSIS — M47816 Spondylosis without myelopathy or radiculopathy, lumbar region: Secondary | ICD-10-CM | POA: Diagnosis not present

## 2022-01-14 DIAGNOSIS — M79651 Pain in right thigh: Secondary | ICD-10-CM | POA: Diagnosis not present

## 2022-01-14 DIAGNOSIS — R079 Chest pain, unspecified: Secondary | ICD-10-CM | POA: Diagnosis not present

## 2022-01-16 ENCOUNTER — Other Ambulatory Visit: Payer: Self-pay | Admitting: Family Medicine

## 2022-01-16 DIAGNOSIS — M4800 Spinal stenosis, site unspecified: Secondary | ICD-10-CM | POA: Diagnosis not present

## 2022-01-16 DIAGNOSIS — I509 Heart failure, unspecified: Secondary | ICD-10-CM | POA: Diagnosis not present

## 2022-01-16 DIAGNOSIS — J449 Chronic obstructive pulmonary disease, unspecified: Secondary | ICD-10-CM | POA: Diagnosis not present

## 2022-01-16 DIAGNOSIS — Z1231 Encounter for screening mammogram for malignant neoplasm of breast: Secondary | ICD-10-CM

## 2022-01-17 ENCOUNTER — Telehealth: Payer: Self-pay | Admitting: Pulmonary Disease

## 2022-01-17 MED ORDER — SPIRIVA RESPIMAT 2.5 MCG/ACT IN AERS: 2.0000 | INHALATION_SPRAY | Freq: Every day | RESPIRATORY_TRACT | 0 refills | Status: DC

## 2022-01-17 NOTE — Telephone Encounter (Signed)
Patient dropped off BI patient assistant form. Form has been completed and faxed to [facility]. Nothing further needed.

## 2022-01-17 NOTE — Telephone Encounter (Signed)
One sample of spiriva 2.5 has been placed up front for pickup. Patient is aware and voiced her understanding.  Nothing further needed.

## 2022-01-28 DIAGNOSIS — Z1211 Encounter for screening for malignant neoplasm of colon: Secondary | ICD-10-CM | POA: Diagnosis not present

## 2022-01-28 DIAGNOSIS — R7309 Other abnormal glucose: Secondary | ICD-10-CM | POA: Diagnosis not present

## 2022-01-28 DIAGNOSIS — E7800 Pure hypercholesterolemia, unspecified: Secondary | ICD-10-CM | POA: Diagnosis not present

## 2022-01-28 DIAGNOSIS — R946 Abnormal results of thyroid function studies: Secondary | ICD-10-CM | POA: Diagnosis not present

## 2022-01-28 DIAGNOSIS — I5030 Unspecified diastolic (congestive) heart failure: Secondary | ICD-10-CM | POA: Diagnosis not present

## 2022-01-28 DIAGNOSIS — I10 Essential (primary) hypertension: Secondary | ICD-10-CM | POA: Diagnosis not present

## 2022-01-28 DIAGNOSIS — E559 Vitamin D deficiency, unspecified: Secondary | ICD-10-CM | POA: Diagnosis not present

## 2022-02-04 DIAGNOSIS — Z0000 Encounter for general adult medical examination without abnormal findings: Secondary | ICD-10-CM | POA: Diagnosis not present

## 2022-02-04 DIAGNOSIS — I10 Essential (primary) hypertension: Secondary | ICD-10-CM | POA: Diagnosis not present

## 2022-02-04 DIAGNOSIS — E7800 Pure hypercholesterolemia, unspecified: Secondary | ICD-10-CM | POA: Diagnosis not present

## 2022-02-04 DIAGNOSIS — R7309 Other abnormal glucose: Secondary | ICD-10-CM | POA: Diagnosis not present

## 2022-02-04 DIAGNOSIS — E559 Vitamin D deficiency, unspecified: Secondary | ICD-10-CM | POA: Diagnosis not present

## 2022-02-04 DIAGNOSIS — M47816 Spondylosis without myelopathy or radiculopathy, lumbar region: Secondary | ICD-10-CM | POA: Diagnosis not present

## 2022-02-04 DIAGNOSIS — T753XXA Motion sickness, initial encounter: Secondary | ICD-10-CM | POA: Diagnosis not present

## 2022-02-04 DIAGNOSIS — I5030 Unspecified diastolic (congestive) heart failure: Secondary | ICD-10-CM | POA: Diagnosis not present

## 2022-02-10 ENCOUNTER — Ambulatory Visit
Admission: EM | Admit: 2022-02-10 | Discharge: 2022-02-10 | Disposition: A | Discharge status: Home / Self Care | Payer: PPO | Attending: Family Medicine | Admitting: Family Medicine

## 2022-02-10 ENCOUNTER — Ambulatory Visit
Admission: RE | Admit: 2022-02-10 | Discharge: 2022-02-10 | Disposition: A | Discharge status: Home / Self Care | Payer: PPO | Source: Ambulatory Visit | Attending: Family Medicine | Admitting: Family Medicine

## 2022-02-10 DIAGNOSIS — L03119 Cellulitis of unspecified part of limb: Secondary | ICD-10-CM | POA: Insufficient documentation

## 2022-02-10 DIAGNOSIS — R238 Other skin changes: Secondary | ICD-10-CM | POA: Diagnosis not present

## 2022-02-10 DIAGNOSIS — Z1231 Encounter for screening mammogram for malignant neoplasm of breast: Secondary | ICD-10-CM | POA: Insufficient documentation

## 2022-02-10 MED ORDER — CEPHALEXIN 500 MG PO CAPS: 500.0000 mg | ORAL_CAPSULE | Freq: Four times a day (QID) | ORAL | 0 refills | Status: AC

## 2022-02-10 MED ORDER — VALACYCLOVIR HCL 1 G PO TABS: 1000.0000 mg | ORAL_TABLET | Freq: Three times a day (TID) | ORAL | 0 refills | Status: AC

## 2022-02-10 NOTE — Discharge Instructions (Addendum)
-  We are testing the fluid from the blisters to see if it is the shingles virus.  I have already sent antiviral medicines to pharmacy to cover for that possibility as it does look characteristic of shingles. - I am also concerned about bacterial infection so I have sent antibiotics to pharmacy. - If you do have shingles you should avoid being around anyone who is immunocompromised, young children, and pregnant women.  Shingles usually heals within 7 to 10 days. - You can have Tylenol for pain and discomfort.  Make sure to keep the area clean and dry.  Try to keep it covered when you go out. -Return or go to ER for acute worsening of symptoms.

## 2022-02-10 NOTE — ED Provider Notes (Signed)
[facility]    CSN: [id number] Arrival date & time: 02/10/22  1133      History   Chief Complaint Chief Complaint  Patient presents with   Rash    HPI Sarah [name] is a 71 y.o. female presenting for pustular and vesicular rash of the right hand for the past 3 days.  She says it is painful.  She has been cleaning it with hydrogen peroxide.  Denies any known triggers.  No history of eczema or skin rashes or recurrent skin infections.  No associated fevers. Denies ever receiving shingles vaccine.  Medical history significant for COPD, CAD, CHF, diabetes, hypertension, obesity, and hyperlipidemia.  HPI  Past Medical History:  Diagnosis Date   Carotid artery disease (HCC)    Congestive heart failure (HCC)    COPD (chronic obstructive pulmonary disease) (HCC)    Diabetes mellitus without complication (HCC)    pt denies, however is on metformin daily   HBP (high blood pressure)    Heart murmur     Patient Active Problem List   Diagnosis Date Noted   Colon cancer screening    Impaired fasting glucose    Acute pain of right shoulder    Acute on chronic combined systolic and diastolic CHF (congestive heart failure) (HCC) 04/08/2021   Diabetes mellitus without complication (HCC)    COPD (chronic obstructive pulmonary disease) (HCC)    HLD (hyperlipidemia)    CAD (coronary artery disease)    Chest pain    Osteoarthritis of knee 07/09/2017   Patellofemoral stress syndrome 07/09/2017   Venous insufficiency of both lower extremities 02/11/2017   Chronic diastolic CHF (congestive heart failure), NYHA class 2 (HCC) 05/12/2016   Controlled type 2 diabetes mellitus without complication, without long-term current use of insulin (HCC) 05/12/2016   Essential hypertension 04/04/2016   Mixed hyperlipidemia 04/04/2016   Other diseases of stomach and duodenum    Problems with swallowing and mastication    Left buttock abscess 12/15/2014   DOE (dyspnea on exertion)  06/05/2014   COPD type A (HCC) 06/05/2014   Morbid obesity with BMI of 40.0-44.9, adult (HCC) 06/05/2014    Past Surgical History:  Procedure Laterality Date   ABDOMINAL HYSTERECTOMY  1991   CARDIAC CATHETERIZATION  1999   COLONOSCOPY     COLONOSCOPY WITH PROPOFOL N/A 12/09/2021   Procedure: COLONOSCOPY WITH PROPOFOL;  Surgeon: Midge Minium, MD;  Location: [facility];  Service: Endoscopy;  Laterality: N/A;   ESOPHAGEAL DILATION N/A 02/11/2016   Procedure: ESOPHAGEAL DILATION;  Surgeon: Midge Minium, MD;  Location: [facility];  Service: Endoscopy;  Laterality: N/A;   ESOPHAGOGASTRODUODENOSCOPY (EGD) WITH PROPOFOL N/A 02/11/2016   Procedure: ESOPHAGOGASTRODUODENOSCOPY (EGD) WITH PROPOFOL;  Surgeon: Midge Minium, MD;  Location: [facility];  Service: Endoscopy;  Laterality: N/A;  DIABETIC    OB History     Gravida  3   Para  3   Term      Preterm      AB      Living         SAB      IAB      Ectopic      Multiple      Live Births           Obstetric Comments  1st Menstrual Cycle:  13  1st Pregnancy:  17          Home Medications    Prior to Admission medications   Medication Sig Start  Date End Date Taking? Authorizing Provider  acetaminophen (TYLENOL) 500 MG tablet Take 500 mg by mouth as needed.   Yes [provider]  albuterol (VENTOLIN HFA) 108 (90 Base) MCG/ACT inhaler Inhale 1-2 puffs into the lungs every 6 (six) hours as needed for wheezing or shortness of breath.   Yes [provider]  ascorbic acid (VITAMIN C) 1000 MG tablet Take 1,000 mg by mouth daily.   Yes [provider]  aspirin 81 MG tablet Take 81 mg by mouth daily.   Yes [provider]  Calcium Carbonate-Vit D-Min (CALTRATE 600+D PLUS MINERALS) 600-800 MG-UNIT TABS Take 1 tablet by mouth daily.   Yes [provider]  carvedilol (COREG) 12.5 MG tablet Take 12.5 mg by mouth 2 (two) times daily with a meal. Reported on  10/08/2015   Yes [provider]  cephALEXin (KEFLEX) 500 MG capsule Take 1 capsule (500 mg total) by mouth 4 (four) times daily for 7 days. 02/10/22 02/17/22 Yes Shirlee Latch, PA-C  cyclobenzaprine (FLEXERIL) 5 MG tablet Take 5 mg by mouth at bedtime as needed. 09/03/21  Yes [provider]  empagliflozin (JARDIANCE) 10 MG TABS tablet Take 1 tablet (10 mg total) by mouth daily before breakfast. 11/26/21  Yes Clarisa Kindred A, FNP  furosemide (LASIX) 20 MG tablet Take 20 mg by mouth daily as needed. Takes 1 tablet (20 mg) if weight > 231 lb   Yes [provider]  isosorbide mononitrate (IMDUR) 30 MG 24 hr tablet Take 30 mg by mouth daily. 01/13/18  Yes [provider]  MAGNESIUM PO Take 200 mg by mouth daily.   Yes [provider]  Multiple Vitamins-Minerals (CENTRUM SILVER PO) Take 1 tablet by mouth daily.   Yes [provider]  simvastatin (ZOCOR) 40 MG tablet Take 40 mg by mouth daily at 6 PM.    Yes [provider]  spironolactone (ALDACTONE) 25 MG tablet Take 0.5 tablets (12.5 mg total) by mouth daily. Patient taking differently: Take 12.5 mg by mouth daily. Takes 1/2 tablet 04/12/21  Yes Wieting, Richard, MD  Tiotropium Bromide Monohydrate (SPIRIVA RESPIMAT) 2.5 MCG/ACT AERS Inhale 2 puffs into the lungs daily. 08/15/21  Yes Salena Saner, MD  Tiotropium Bromide Monohydrate (SPIRIVA RESPIMAT) 2.5 MCG/ACT AERS Inhale 2 puffs into the lungs daily. 01/17/22  Yes Salena Saner, MD  valACYclovir (VALTREX) 1000 MG tablet Take 1 tablet (1,000 mg total) by mouth 3 (three) times daily for 7 days. 02/10/22 02/17/22 Yes Eusebio Friendly B, PA-C  valsartan (DIOVAN) 80 MG tablet Take 1 tablet by mouth daily.   Yes [provider]    Family History Family History  Problem Relation Age of Onset   Stroke Father    Hypertension Father    Hypertension Mother    Stroke Mother    Breast cancer Cousin        2 mat cousins   Breast cancer  Cousin        pat cousin    Social History Social History   Tobacco Use   Smoking status: Former    Packs/day: 0.50    Years: 27.00    Total pack years: 13.50    Types: Cigarettes    Quit date: 01/15/1996    Years since quitting: 26.0   Smokeless tobacco: Never  Vaping Use   Vaping Use: Never used  Substance Use Topics   Alcohol use: Yes    Alcohol/week: 0.0 standard drinks of alcohol  Comment: WINE WEEKLY   Drug use: No     Allergies   Patient has no known allergies.   Review of Systems Review of Systems  Constitutional:  Negative for fatigue and fever.  Musculoskeletal:  Negative for arthralgias and joint swelling.  Skin:  Positive for rash.     Physical Exam Triage Vital Signs ED Triage Vitals  Enc Vitals Group     BP      Pulse      Resp      Temp      Temp src      SpO2      Weight      Height      Head Circumference      Peak Flow      Pain Score      Pain Loc      Pain Edu?      Excl. in GC?    No data found.  Updated Vital Signs BP 102/75 (BP Location: Left Arm)   Pulse 66   Temp 98.3 F (36.8 C) (Oral)   Resp 18   Ht 5\' 3"  (1.6 m)   Wt 234 lb (106.1 kg)   SpO2 97%   BMI 41.45 kg/m      Physical Exam Vitals and nursing note reviewed.  Constitutional:      General: She is not in acute distress.    Appearance: Normal appearance. She is not ill-appearing or toxic-appearing.  HENT:     Head: Normocephalic and atraumatic.  Eyes:     General: No scleral icterus.       Right eye: No discharge.        Left eye: No discharge.     Conjunctiva/sclera: Conjunctivae normal.  Cardiovascular:     Rate and Rhythm: Normal rate and regular rhythm.     Heart sounds: Normal heart sounds.  Pulmonary:     Effort: Pulmonary effort is normal. No respiratory distress.     Breath sounds: Normal breath sounds.  Musculoskeletal:     Cervical back: Neck supple.  Skin:    General: Skin is dry.     Findings: Rash present.     Comments: Multiple  pustules and vesicles with erythema and swelling of the fourth palmar digit extending to the palm with additional area of rash of the fifth digit.  Neurological:     General: No focal deficit present.     Mental Status: She is alert. Mental status is at baseline.     Motor: No weakness.     Gait: Gait normal.  Psychiatric:        Mood and Affect: Mood normal.        Behavior: Behavior normal.        Thought Content: Thought content normal.         UC Treatments / Results  Labs (all labs ordered are listed, but only abnormal results are displayed) Labs Reviewed  VARICELLA-ZOSTER BY PCR  VARICELLA-ZOSTER BY PCR    EKG   Radiology No results found.  Procedures Procedures (including critical care time)  Medications Ordered in UC Medications - No data to display  Initial Impression / Assessment and Plan / UC Course  I have reviewed the triage vital signs and the nursing notes.  Pertinent labs & imaging results that were available during my care of the patient were reviewed by me and considered in my medical decision making (see chart for details).   71 year old female with history of  type 2 diabetes, obesity, hypertension, CAD, CHF, and COPD presents for rash of right hand for the past 3 days.  Vitals are stable.  Images included in chart of patient's rash but she has multiple vesicles and pustules with erythema and swelling of the fourth digit of the right hand mostly the rash also affects the palm and fifth digit.  It is tender to palpation.  Clinical condition may be consistent with shingles versus bacterial infection.  Varicella-zoster test obtained via vesicle fluid.  Advised patient we will call with the results but we will go ahead and treat her at this time with Valtrex given the possibility of shingles and also Keflex for potential bacterial infection.  Advised very close monitoring and to return or go to ER for acute worsening of symptoms.   Final Clinical  Impressions(s) / UC Diagnoses   Final diagnoses:  Vesicular rash  Cellulitis of hand     Discharge Instructions      -We are testing the fluid from the blisters to see if it is the shingles virus.  I have already sent antiviral medicines to pharmacy to cover for that possibility as it does look characteristic of shingles. - I am also concerned about bacterial infection so I have sent antibiotics to pharmacy. - If you do have shingles you should avoid being around anyone who is immunocompromised, young children, and pregnant women.  Shingles usually heals within 7 to 10 days. - You can have Tylenol for pain and discomfort.  Make sure to keep the area clean and dry.  Try to keep it covered when you go out. -Return or go to ER for acute worsening of symptoms.     ED Prescriptions     Medication Sig Dispense Auth. Provider   valACYclovir (VALTREX) 1000 MG tablet Take 1 tablet (1,000 mg total) by mouth 3 (three) times daily for 7 days. 21 tablet Eusebio Friendly B, PA-C   cephALEXin (KEFLEX) 500 MG capsule Take 1 capsule (500 mg total) by mouth 4 (four) times daily for 7 days. 28 capsule Shirlee Latch, PA-C      PDMP not reviewed this encounter.   Shirlee Latch, PA-C 02/10/22 1224

## 2022-02-10 NOTE — ED Triage Notes (Signed)
Pt c/o rash along right hand x3days.  Pt is c/o about discoloration along joints of 4th finger, palm, and between fingers.   Pt has used hydrogen peroxide, OTC anti-itch cream, and neosporin.   Pt states that it itched at first but has changed into a burning sensation.

## 2022-02-14 LAB — VARICELLA-ZOSTER BY PCR: Varicella-Zoster, PCR: NEGATIVE

## 2022-02-21 ENCOUNTER — Ambulatory Visit: Payer: PPO | Admitting: Family

## 2022-03-04 ENCOUNTER — Telehealth: Payer: Self-pay | Admitting: Pulmonary Disease

## 2022-03-05 MED ORDER — SPIRIVA RESPIMAT 2.5 MCG/ACT IN AERS: 2.0000 | INHALATION_SPRAY | Freq: Every day | RESPIRATORY_TRACT | 3 refills | Status: DC

## 2022-03-05 NOTE — Telephone Encounter (Signed)
Spoke with the pt  She needs spiriva 2.5 mcg sent to [facility]  Fax number [phone]  Rx signed and faxed  Nothing further needed

## 2022-03-07 ENCOUNTER — Other Ambulatory Visit
Admission: RE | Admit: 2022-03-07 | Discharge: 2022-03-07 | Disposition: A | Discharge status: Home / Self Care | Payer: PPO | Source: Ambulatory Visit | Attending: Family | Admitting: Family

## 2022-03-07 ENCOUNTER — Ambulatory Visit (HOSPITAL_BASED_OUTPATIENT_CLINIC_OR_DEPARTMENT_OTHER): Payer: PPO | Admitting: Family

## 2022-03-07 ENCOUNTER — Encounter: Payer: Self-pay | Admitting: Family

## 2022-03-07 VITALS — BP 104/68 | HR 68 | Resp 14 | Ht 63.0 in | Wt 236.4 lb

## 2022-03-07 DIAGNOSIS — Z87891 Personal history of nicotine dependence: Secondary | ICD-10-CM | POA: Insufficient documentation

## 2022-03-07 DIAGNOSIS — E119 Type 2 diabetes mellitus without complications: Secondary | ICD-10-CM | POA: Insufficient documentation

## 2022-03-07 DIAGNOSIS — I5022 Chronic systolic (congestive) heart failure: Secondary | ICD-10-CM | POA: Insufficient documentation

## 2022-03-07 DIAGNOSIS — J449 Chronic obstructive pulmonary disease, unspecified: Secondary | ICD-10-CM | POA: Insufficient documentation

## 2022-03-07 DIAGNOSIS — I110 Hypertensive heart disease with heart failure: Secondary | ICD-10-CM | POA: Insufficient documentation

## 2022-03-07 LAB — BASIC METABOLIC PANEL
Anion gap: 8 (ref 5–15)
BUN: 20 mg/dL (ref 8–23)
CO2: 25 mmol/L (ref 22–32)
Calcium: 9.2 mg/dL (ref 8.9–10.3)
Chloride: 106 mmol/L (ref 98–111)
Creatinine, Ser: 1 mg/dL (ref 0.44–1.00)
GFR, Estimated: >60 mL/min (ref >60)
Glucose, Bld: 90 mg/dL (ref 70–99)
Potassium: 4 mmol/L (ref 3.5–5.1)
Sodium: 139 mmol/L (ref 135–145)

## 2022-03-07 NOTE — Progress Notes (Signed)
Patient ID: Sarah [name], female    DOB: 07-Oct-1950, 71 y.o.   MRN: [id number]   Ms Marcott is a 71 y/o female with a history of DM, HTN, COPD, previous tobacco use and chronic heart failure.   Echo report from 04/09/21 reviewed and showed an EF of 45-50% along with mild MR.   Was at Urgent Care 02/10/22 due to pustular and vesicular rash of the right hand.Given valtrex & keflex along with varicella-zoster test and she was released.   She presents today for a follow-up visit with a chief complaint of minimal fatigue upon moderate exertion. Describes this as chronic in nature having been present for several years. She has associated occasional dizziness and slight weight gain along with this. She denies any difficulty sleeping, abdominal distention, palpitations, pedal edema, chest pain, shortness of breath or cough.   Has been taking 20mg  furosemide daily this week due to weight going >231 pounds. Experienced an episode of chest tightness earlier this week and she took 40mg  furosemide which relieved the tightness and she hasn't had that occur since.   Continues to have a rash on her hands but says that it's markedly improved and is now drying up and peeling.   She will be going on a cruise to the [place] early October and is looking forward to that. Is planning on getting her flu vaccine once she returns  Past Medical History:  Diagnosis Date   Carotid artery disease (HCC)    Congestive heart failure (HCC)    COPD (chronic obstructive pulmonary disease) ([place])    Diabetes mellitus without complication ([place])    pt denies, however is on metformin daily   HBP (high blood pressure)    Heart murmur    Past Surgical History:  Procedure Laterality Date   ABDOMINAL HYSTERECTOMY  1991   CARDIAC CATHETERIZATION  1999   COLONOSCOPY     COLONOSCOPY WITH PROPOFOL N/A 12/09/2021   Procedure: COLONOSCOPY WITH PROPOFOL;  Surgeon: Lucilla Lame, MD;  Location: [facility];  Service: Endoscopy;   Laterality: N/A;   ESOPHAGEAL DILATION N/A 02/11/2016   Procedure: ESOPHAGEAL DILATION;  Surgeon: Lucilla Lame, MD;  Location: [facility];  Service: Endoscopy;  Laterality: N/A;   ESOPHAGOGASTRODUODENOSCOPY (EGD) WITH PROPOFOL N/A 02/11/2016   Procedure: ESOPHAGOGASTRODUODENOSCOPY (EGD) WITH PROPOFOL;  Surgeon: Lucilla Lame, MD;  Location: [facility];  Service: Endoscopy;  Laterality: N/A;  DIABETIC   Family History  Problem Relation Age of Onset   Stroke Father    Hypertension Father    Hypertension Mother    Stroke Mother    Breast cancer Cousin        2 mat cousins   Breast cancer Cousin        pat cousin   Social History   Tobacco Use   Smoking status: Former    Packs/day: 0.50    Years: 27.00    Total pack years: 13.50    Types: Cigarettes    Quit date: 01/15/1996    Years since quitting: 26.1   Smokeless tobacco: Never  Substance Use Topics   Alcohol use: Yes    Alcohol/week: 0.0 standard drinks of alcohol    Comment: WINE WEEKLY   No Known Allergies  Prior to Admission medications   Medication Sig Start Date End Date Taking? Authorizing Provider  acetaminophen (TYLENOL) 500 MG tablet Take 500 mg by mouth as needed.   Yes [provider]  albuterol (VENTOLIN HFA) 108 (90 Base) MCG/ACT inhaler Inhale  1-2 puffs into the lungs every 6 (six) hours as needed for wheezing or shortness of breath.   Yes [provider]  ascorbic acid (VITAMIN C) 1000 MG tablet Take 1,000 mg by mouth daily.   Yes [provider]  aspirin 81 MG tablet Take 81 mg by mouth daily.   Yes [provider]  Calcium Carbonate-Vit D-Min (CALTRATE 600+D PLUS MINERALS) 600-800 MG-UNIT TABS Take 1 tablet by mouth daily.   Yes [provider]  carvedilol (COREG) 12.5 MG tablet Take 12.5 mg by mouth 2 (two) times daily with a meal. Reported on 10/08/2015   Yes [provider]  cyclobenzaprine (FLEXERIL) 5 MG tablet Take 5 mg by mouth at  bedtime as needed. 09/03/21  Yes [provider]  empagliflozin (JARDIANCE) 10 MG TABS tablet Take 1 tablet (10 mg total) by mouth daily before breakfast. 11/26/21  Yes Clarisa Kindred A, FNP  furosemide (LASIX) 40 MG tablet Take 20 mg by mouth daily as needed. Takes 1 tablet (20 mg) if weight > 231 lb   Yes [provider]  isosorbide mononitrate (IMDUR) 30 MG 24 hr tablet Take 30 mg by mouth daily. 01/13/18  Yes [provider]  MAGNESIUM PO Take 200 mg by mouth daily.   Yes [provider]  Multiple Vitamins-Minerals (CENTRUM SILVER PO) Take 1 tablet by mouth daily.   Yes [provider]  simvastatin (ZOCOR) 40 MG tablet Take 40 mg by mouth daily at 6 PM.    Yes [provider]  spironolactone (ALDACTONE) 25 MG tablet Take 0.5 tablets (12.5 mg total) by mouth daily. Patient taking differently: Take 12.5 mg by mouth daily. Takes 1/2 tablet 04/12/21  Yes Wieting, Richard, MD  Tiotropium Bromide Monohydrate (SPIRIVA RESPIMAT) 2.5 MCG/ACT AERS Inhale 2 puffs into the lungs daily. 01/17/22  Yes Salena Saner, MD  Tiotropium Bromide Monohydrate (SPIRIVA RESPIMAT) 2.5 MCG/ACT AERS Inhale 2 puffs into the lungs daily. 03/05/22  Yes Salena Saner, MD  tiZANidine (ZANAFLEX) 2 MG tablet Take 2 mg by mouth at bedtime as needed. 02/18/22  Yes [provider]  valsartan (DIOVAN) 80 MG tablet Take 1 tablet by mouth daily.   Yes [provider]    Review of Systems  Constitutional:  Positive for fatigue. Negative for appetite change.  HENT:  Negative for congestion, postnasal drip and sore throat.   Eyes: Negative.   Respiratory:  Negative for cough, chest tightness and shortness of breath.   Cardiovascular:  Negative for chest pain, palpitations and leg swelling.  Gastrointestinal:  Negative for abdominal distention and abdominal pain.  Endocrine: Negative.   Genitourinary: Negative.   Musculoskeletal:  Negative for back pain and  neck pain.  Skin: Negative.   Allergic/Immunologic: Negative.   Neurological:  Positive for dizziness and light-headedness. Negative for syncope.  Hematological:  Negative for adenopathy. Does not bruise/bleed easily.  Psychiatric/Behavioral:  Negative for dysphoric mood and sleep disturbance (sleeping on 1 pillow). The patient is not nervous/anxious.    Vitals:   03/07/22 1018  BP: 104/68  Pulse: 68  Resp: 14  SpO2: 97%  Weight: 236 lb 6 oz (107.2 kg)  Height: 5\' 3"  (1.6 m)   Wt Readings from Last 3 Encounters:  03/07/22 236 lb 6 oz (107.2 kg)  02/10/22 234 lb (106.1 kg)  12/09/21 228 lb (103.4 kg)   Lab Results  Component Value Date   CREATININE 0.91 04/11/2021   CREATININE 0.77 04/10/2021   CREATININE 0.81 04/09/2021  Physical Exam Vitals and nursing note reviewed.  Constitutional:      General: She is not in acute distress.    Appearance: Normal appearance.  HENT:     Head: Normocephalic and atraumatic.  Cardiovascular:     Rate and Rhythm: Normal rate and regular rhythm.  Pulmonary:     Effort: Pulmonary effort is normal. No respiratory distress.     Breath sounds: No wheezing or rales.  Abdominal:     General: There is no distension.     Palpations: Abdomen is soft.  Musculoskeletal:        General: No tenderness.     Cervical back: Normal range of motion and neck supple.     Right lower leg: No edema.     Left lower leg: No edema.  Skin:    General: Skin is warm and dry.     Comments: Palms of hands are dry and peeling  Neurological:     General: No focal deficit present.     Mental Status: She is alert and oriented to person, place, and time.  Psychiatric:        Mood and Affect: Mood normal.        Behavior: Behavior normal.        Thought Content: Thought content normal.   Assessment & Plan:  1: Chronic heart failure with reduced ejection fraction- - NYHA class II - euvolemic today - weighing daily; reminded to call for an overnight weight  gain of > 2 pounds or a weekly weight gain of > 5 pounds - weight up 2 pounds from last visit here 3 months ago - taking her furosemide if weight gets >231 pounds and she has taken 20mg  daily every day this week - will check BMP today - says that she hasn't been walking much and needs to get back in the routine - not adding salt and is reading food labels for sodium content as well as serving size; she also rinses out any canned vegetables that she may use - saw cardiology ) 11/26/21  - on GDMT of carvedilol, valsartan, jardiance & spironolactone - getting jardiance through patient assistance as of 12/20/21 - BNP 04/08/21 was 223.9  2: HTN- - BP looks good (104/68) - saw PCP 04/10/21) on 01/14/22 - BMP (from PCP office) 01/28/22 reviewed and showed sodium 138, potassium 4.3, creatinine 1.03 & GFR 59  3: COPD- - using spiriva - saw pulmonology 01/30/22) 08/15/21  4: DM- - A1c 01/28/22 was 6.0% - currently diet controlled.   Medication bottles reviewed.   Return in 6 months, sooner if needed.

## 2022-03-07 NOTE — Telephone Encounter (Signed)
Opened in error

## 2022-03-07 NOTE — Patient Instructions (Addendum)
Continue weighing daily and call for an overnight weight gain of 3 pounds or more or a weekly weight gain of more than 5 pounds.'  If you have voicemail, please make sure your mailbox is cleaned out so that we may leave a message and please make sure to listen to any voicemails.    I will call you when I get your lab results back.    Enjoy your cruise!!

## 2022-03-11 ENCOUNTER — Encounter: Payer: Self-pay | Admitting: Family

## 2022-04-07 DIAGNOSIS — H26493 Other secondary cataract, bilateral: Secondary | ICD-10-CM | POA: Diagnosis not present

## 2022-04-07 DIAGNOSIS — H524 Presbyopia: Secondary | ICD-10-CM | POA: Diagnosis not present

## 2022-04-07 DIAGNOSIS — I10 Essential (primary) hypertension: Secondary | ICD-10-CM | POA: Diagnosis not present

## 2022-04-07 DIAGNOSIS — H35033 Hypertensive retinopathy, bilateral: Secondary | ICD-10-CM | POA: Diagnosis not present

## 2022-04-07 DIAGNOSIS — E119 Type 2 diabetes mellitus without complications: Secondary | ICD-10-CM | POA: Diagnosis not present

## 2022-04-11 ENCOUNTER — Encounter: Payer: Self-pay | Admitting: Pulmonary Disease

## 2022-04-11 ENCOUNTER — Other Ambulatory Visit: Payer: Self-pay

## 2022-04-11 ENCOUNTER — Ambulatory Visit (INDEPENDENT_AMBULATORY_CARE_PROVIDER_SITE_OTHER): Payer: PPO | Admitting: Pulmonary Disease

## 2022-04-11 VITALS — BP 110/68 | HR 68 | Temp 98.2°F | Ht 63.0 in | Wt 236.2 lb

## 2022-04-11 DIAGNOSIS — I5042 Chronic combined systolic (congestive) and diastolic (congestive) heart failure: Secondary | ICD-10-CM

## 2022-04-11 DIAGNOSIS — Z23 Encounter for immunization: Secondary | ICD-10-CM

## 2022-04-11 DIAGNOSIS — J449 Chronic obstructive pulmonary disease, unspecified: Secondary | ICD-10-CM

## 2022-04-11 MED ORDER — SPIRIVA RESPIMAT 2.5 MCG/ACT IN AERS: 2.0000 | INHALATION_SPRAY | Freq: Every day | RESPIRATORY_TRACT | 0 refills | Status: DC

## 2022-04-11 MED ORDER — SPIRIVA RESPIMAT 2.5 MCG/ACT IN AERS: 2.0000 | INHALATION_SPRAY | Freq: Every day | RESPIRATORY_TRACT | 3 refills | Status: DC

## 2022-04-11 NOTE — Progress Notes (Deleted)
   Subjective:    Patient ID: Sarah [name], female    DOB: January 27, 1951, 70 y.o.   MRN: [id number]  HPI    Review of Systems     Objective:   Physical Exam        Assessment & Plan:

## 2022-04-11 NOTE — Patient Instructions (Addendum)
You received your flu vaccine today.  Provided you with a Spiriva sample.  We placed a prescription for your forms to be mailed for Spiriva assistance.  We will see you in follow-up in 6 months time call sooner should any new problems arise.

## 2022-04-11 NOTE — Progress Notes (Signed)
Subjective:    Patient ID: Sarah [name], female    DOB: [birth year], 71 y.o.   MRN: [id number] Patient Care Team: Janie Morning, DO as PCP - General (Family Medicine) Casilda Carls, MD as Consulting Physician (Internal Medicine) Bary Castilla Forest Gleason, MD (General Surgery)  Chief Complaint  Patient presents with   Follow-up    COPD. No SOB, wheezing, or cough.     HPI 71 year old female who follows here for the issue of moderate COPD and dyspnea on exertion.  This is a scheduled visit.  Patient was last seen by me on 15 August 2021.  At that time she was having difficulties procuring Spiriva.  We switched her Spiriva HandiHaler to Spiriva Respimat and she is doing well with this inhaler.  She has also applied to the manufacturer for assistance.  Since her prior visit she has not had any fevers, chills or sweats.  Her dyspnea is at baseline or better.  She does not endorse any orthopnea or paroxysmal nocturnal dyspnea.  No lower extremity edema or calf tenderness.  She has not had any decompensations of COPD since her last visit.  No hospital admissions.  Overall today she looks well and feels well.  She requests flu vaccine.  DATA: 04/29/2016 PFTs: FEV1 1.59 L or 82% predicted, FVC 2.39 L or 98% predicted, FEV1/FVC 67%, diffusion capacity 70%.  Consistent with mild obstruction, concomitant restriction likely due to obesity. 03/17/2019 CT chest: No parenchymal abnormalities, specifically no lung nodule.  No emphysema. 04/08/2021 chest x-ray PA and lateral: Diffuse right lung airspace disease consistent with multifocal pneumonia. 04/09/2021 echocardiogram: LVEF 45 to 50% with mild decreased function in the LV.  No wall motion abnormalities.  Diastolic dysfunction grade 1.  Mild mitral regurgitation. 05/08/2021 chest x-ray PA lateral: No acute cardiopulmonary disease, pneumonia resolved. 06/25/2021 PFTs: FEV1 1.78 L or 100% predicted, FVC 2.37 L or 103% of predicted.  FEV1/FVC 75% lung  volumes normal, no bronchodilator response diffusion capacity 82%, low end of normal.  Compared to PFTs performed November 2017 there has been improvement.  Review of Systems A 10 point review of systems was performed and it is as noted above otherwise negative.  Patient Active Problem List   Diagnosis Date Noted   Colon cancer screening    Impaired fasting glucose    Acute pain of right shoulder    Acute on chronic combined systolic and diastolic CHF (congestive heart failure) ([place]) 04/08/2021   Diabetes mellitus without complication (HCC)    COPD (chronic obstructive pulmonary disease) (HCC)    HLD (hyperlipidemia)    CAD (coronary artery disease)    Chest pain    Osteoarthritis of knee 07/09/2017   Patellofemoral stress syndrome 07/09/2017   Venous insufficiency of both lower extremities 02/11/2017   Chronic diastolic CHF (congestive heart failure), NYHA class 2 ([place]) 05/12/2016   Controlled type 2 diabetes mellitus without complication, without long-term current use of insulin ([place]) 05/12/2016   Essential hypertension 04/04/2016   Mixed hyperlipidemia 04/04/2016   Other diseases of stomach and duodenum    Problems with swallowing and mastication    Left buttock abscess 12/15/2014   DOE (dyspnea on exertion) 06/05/2014   COPD type A ([place]) 06/05/2014   Morbid obesity with BMI of 40.0-44.9, adult ([place]) 06/05/2014   Social History   Tobacco Use   Smoking status: Former    Packs/day: 0.50    Years: 27.00    Total pack years: 13.50    Types: Cigarettes    Quit  date: 01/15/1996    Years since quitting: 26.2   Smokeless tobacco: Never  Substance Use Topics   Alcohol use: Yes    Alcohol/week: 0.0 standard drinks of alcohol    Comment: WINE WEEKLY   No Known Allergies  Current Meds  Medication Sig   acetaminophen (TYLENOL) 500 MG tablet Take 500 mg by mouth as needed.   albuterol (VENTOLIN HFA) 108 (90 Base) MCG/ACT inhaler Inhale 1-2 puffs into the lungs every 6 (six)  hours as needed for wheezing or shortness of breath.   ascorbic acid (VITAMIN C) 1000 MG tablet Take 1,000 mg by mouth daily.   aspirin 81 MG tablet Take 81 mg by mouth daily.   Calcium Carbonate-Vit D-Min (CALTRATE 600+D PLUS MINERALS) 600-800 MG-UNIT TABS Take 1 tablet by mouth daily.   carvedilol (COREG) 12.5 MG tablet Take 12.5 mg by mouth 2 (two) times daily with a meal. Reported on 10/08/2015   cyclobenzaprine (FLEXERIL) 5 MG tablet Take 5 mg by mouth at bedtime as needed.   empagliflozin (JARDIANCE) 10 MG TABS tablet Take 1 tablet (10 mg total) by mouth daily before breakfast.   furosemide (LASIX) 40 MG tablet Take 20 mg by mouth daily as needed. Takes 1 tablet (20 mg) if weight > 231 lb   isosorbide mononitrate (IMDUR) 30 MG 24 hr tablet Take 30 mg by mouth daily.   MAGNESIUM PO Take 200 mg by mouth daily.   Multiple Vitamins-Minerals (CENTRUM SILVER PO) Take 1 tablet by mouth daily.   simvastatin (ZOCOR) 40 MG tablet Take 40 mg by mouth daily at 6 PM.    spironolactone (ALDACTONE) 25 MG tablet Take 0.5 tablets (12.5 mg total) by mouth daily. (Patient taking differently: Take 12.5 mg by mouth daily. Takes 1/2 tablet)   Tiotropium Bromide Monohydrate (SPIRIVA RESPIMAT) 2.5 MCG/ACT AERS Inhale 2 puffs into the lungs daily.   Tiotropium Bromide Monohydrate (SPIRIVA RESPIMAT) 2.5 MCG/ACT AERS Inhale 2 puffs into the lungs daily.   tiZANidine (ZANAFLEX) 2 MG tablet Take 2 mg by mouth at bedtime as needed.   valsartan (DIOVAN) 80 MG tablet Take 1 tablet by mouth daily.   Immunization History  Administered Date(s) Administered   Fluad Quad(high Dose 65+) 05/08/2021   Influenza, High Dose Seasonal PF 04/09/2018   Influenza, Quadrivalent, Recombinant, Inj, Pf 04/18/2019, 04/30/2020   Influenza-Unspecified 03/02/2014, 04/18/2019   Moderna Sars-Covid-2 Vaccination 07/07/2019, 07/29/2019, 08/29/2019   PNEUMOCOCCAL CONJUGATE-20 01/30/2021   PPD Test 08/19/2019, 09/21/2020, 09/09/2021    Pneumococcal Polysaccharide-23 05/17/2015       Objective:   Physical Exam BP 110/68 (BP Location: Left Arm, Cuff Size: Large)   Pulse 68   Temp 98.2 F (36.8 C)   Ht 5\' 3"  (1.6 m)   Wt 236 lb 3.2 oz (107.1 kg)   SpO2 97%   BMI 41.84 kg/m  GENERAL: Obese woman, no acute distress, fully ambulatory.  No conversational dyspnea. HEAD: Normocephalic, atraumatic.  EYES: Pupils equal, round, reactive to light.  No scleral icterus.  MOUTH: Dentition is full.  Oral mucosa moist.  No thrush. NECK: Supple. No thyromegaly. Trachea midline. No JVD.  No adenopathy. PULMONARY: Good air entry bilaterally.  No adventitious sounds. CARDIOVASCULAR: S1 and S2. Regular rate and rhythm.  No rubs, murmurs or gallops heard. ABDOMEN: Obese otherwise benign. MUSCULOSKELETAL: No joint deformity, no clubbing, no edema.  NEUROLOGIC: No overt focal deficit, no gait disturbance, speech is fluent. SKIN: Intact,warm,dry. PSYCH: Mood and behavior normal.     Assessment & Plan:  ICD-10-CM   1. Stage 2 moderate COPD by GOLD classification (HCC)  J44.9    Appears well compensated Continue Spiriva and as needed albuterol    2. Chronic combined systolic and diastolic CHF, NYHA class 2 (HCC)  I50.42    This issue adds complexity to her management Follows with the heart failure clinic Keeps cardiology appointments regularly    3. Need for immunization against influenza  Z23 Flu Vaccine QUAD High Dose(Fluad)   Received influenza vaccine today     Orders Placed This Encounter  Procedures   Flu Vaccine QUAD High Dose(Fluad)   Meds ordered this encounter  Medications   Tiotropium Bromide Monohydrate (SPIRIVA RESPIMAT) 2.5 MCG/ACT AERS    Sig: Inhale 2 puffs into the lungs daily.    Dispense:  4 g    Refill:  0    Order Specific Question:   Lot Number?    Answer:   [id number]    Order Specific Question:   Expiration Date?    Answer:   09/15/2023    Order Specific Question:   Quantity    Answer:   1    Patient appears to be well compensated from the COPD standpoint.  Overall, her lung function has IMPROVED.  Continue Spiriva for now.  We will see her in follow-up in 6 months time she is to contact us prior to that time should any new difficulties arise.  Gailen Shelter, MD Advanced Bronchoscopy PCCM [facility]    *This note was dictated using voice recognition software/Dragon.  Despite best efforts to proofread, errors can occur which can change the meaning. Any transcriptional errors that result from this process are unintentional and may not be fully corrected at the time of dictation.

## 2022-04-16 ENCOUNTER — Other Ambulatory Visit: Payer: Self-pay | Admitting: Family

## 2022-04-16 MED ORDER — EMPAGLIFLOZIN 10 MG PO TABS
10.0000 mg | ORAL_TABLET | Freq: Every day | ORAL | 3 refills | Status: DC
Start: 2022-04-16 — End: 2022-06-26

## 2022-04-29 DIAGNOSIS — I10 Essential (primary) hypertension: Secondary | ICD-10-CM | POA: Diagnosis not present

## 2022-04-29 DIAGNOSIS — I5030 Unspecified diastolic (congestive) heart failure: Secondary | ICD-10-CM | POA: Diagnosis not present

## 2022-04-29 DIAGNOSIS — R7309 Other abnormal glucose: Secondary | ICD-10-CM | POA: Diagnosis not present

## 2022-05-14 DIAGNOSIS — M47816 Spondylosis without myelopathy or radiculopathy, lumbar region: Secondary | ICD-10-CM | POA: Diagnosis not present

## 2022-05-14 DIAGNOSIS — I5030 Unspecified diastolic (congestive) heart failure: Secondary | ICD-10-CM | POA: Diagnosis not present

## 2022-05-14 DIAGNOSIS — L309 Dermatitis, unspecified: Secondary | ICD-10-CM | POA: Diagnosis not present

## 2022-05-14 DIAGNOSIS — R7309 Other abnormal glucose: Secondary | ICD-10-CM | POA: Diagnosis not present

## 2022-05-14 DIAGNOSIS — I2510 Atherosclerotic heart disease of native coronary artery without angina pectoris: Secondary | ICD-10-CM | POA: Diagnosis not present

## 2022-05-14 DIAGNOSIS — J449 Chronic obstructive pulmonary disease, unspecified: Secondary | ICD-10-CM | POA: Diagnosis not present

## 2022-05-14 DIAGNOSIS — E7800 Pure hypercholesterolemia, unspecified: Secondary | ICD-10-CM | POA: Diagnosis not present

## 2022-05-14 DIAGNOSIS — I10 Essential (primary) hypertension: Secondary | ICD-10-CM | POA: Diagnosis not present

## 2022-05-14 DIAGNOSIS — E559 Vitamin D deficiency, unspecified: Secondary | ICD-10-CM | POA: Diagnosis not present

## 2022-06-26 ENCOUNTER — Other Ambulatory Visit: Payer: Self-pay | Admitting: Family

## 2022-07-10 ENCOUNTER — Telehealth: Payer: Self-pay | Admitting: Pulmonary Disease

## 2022-07-10 MED ORDER — SPIRIVA RESPIMAT 2.5 MCG/ACT IN AERS
2.0000 | INHALATION_SPRAY | Freq: Every day | RESPIRATORY_TRACT | 0 refills | Status: DC
Start: 2022-07-10 — End: 2022-08-20

## 2022-07-10 MED ORDER — SPIRIVA RESPIMAT 1.25 MCG/ACT IN AERS: 2.0000 | INHALATION_SPRAY | Freq: Every day | RESPIRATORY_TRACT | 0 refills | Status: DC

## 2022-07-10 NOTE — Telephone Encounter (Signed)
Two samples of spiriva has been placed up front for pick up.  Patient is aware and voiced her understanding.  Nothing further needed.

## 2022-07-23 DIAGNOSIS — R319 Hematuria, unspecified: Secondary | ICD-10-CM | POA: Diagnosis not present

## 2022-07-23 DIAGNOSIS — N200 Calculus of kidney: Secondary | ICD-10-CM | POA: Diagnosis not present

## 2022-07-23 DIAGNOSIS — R102 Pelvic and perineal pain: Secondary | ICD-10-CM | POA: Diagnosis not present

## 2022-07-23 DIAGNOSIS — M545 Low back pain, unspecified: Secondary | ICD-10-CM | POA: Diagnosis not present

## 2022-08-06 DIAGNOSIS — E559 Vitamin D deficiency, unspecified: Secondary | ICD-10-CM | POA: Diagnosis not present

## 2022-08-06 DIAGNOSIS — I2510 Atherosclerotic heart disease of native coronary artery without angina pectoris: Secondary | ICD-10-CM | POA: Diagnosis not present

## 2022-08-06 DIAGNOSIS — R7309 Other abnormal glucose: Secondary | ICD-10-CM | POA: Diagnosis not present

## 2022-08-06 DIAGNOSIS — I10 Essential (primary) hypertension: Secondary | ICD-10-CM | POA: Diagnosis not present

## 2022-08-06 DIAGNOSIS — E7800 Pure hypercholesterolemia, unspecified: Secondary | ICD-10-CM | POA: Diagnosis not present

## 2022-08-07 DIAGNOSIS — Z87891 Personal history of nicotine dependence: Secondary | ICD-10-CM | POA: Diagnosis not present

## 2022-08-07 DIAGNOSIS — I2510 Atherosclerotic heart disease of native coronary artery without angina pectoris: Secondary | ICD-10-CM | POA: Diagnosis not present

## 2022-08-07 DIAGNOSIS — I5022 Chronic systolic (congestive) heart failure: Secondary | ICD-10-CM | POA: Diagnosis not present

## 2022-08-07 DIAGNOSIS — I5189 Other ill-defined heart diseases: Secondary | ICD-10-CM | POA: Diagnosis not present

## 2022-08-07 DIAGNOSIS — Z7982 Long term (current) use of aspirin: Secondary | ICD-10-CM | POA: Diagnosis not present

## 2022-08-07 DIAGNOSIS — I10 Essential (primary) hypertension: Secondary | ICD-10-CM | POA: Diagnosis not present

## 2022-08-13 ENCOUNTER — Encounter: Payer: Self-pay | Admitting: Pulmonary Disease

## 2022-08-13 DIAGNOSIS — N200 Calculus of kidney: Secondary | ICD-10-CM | POA: Diagnosis not present

## 2022-08-13 DIAGNOSIS — M47816 Spondylosis without myelopathy or radiculopathy, lumbar region: Secondary | ICD-10-CM | POA: Diagnosis not present

## 2022-08-13 DIAGNOSIS — M1710 Unilateral primary osteoarthritis, unspecified knee: Secondary | ICD-10-CM | POA: Diagnosis not present

## 2022-08-13 DIAGNOSIS — I509 Heart failure, unspecified: Secondary | ICD-10-CM | POA: Diagnosis not present

## 2022-08-13 DIAGNOSIS — I10 Essential (primary) hypertension: Secondary | ICD-10-CM | POA: Diagnosis not present

## 2022-08-13 DIAGNOSIS — I2510 Atherosclerotic heart disease of native coronary artery without angina pectoris: Secondary | ICD-10-CM | POA: Diagnosis not present

## 2022-08-13 DIAGNOSIS — E7800 Pure hypercholesterolemia, unspecified: Secondary | ICD-10-CM | POA: Diagnosis not present

## 2022-08-13 DIAGNOSIS — R7309 Other abnormal glucose: Secondary | ICD-10-CM | POA: Diagnosis not present

## 2022-08-13 DIAGNOSIS — J449 Chronic obstructive pulmonary disease, unspecified: Secondary | ICD-10-CM | POA: Diagnosis not present

## 2022-08-19 ENCOUNTER — Telehealth: Payer: Self-pay | Admitting: Pulmonary Disease

## 2022-08-19 NOTE — Telephone Encounter (Signed)
Patient would like samples of Spiriva Respimat. Patient phone number is [phone].

## 2022-08-19 NOTE — Telephone Encounter (Signed)
Spoke to patient.  She is requesting samples of spiriva. She was in [facility] care program last year but she received a letter that it is not covered this year.  She will contact insurance for a list of cheaper alternatives. She will call back with update.  Will determine if sample needs to be placed up front when patient calls back with update.

## 2022-08-20 ENCOUNTER — Other Ambulatory Visit: Payer: Self-pay | Admitting: Pulmonary Disease

## 2022-08-20 MED ORDER — SPIRIVA RESPIMAT 2.5 MCG/ACT IN AERS: 2.0000 | INHALATION_SPRAY | Freq: Every day | RESPIRATORY_TRACT | 0 refills | Status: DC

## 2022-08-20 MED ORDER — ATROVENT HFA 17 MCG/ACT IN AERS: 2.0000 | INHALATION_SPRAY | Freq: Four times a day (QID) | RESPIRATORY_TRACT | 12 refills | Status: DC

## 2022-08-20 NOTE — Telephone Encounter (Signed)
Pt states her she is only cover for the Iprapropium bromide (Inhaler tier 2 med)

## 2022-08-20 NOTE — Telephone Encounter (Signed)
Spiriva is not covered by insurance. Iprapropium bromide is preferred.  Dr. Patsey Berthold, please advise. Thanks

## 2022-08-20 NOTE — Telephone Encounter (Signed)
Lm x1 for patient.  

## 2022-08-20 NOTE — Telephone Encounter (Signed)
I notified the patient. I have placed her samples at the front desk and sent in the script for Atrovent.  Nothing further needed.

## 2022-08-20 NOTE — Telephone Encounter (Signed)
Okay to provide samples of Spiriva.  We can send in a prescription for ipratropium but this would have to be dosed 4 times a day.  It would be Atrovent HFA 2 puffs 4 times a day.

## 2022-08-26 ENCOUNTER — Ambulatory Visit: Payer: PRIVATE HEALTH INSURANCE | Admitting: Family

## 2022-08-26 DIAGNOSIS — R102 Pelvic and perineal pain: Secondary | ICD-10-CM | POA: Diagnosis not present

## 2022-08-26 DIAGNOSIS — R3121 Asymptomatic microscopic hematuria: Secondary | ICD-10-CM | POA: Diagnosis not present

## 2022-08-26 DIAGNOSIS — N3941 Urge incontinence: Secondary | ICD-10-CM | POA: Diagnosis not present

## 2022-09-05 NOTE — Progress Notes (Unsigned)
Patient ID: Sarah [name], female    DOB: 05-04-1951, 72 y.o.   MRN: [id number]   Sarah [name] is a 72 y/o female with a history of DM, HTN, COPD, previous tobacco use and chronic heart failure.   Echo 04/09/21: EF of 45-50% along with mild MR.   Has not been admitted or been in the ED in the last 6 months.   She presents today for a follow-up visit with a chief complaint of minimal  Past Medical History:  Diagnosis Date   Carotid artery disease ([place])    Congestive heart failure (HCC)    COPD (chronic obstructive pulmonary disease) ([place])    Diabetes mellitus without complication ([place])    pt denies, however is on metformin daily   HBP (high blood pressure)    Heart murmur    Past Surgical History:  Procedure Laterality Date   ABDOMINAL HYSTERECTOMY  1991   CARDIAC CATHETERIZATION  1999   COLONOSCOPY     COLONOSCOPY WITH PROPOFOL N/A 12/09/2021   Procedure: COLONOSCOPY WITH PROPOFOL;  Surgeon: Lucilla Lame, MD;  Location: [facility];  Service: Endoscopy;  Laterality: N/A;   ESOPHAGEAL DILATION N/A 02/11/2016   Procedure: ESOPHAGEAL DILATION;  Surgeon: Lucilla Lame, MD;  Location: [facility];  Service: Endoscopy;  Laterality: N/A;   ESOPHAGOGASTRODUODENOSCOPY (EGD) WITH PROPOFOL N/A 02/11/2016   Procedure: ESOPHAGOGASTRODUODENOSCOPY (EGD) WITH PROPOFOL;  Surgeon: Lucilla Lame, MD;  Location: [place];  Service: Endoscopy;  Laterality: N/A;  DIABETIC   Family History  Problem Relation Age of Onset   Stroke Father    Hypertension Father    Hypertension Mother    Stroke Mother    Breast cancer Cousin        2 mat cousins   Breast cancer Cousin        pat cousin   Social History   Tobacco Use   Smoking status: Former    Packs/day: 0.50    Years: 27.00    Additional pack years: 0.00    Total pack years: 13.50    Types: Cigarettes    Quit date: 01/15/1996    Years since quitting: 26.6   Smokeless tobacco: Never  Substance Use Topics   Alcohol use:  Yes    Alcohol/week: 0.0 standard drinks of alcohol    Comment: WINE WEEKLY   No Known Allergies    Review of Systems  Constitutional:  Positive for fatigue. Negative for appetite change.  HENT:  Negative for congestion, postnasal drip and sore throat.   Eyes: Negative.   Respiratory:  Negative for cough, chest tightness and shortness of breath.   Cardiovascular:  Negative for chest pain, palpitations and leg swelling.  Gastrointestinal:  Negative for abdominal distention and abdominal pain.  Endocrine: Negative.   Genitourinary: Negative.   Musculoskeletal:  Negative for back pain and neck pain.  Skin: Negative.   Allergic/Immunologic: Negative.   Neurological:  Positive for dizziness and light-headedness. Negative for syncope.  Hematological:  Negative for adenopathy. Does not bruise/bleed easily.  Psychiatric/Behavioral:  Negative for dysphoric mood and sleep disturbance (sleeping on 1 pillow). The patient is not nervous/anxious.      Physical Exam Vitals and nursing note reviewed.  Constitutional:      General: She is not in acute distress.    Appearance: Normal appearance.  HENT:     Head: Normocephalic and atraumatic.  Cardiovascular:     Rate and Rhythm: Normal rate and regular rhythm.  Pulmonary:     Effort:  Pulmonary effort is normal. No respiratory distress.     Breath sounds: No wheezing or rales.  Abdominal:     General: There is no distension.     Palpations: Abdomen is soft.  Musculoskeletal:        General: No tenderness.     Cervical back: Normal range of motion and neck supple.     Right lower leg: No edema.     Left lower leg: No edema.  Skin:    General: Skin is warm and dry.     Comments: Palms of hands are dry and peeling  Neurological:     General: No focal deficit present.     Mental Status: She is alert and oriented to person, place, and time.  Psychiatric:        Mood and Affect: Mood normal.        Behavior: Behavior normal.         Thought Content: Thought content normal.   Assessment & Plan:  1: Chronic heart failure with reduced ejection fraction- - NYHA class II - euvolemic today - weighing daily; reminded to call for an overnight weight gain of > 2 pounds or a weekly weight gain of > 5 pounds - weight 236.6 pounds from last visit here 6 months ago - echo 04/09/21: EF of 45-50% along with mild MR - updated echo has been scheduled for - taking her furosemide if weight gets >231 pounds   - says that she hasn't been walking much and needs to get back in the routine - not adding salt and is reading food labels for sodium content as well as serving size; she also rinses out any canned vegetables that she may use - saw cardiology ([place]) 08/07/22 - carvedilol - valsartan - jardiance - spironolactone - getting jardiance through patient assistance as of 12/20/21 - BNP 04/08/21 was 223.9  2: HTN- - BP  - saw PCP Theda Sers) on 01/14/22 - BMP 03/07/22 reviewed and showed sodium 139, potassium 4.0, creatinine 1.00 & GFR >60  3: COPD- - using spiriva - saw pulmonology Patsey Berthold) 04/11/22  4: DM- - A1c 01/28/22 was 6.0% - currently diet controlled.

## 2022-09-08 ENCOUNTER — Ambulatory Visit: Payer: PPO | Attending: Family | Admitting: Family

## 2022-09-08 ENCOUNTER — Encounter: Payer: Self-pay | Admitting: Family

## 2022-09-08 ENCOUNTER — Telehealth: Payer: Self-pay

## 2022-09-08 VITALS — BP 98/69 | HR 69 | Resp 14 | Wt 247.2 lb

## 2022-09-08 DIAGNOSIS — J449 Chronic obstructive pulmonary disease, unspecified: Secondary | ICD-10-CM | POA: Diagnosis not present

## 2022-09-08 DIAGNOSIS — I10 Essential (primary) hypertension: Secondary | ICD-10-CM

## 2022-09-08 DIAGNOSIS — I110 Hypertensive heart disease with heart failure: Secondary | ICD-10-CM | POA: Insufficient documentation

## 2022-09-08 DIAGNOSIS — I5022 Chronic systolic (congestive) heart failure: Secondary | ICD-10-CM | POA: Diagnosis present

## 2022-09-08 DIAGNOSIS — R319 Hematuria, unspecified: Secondary | ICD-10-CM | POA: Insufficient documentation

## 2022-09-08 DIAGNOSIS — Z79899 Other long term (current) drug therapy: Secondary | ICD-10-CM | POA: Diagnosis not present

## 2022-09-08 DIAGNOSIS — E119 Type 2 diabetes mellitus without complications: Secondary | ICD-10-CM

## 2022-09-08 NOTE — Patient Instructions (Signed)
We will call you once the echo gets scheduled.

## 2022-09-08 NOTE — Telephone Encounter (Signed)
Spoke with pt to inform them the echocardiogram has been scheduled for 10/09/22 at 11 AM.  Pt confirmed this date and time was agreeable to them.  Reviewed with patient to arrive at the [place] entrance at [facility] around 10:45 AM that morning to check in and register for echo. Pt verbalized understanding.  Scheduled follow up appt with patient to review echo with Darylene Price, NP, for 10/13/22 at 10 AM.

## 2022-09-11 ENCOUNTER — Telehealth: Payer: Self-pay | Admitting: Family

## 2022-09-11 NOTE — Telephone Encounter (Signed)
Lab results from PCP office dated 08/06/22:   Sodium 141 Potassium 4.3 Creatinine 0.98 eGFR 62 BUN 17 A1c 5.9%

## 2022-09-12 DIAGNOSIS — M170 Bilateral primary osteoarthritis of knee: Secondary | ICD-10-CM | POA: Diagnosis not present

## 2022-09-16 DIAGNOSIS — R319 Hematuria, unspecified: Secondary | ICD-10-CM | POA: Diagnosis not present

## 2022-09-16 DIAGNOSIS — N200 Calculus of kidney: Secondary | ICD-10-CM | POA: Diagnosis not present

## 2022-09-16 DIAGNOSIS — R3121 Asymptomatic microscopic hematuria: Secondary | ICD-10-CM | POA: Diagnosis not present

## 2022-09-17 ENCOUNTER — Encounter: Payer: Self-pay | Admitting: Pulmonary Disease

## 2022-09-17 ENCOUNTER — Ambulatory Visit: Payer: PPO | Admitting: Pulmonary Disease

## 2022-09-17 VITALS — BP 124/78 | HR 63 | Temp 97.3°F | Ht 63.0 in | Wt 246.4 lb

## 2022-09-17 DIAGNOSIS — J449 Chronic obstructive pulmonary disease, unspecified: Secondary | ICD-10-CM

## 2022-09-17 DIAGNOSIS — Z6841 Body Mass Index (BMI) 40.0 and over, adult: Secondary | ICD-10-CM | POA: Diagnosis not present

## 2022-09-17 DIAGNOSIS — Z87891 Personal history of nicotine dependence: Secondary | ICD-10-CM | POA: Diagnosis not present

## 2022-09-17 DIAGNOSIS — I5042 Chronic combined systolic (congestive) and diastolic (congestive) heart failure: Secondary | ICD-10-CM | POA: Diagnosis not present

## 2022-09-17 MED ORDER — SPIRIVA RESPIMAT 2.5 MCG/ACT IN AERS: 2.0000 | INHALATION_SPRAY | Freq: Every day | RESPIRATORY_TRACT | 0 refills | Status: DC

## 2022-09-17 NOTE — Progress Notes (Signed)
Subjective:    Patient ID: Sarah [name], female    DOB: [birth year], 72 y.o.   MRN: [id number] Patient Care Team: Janie Morning, DO as PCP - General (Family Medicine) Casilda Carls, MD as Consulting Physician (Internal Medicine) Bary Castilla Forest Gleason, MD (General Surgery) Tyler Pita, MD as Consulting Physician (Pulmonary Disease)  Chief Complaint  Patient presents with   Follow-up    Doing Good! No SOB, wheezing or cough.    HPI 72 year old female, former smoker (13.5 PY), who follows here for the issue of moderate COPD and dyspnea on exertion.  This is a scheduled visit.  Patient was last seen by me on 11 April 2022.  Continues to be on Spiriva 2.5 mcg, 2 inhalations daily.  She notes good control of symptoms of dyspnea with this inhaler.  She has not had any COPD exacerbations nor hospital admissions since her last visit.  She also has combined systolic and diastolic heart failure and follows up with cardiology in this regard.  No decompensation of this issue.  She is to have 2D echo on 25 April ordered by cardiology.  Since her prior visit she has not had any fevers, chills or sweats.  Her dyspnea is at baseline.  She does note that she has had some weight gain and she is working on taking weight off.  She notes that her breathing is much better when her weight is better controlled.  She does not endorse any orthopnea or paroxysmal nocturnal dyspnea.  No lower extremity edema or calf tenderness.  She has not had any nocturnal symptoms of snoring or fractured sleep.  She does note that she is rested when she wakes up in the morning.  She rarely has to use albuterol if at all.  Recently she had some issues with microscopic hematuria and is being evaluated by [facility] in [place].  She apparently had a CT abdomen and pelvis however I do not have those results the films are not available in our PACS system.  She has an upcoming urology appointment to follow-up on these  issues.   Overall today she looks well and feels well.  She requests flu vaccine.  DATA: 04/29/2016 PFTs: FEV1 1.59 L or 82% predicted, FVC 2.39 L or 98% predicted, FEV1/FVC 67%, diffusion capacity 70%.  Consistent with mild obstruction, concomitant restriction likely due to obesity. 03/17/2019 CT chest: No parenchymal abnormalities, specifically no lung nodule.  No emphysema. 04/08/2021 chest x-ray PA and lateral: Diffuse right lung airspace disease consistent with multifocal pneumonia. 04/09/2021 echocardiogram: LVEF 45 to 50% with mild decreased function in the LV.  No wall motion abnormalities.  Diastolic dysfunction grade 1.  Mild mitral regurgitation. 05/08/2021 chest x-ray PA lateral: No acute cardiopulmonary disease, pneumonia resolved. 06/25/2021 PFTs: FEV1 1.78 L or 100% predicted, FVC 2.37 L or 103% of predicted.  FEV1/FVC 75% lung volumes normal, no bronchodilator response diffusion capacity 82%, low end of normal.  Compared to PFTs performed November 2017 there has been improvement.  Review of Systems A 10 point review of systems was performed and it is as noted above otherwise negative.  Patient Active Problem List   Diagnosis Date Noted   Colon cancer screening    Impaired fasting glucose    Acute pain of right shoulder    Acute on chronic combined systolic and diastolic CHF (congestive heart failure) 04/08/2021   Diabetes mellitus without complication    COPD (chronic obstructive pulmonary disease)    HLD (hyperlipidemia)    CAD (  coronary artery disease)    Chest pain    Osteoarthritis of knee 07/09/2017   Patellofemoral stress syndrome 07/09/2017   Venous insufficiency of both lower extremities 02/11/2017   Chronic diastolic CHF (congestive heart failure), NYHA class 2 05/12/2016   Controlled type 2 diabetes mellitus without complication, without long-term current use of insulin 05/12/2016   Essential hypertension 04/04/2016   Mixed hyperlipidemia 04/04/2016   Other  diseases of stomach and duodenum    Problems with swallowing and mastication    Left buttock abscess 12/15/2014   DOE (dyspnea on exertion) 06/05/2014   COPD type A 06/05/2014   Morbid obesity with BMI of 40.0-44.9, adult ([place]) 06/05/2014   Social History   Tobacco Use   Smoking status: Former    Packs/day: 0.50    Years: 27.00    Additional pack years: 0.00    Total pack years: 13.50    Types: Cigarettes    Quit date: 01/15/1996    Years since quitting: 26.6   Smokeless tobacco: Never  Substance Use Topics   Alcohol use: Yes    Alcohol/week: 0.0 standard drinks of alcohol    Comment: WINE WEEKLY   No Known Allergies  Current Meds  Medication Sig   acetaminophen (TYLENOL) 500 MG tablet Take 500 mg by mouth as needed.   albuterol (VENTOLIN HFA) 108 (90 Base) MCG/ACT inhaler Inhale 1-2 puffs into the lungs every 6 (six) hours as needed for wheezing or shortness of breath.   ascorbic acid (VITAMIN C) 1000 MG tablet Take 1,000 mg by mouth daily.   aspirin 81 MG tablet Take 81 mg by mouth daily.   Calcium Carbonate-Vit D-Min (CALTRATE 600+D PLUS MINERALS) 600-800 MG-UNIT TABS Take 1 tablet by mouth daily.   carvedilol (COREG) 12.5 MG tablet Take 12.5 mg by mouth 2 (two) times daily with a meal. Reported on 10/08/2015   cyclobenzaprine (FLEXERIL) 5 MG tablet Take 5 mg by mouth at bedtime as needed.   furosemide (LASIX) 40 MG tablet Take 20 mg by mouth daily as needed. Takes 1 tablet (20 mg) if weight > 231 lb   isosorbide mononitrate (IMDUR) 30 MG 24 hr tablet Take 30 mg by mouth daily.   JARDIANCE 10 MG TABS tablet TAKE 1 TABLET BY MOUTH DAILY BEFORE BREAKFAST.   Multiple Vitamins-Minerals (CENTRUM SILVER PO) Take 1 tablet by mouth daily.   simvastatin (ZOCOR) 40 MG tablet Take 40 mg by mouth daily at 6 PM.    spironolactone (ALDACTONE) 25 MG tablet Take 0.5 tablets (12.5 mg total) by mouth daily. (Patient taking differently: Take 12.5 mg by mouth daily. Takes 1/2 tablet)    Tiotropium Bromide Monohydrate (SPIRIVA RESPIMAT) 2.5 MCG/ACT AERS Inhale 2 puffs into the lungs daily.   tiZANidine (ZANAFLEX) 2 MG tablet Take 2 mg by mouth at bedtime as needed.   valsartan (DIOVAN) 80 MG tablet Take 1 tablet by mouth daily.   [DISCONTINUED] Tiotropium Bromide Monohydrate (SPIRIVA RESPIMAT) 2.5 MCG/ACT AERS Inhale 2 puffs into the lungs daily.   Immunization History  Administered Date(s) Administered   Fluad Quad(high Dose 65+) 05/08/2021, 04/11/2022   Influenza, High Dose Seasonal PF 04/09/2018   Influenza, Quadrivalent, Recombinant, Inj, Pf 04/18/2019, 04/30/2020   Influenza-Unspecified 03/02/2014, 04/18/2019   Moderna Sars-Covid-2 Vaccination 07/07/2019, 07/29/2019, 08/29/2019   PNEUMOCOCCAL CONJUGATE-20 01/30/2021   PPD Test 08/19/2019, 09/21/2020, 09/09/2021   Pneumococcal Polysaccharide-23 05/17/2015       Objective:   Physical Exam BP 124/78 (BP Location: Left Arm, Cuff Size: Large)  Pulse 63   Temp (!) 97.3 F (36.3 C)   Ht 5\' 3"  (1.6 m)   Wt 246 lb 6.4 oz (111.8 kg)   SpO2 97%   BMI 43.65 kg/m   SpO2: 97 % O2 Device: None (Room air)  GENERAL: Obese woman, no acute distress, fully ambulatory.  No conversational dyspnea. HEAD: Normocephalic, atraumatic.  EYES: Pupils equal, round, reactive to light.  No scleral icterus.  MOUTH: Dentition is full.  Oral mucosa moist.  No thrush. NECK: Supple. No thyromegaly. Trachea midline. No JVD.  No adenopathy. PULMONARY: Good air entry bilaterally.  No adventitious sounds. CARDIOVASCULAR: S1 and S2. Regular rate and rhythm.  No rubs, murmurs or gallops heard. ABDOMEN: Obese otherwise benign. MUSCULOSKELETAL: No joint deformity, no clubbing, no edema.  NEUROLOGIC: No overt focal deficit, no gait disturbance, speech is fluent. SKIN: Intact,warm,dry. PSYCH: Mood and behavior normal.      Assessment & Plan:     ICD-10-CM   1. Stage 2 moderate COPD by GOLD classification  J44.9    Continue Spiriva 2.5  mcg, 2 inhalations once a day Continue as needed albuterol    2. Chronic combined systolic and diastolic CHF, NYHA class 2  I50.42    Patient follows with cardiology Has upcoming 2D echo    3. Former smoker  Z87.891    No evidence of relapse    4. Morbid obesity with BMI of 40.0-44.9, adult ([place])  E66.01    Z68.41    Patient currently on weight loss program This issue adds complexity to her management     Meds ordered this encounter  Medications   Tiotropium Bromide Monohydrate (SPIRIVA RESPIMAT) 2.5 MCG/ACT AERS    Sig: Inhale 2 puffs into the lungs daily.    Dispense:  8 g    Refill:  0    Order Specific Question:   Lot Number?    Answer:   [id number]    Order Specific Question:   Expiration Date?    Answer:   06/16/2024    Order Specific Question:   Quantity    Answer:   2   Patient appears to be well compensated.  She is to continue her current regimen.  Will see her in follow-up in 6 months time she is to call sooner should any new difficulties arise.  Renold Don, MD Advanced Bronchoscopy P[facility]    *This note was dictated using voice recognition software/Dragon.  Despite best efforts to proofread, errors can occur which can change the meaning. Any transcriptional errors that result from this process are unintentional and may not be fully corrected at the time of dictation.

## 2022-09-17 NOTE — Patient Instructions (Signed)
Your lungs sounded clear today.  Continue your Spiriva 2 puffs daily.  Have provided you with samples of the Spiriva.  We will see you in follow-up in 6 months time call sooner should you have any questions or concerns.

## 2022-09-18 DIAGNOSIS — R3915 Urgency of urination: Secondary | ICD-10-CM | POA: Diagnosis not present

## 2022-09-18 DIAGNOSIS — N200 Calculus of kidney: Secondary | ICD-10-CM | POA: Diagnosis not present

## 2022-09-18 DIAGNOSIS — R3121 Asymptomatic microscopic hematuria: Secondary | ICD-10-CM | POA: Diagnosis not present

## 2022-09-19 ENCOUNTER — Ambulatory Visit (LOCAL_COMMUNITY_HEALTH_CENTER): Payer: Self-pay

## 2022-09-19 DIAGNOSIS — Z111 Encounter for screening for respiratory tuberculosis: Secondary | ICD-10-CM

## 2022-09-22 ENCOUNTER — Other Ambulatory Visit: Payer: Self-pay | Admitting: Urology

## 2022-09-22 ENCOUNTER — Ambulatory Visit (LOCAL_COMMUNITY_HEALTH_CENTER): Payer: Self-pay

## 2022-09-22 ENCOUNTER — Other Ambulatory Visit: Payer: Self-pay

## 2022-09-22 DIAGNOSIS — Z111 Encounter for screening for respiratory tuberculosis: Secondary | ICD-10-CM

## 2022-09-22 LAB — TB SKIN TEST
Induration: 0 mm
TB Skin Test: NEGATIVE

## 2022-09-25 NOTE — Patient Instructions (Addendum)
DUE TO COVID-19 ONLY TWO VISITORS  (aged 71 and older)  ARE ALLOWED TO COME WITH YOU AND STAY IN THE WAITING ROOM ONLY DURING PRE OP AND PROCEDURE.   **NO VISITORS ARE ALLOWED IN THE SHORT STAY AREA OR RECOVERY ROOM!!**  IF YOU WILL BE ADMITTED INTO THE HOSPITAL YOU ARE ALLOWED ONLY FOUR SUPPORT PEOPLE DURING VISITATION HOURS ONLY (7 AM -8PM)   The support person(s) must pass our screening, gel in and out, and wear a mask at all times, including in the patient's room. Patients must also wear a mask when staff or their support person are in the room. Visitors GUEST BADGE MUST BE WORN VISIBLY  One adult visitor may remain with you overnight and MUST be in the room by 8 P.M.     Your procedure is scheduled on: 10/17/22   Report to [facility] Main Entrance    Report to admitting at : 7:00 AM   Call this number if you have problems the morning of surgery [phone]   Do not eat food :After Midnight.   After Midnight you may have the following liquids until : 6:00 AM DAY OF SURGERY  Water Black Coffee (sugar ok, NO MILK/CREAM OR CREAMERS)  Tea (sugar ok, NO MILK/CREAM OR CREAMERS) regular and decaf                             Plain Jell-O (NO RED)                                           Fruit ices (not with fruit pulp, NO RED)                                     Popsicles (NO RED)                                                                  Juice: apple, WHITE grape, WHITE cranberry Sports drinks like Gatorade (NO RED)    Oral Hygiene is also important to reduce your risk of infection.                                    Remember - BRUSH YOUR TEETH THE MORNING OF SURGERY WITH YOUR REGULAR TOOTHPASTE  DENTURES WILL BE REMOVED PRIOR TO SURGERY PLEASE DO NOT APPLY "Poly grip" OR ADHESIVES!!!   Do NOT smoke after Midnight   Take these medicines the morning of surgery with A SIP OF WATER: isosorbide,carvedilol.Use inhalers as usual.   DO NOT TAKE ANY ORAL DIABETIC  MEDICATIONS DAY OF YOUR SURGERY. Hold jardiance 3 days before surgery. (10/13/22 last dose)                              You may not have any metal on your body including hair pins, jewelry, and body piercing  Do not wear make-up, lotions, powders, perfumes/cologne, or deodorant  Do not wear nail polish including gel and S&S, artificial/acrylic nails, or any other type of covering on natural nails including finger and toenails. If you have artificial nails, gel coating, etc. that needs to be removed by a nail salon please have this removed prior to surgery or surgery may need to be canceled/ delayed if the surgeon/ anesthesia feels like they are unable to be safely monitored.   Do not shave  48 hours prior to surgery.    Do not bring valuables to the hospital. [facility] IS NOT             RESPONSIBLE   FOR VALUABLES.   Contacts, glasses, or bridgework may not be worn into surgery.   Bring small overnight bag day of surgery.   DO NOT BRING YOUR HOME MEDICATIONS TO THE HOSPITAL. PHARMACY WILL DISPENSE MEDICATIONS LISTED ON YOUR MEDICATION LIST TO YOU DURING YOUR ADMISSION IN THE HOSPITAL!    Patients discharged on the day of surgery will not be allowed to drive home.  Someone NEEDS to stay with you for the first 24 hours after anesthesia.   Special Instructions: Bring a copy of your healthcare power of attorney and living will documents         the day of surgery if you haven't scanned them before.              Please read over the following fact sheets you were given: IF YOU HAVE QUESTIONS ABOUT YOUR PRE-OP INSTRUCTIONS PLEASE CALL [phone]    [facility] - Preparing for Surgery Before surgery, you can play an important role.  Because skin is not sterile, your skin needs to be as free of germs as possible.  You can reduce the number of germs on your skin by washing with CHG (chlorahexidine gluconate) soap before surgery.  CHG is an antiseptic cleaner which kills germs and  bonds with the skin to continue killing germs even after washing. Please DO NOT use if you have an allergy to CHG or antibacterial soaps.  If your skin becomes reddened/irritated stop using the CHG and inform your nurse when you arrive at Short Stay. Do not shave (including legs and underarms) for at least 48 hours prior to the first CHG shower.  You may shave your face/neck. Please follow these instructions carefully:  1.  Shower with CHG Soap the night before surgery and the  morning of Surgery.  2.  If you choose to wash your hair, wash your hair first as usual with your  normal  shampoo.  3.  After you shampoo, rinse your hair and body thoroughly to remove the  shampoo.                           4.  Use CHG as you would any other liquid soap.  You can apply chg directly  to the skin and wash                       Gently with a scrungie or clean washcloth.  5.  Apply the CHG Soap to your body ONLY FROM THE NECK DOWN.   Do not use on face/ open                           Wound or open sores. Avoid contact with eyes,  ears mouth and genitals (private parts).                       Wash face,  Genitals (private parts) with your normal soap.             6.  Wash thoroughly, paying special attention to the area where your surgery  will be performed.  7.  Thoroughly rinse your body with warm water from the neck down.  8.  DO NOT shower/wash with your normal soap after using and rinsing off  the CHG Soap.                9.  Pat yourself dry with a clean towel.            10.  Wear clean pajamas.            11.  Place clean sheets on your bed the night of your first shower and do not  sleep with pets. Day of Surgery : Do not apply any lotions/deodorants the morning of surgery.  Please wear clean clothes to the hospital/surgery center.  FAILURE TO FOLLOW THESE INSTRUCTIONS MAY RESULT IN THE CANCELLATION OF YOUR SURGERY PATIENT SIGNATURE_________________________________  NURSE  SIGNATURE__________________________________  ________________________________________________________________________

## 2022-09-25 NOTE — Progress Notes (Signed)
Pt. Needs orders for upcoming surgery. ?

## 2022-09-29 ENCOUNTER — Other Ambulatory Visit: Payer: Self-pay

## 2022-09-29 ENCOUNTER — Encounter (HOSPITAL_COMMUNITY): Payer: Self-pay

## 2022-09-29 ENCOUNTER — Encounter (HOSPITAL_COMMUNITY)
Admission: RE | Admit: 2022-09-29 | Discharge: 2022-09-29 | Disposition: A | Discharge status: Home / Self Care | Payer: PPO | Source: Ambulatory Visit | Attending: Urology | Admitting: Urology

## 2022-09-29 VITALS — BP 105/66 | HR 66 | Temp 97.9°F | Ht 63.0 in | Wt 244.0 lb

## 2022-09-29 DIAGNOSIS — I10 Essential (primary) hypertension: Secondary | ICD-10-CM | POA: Diagnosis not present

## 2022-09-29 DIAGNOSIS — E119 Type 2 diabetes mellitus without complications: Secondary | ICD-10-CM | POA: Insufficient documentation

## 2022-09-29 DIAGNOSIS — Z01818 Encounter for other preprocedural examination: Secondary | ICD-10-CM | POA: Diagnosis not present

## 2022-09-29 HISTORY — DX: Dyspnea, unspecified: R06.00

## 2022-09-29 HISTORY — DX: Unspecified osteoarthritis, unspecified site: M19.90

## 2022-09-29 HISTORY — DX: Personal history of urinary calculi: Z87.442

## 2022-09-29 LAB — CBC
HCT: 43.7 % (ref 36.0–46.0)
Hemoglobin: 13.8 g/dL (ref 12.0–15.0)
MCH: 26.3 pg (ref 26.0–34.0)
MCHC: 31.6 g/dL (ref 30.0–36.0)
MCV: 83.4 fL (ref 80.0–100.0)
Platelets: 206 10*3/uL (ref 150–400)
RBC: 5.24 MIL/uL — ABNORMAL HIGH (ref 3.87–5.11)
RDW: 15 % (ref 11.5–15.5)
WBC: 6.1 10*3/uL (ref 4.0–10.5)
nRBC: 0 % (ref 0.0–0.2)

## 2022-09-29 LAB — BASIC METABOLIC PANEL
Anion gap: 12 (ref 5–15)
BUN: 17 mg/dL (ref 8–23)
CO2: 25 mmol/L (ref 22–32)
Calcium: 9.4 mg/dL (ref 8.9–10.3)
Chloride: 102 mmol/L (ref 98–111)
Creatinine, Ser: 0.82 mg/dL (ref 0.44–1.00)
GFR, Estimated: >60 mL/min (ref >60)
Glucose, Bld: 110 mg/dL — ABNORMAL HIGH (ref 70–99)
Potassium: 4.1 mmol/L (ref 3.5–5.1)
Sodium: 139 mmol/L (ref 135–145)

## 2022-09-29 LAB — GLUCOSE, CAPILLARY: Glucose-Capillary: 123 mg/dL — ABNORMAL HIGH (ref 70–99)

## 2022-09-29 LAB — HEMOGLOBIN A1C
Hgb A1c MFr Bld: 6.1 % — ABNORMAL HIGH (ref 4.8–5.6)
Mean Plasma Glucose: 128.37 mg/dL

## 2022-09-29 NOTE — Progress Notes (Signed)
For Short Stay: COVID SWAB appointment date:  Bowel Prep reminder: N/A   For Anesthesia: PCP - DO: Irena Reichmann. Cardiologist - Dr. Tiajuana Amass. [facility] in [place].  Chest x-ray -  EKG -  Stress Test -  ECHO - 04/09/21 Cardiac Cath - 06/16/1997 Pacemaker/ICD device last checked: Pacemaker orders received: Device Rep notified:  Spinal Cord Stimulator:  Sleep Study - N/A CPAP -   Fasting Blood Sugar - N/A Checks Blood Sugar __0___ times a day Date and result of last Hgb A1c-  Last dose of GLP1 agonist- N/A GLP1 instructions:   Last dose of SGLT-2 inhibitors-  SGLT-2 instructions:   Blood Thinner Instructions: Dr. Cardell Peach Aspirin Instructions: Will be held 5 days before procedure. Last Dose:  Activity level: Can go up a flight of stairs and activities of daily living without stopping and without chest pain and/or shortness of breath   Able to exercise without chest pain and/or shortness of breath  Anesthesia review: Hx: HTN,CAD,CHF,COPD,DIA,Heart Murmur.  Patient denies shortness of breath, fever, cough and chest pain at PAT appointment   Patient verbalized understanding of instructions that were given to them at the PAT appointment. Patient was also instructed that they will need to review over the PAT instructions again at home before surgery.

## 2022-10-09 ENCOUNTER — Ambulatory Visit
Admission: RE | Admit: 2022-10-09 | Discharge: 2022-10-09 | Disposition: A | Discharge status: Home / Self Care | Payer: PPO | Source: Ambulatory Visit | Attending: Family | Admitting: Family

## 2022-10-09 DIAGNOSIS — I340 Nonrheumatic mitral (valve) insufficiency: Secondary | ICD-10-CM | POA: Diagnosis not present

## 2022-10-09 DIAGNOSIS — I5022 Chronic systolic (congestive) heart failure: Secondary | ICD-10-CM | POA: Diagnosis not present

## 2022-10-09 LAB — ECHOCARDIOGRAM COMPLETE
AR max vel: 2.47 cm2
AV Area VTI: 3.11 cm2
AV Area mean vel: 2.49 cm2
AV Mean grad: 3 mmHg
AV Peak grad: 5.8 mmHg
Ao pk vel: 1.2 m/s
Area-P 1/2: 2.41 cm2
MV VTI: 2.49 cm2
S' Lateral: 2.7 cm

## 2022-10-09 NOTE — Progress Notes (Signed)
*  PRELIMINARY RESULTS* Echocardiogram 2D Echocardiogram has been performed.  Sarah [name] 10/09/2022, 11:18 AM

## 2022-10-13 ENCOUNTER — Encounter: Payer: Self-pay | Admitting: Family

## 2022-10-13 ENCOUNTER — Telehealth (HOSPITAL_COMMUNITY): Payer: Self-pay

## 2022-10-13 ENCOUNTER — Ambulatory Visit: Payer: PRIVATE HEALTH INSURANCE | Attending: Family | Admitting: Family

## 2022-10-13 ENCOUNTER — Other Ambulatory Visit (HOSPITAL_COMMUNITY): Payer: Self-pay

## 2022-10-13 VITALS — BP 103/73 | HR 65 | Wt 247.6 lb

## 2022-10-13 DIAGNOSIS — I509 Heart failure, unspecified: Secondary | ICD-10-CM | POA: Insufficient documentation

## 2022-10-13 DIAGNOSIS — I110 Hypertensive heart disease with heart failure: Secondary | ICD-10-CM | POA: Diagnosis not present

## 2022-10-13 DIAGNOSIS — J449 Chronic obstructive pulmonary disease, unspecified: Secondary | ICD-10-CM | POA: Diagnosis not present

## 2022-10-13 DIAGNOSIS — E119 Type 2 diabetes mellitus without complications: Secondary | ICD-10-CM | POA: Insufficient documentation

## 2022-10-13 DIAGNOSIS — I5032 Chronic diastolic (congestive) heart failure: Secondary | ICD-10-CM

## 2022-10-13 DIAGNOSIS — R5383 Other fatigue: Secondary | ICD-10-CM | POA: Diagnosis present

## 2022-10-13 DIAGNOSIS — Z87891 Personal history of nicotine dependence: Secondary | ICD-10-CM | POA: Insufficient documentation

## 2022-10-13 DIAGNOSIS — Z79899 Other long term (current) drug therapy: Secondary | ICD-10-CM | POA: Insufficient documentation

## 2022-10-13 DIAGNOSIS — R1031 Right lower quadrant pain: Secondary | ICD-10-CM | POA: Insufficient documentation

## 2022-10-13 DIAGNOSIS — I10 Essential (primary) hypertension: Secondary | ICD-10-CM

## 2022-10-13 DIAGNOSIS — Z7984 Long term (current) use of oral hypoglycemic drugs: Secondary | ICD-10-CM | POA: Diagnosis not present

## 2022-10-13 NOTE — Progress Notes (Signed)
Patient ID: Sarah [name], female    DOB: June 09, 1951, 71 y.o.   MRN: [id number]  Primary cardiologist: Tiajuana Amass, MD (last seen 02/24; returns 08/24) PCP: Irena Reichmann, DO (last seen 02/24)  [name] is a 72 y/o female with a history of DM, HTN, COPD, previous tobacco use and chronic heart failure.   Echo 04/24: EF 60-65% along with mild MR. Echo 04/09/21: EF of 45-50% along with mild MR.   Has not been admitted or been in the ED in the last 6 months.   She presents today for a follow-up visit with a chief complaint of minimal fatigue with moderate exertion. Chronic in nature. Has associated RLQ pain due to kidney stones along with this. Denies difficulty sleeping, dizziness, abdominal distention, palpitations, pedal edema, chest pain, SOB, cough or weight gain.   Has been walking 10-20 minutes daily and says that her goal is to walk 30 minutes daily. Later this week, she will be having cystoscopy for her kidney stones.   She mentions that she no longer qualifies for jardiance patient assistance and is wondering if there are any options for this.   Past Medical History:  Diagnosis Date   Arthritis    Carotid artery disease (HCC)    Congestive heart failure (HCC)    COPD (chronic obstructive pulmonary disease) (HCC)    Diabetes mellitus without complication (HCC)    pt denies, however is on metformin daily   Dyspnea    HBP (high blood pressure)    Heart murmur    History of kidney stones    Past Surgical History:  Procedure Laterality Date   ABDOMINAL HYSTERECTOMY  06/16/1989   CARDIAC CATHETERIZATION  06/16/1997   CATARACT EXTRACTION W/ INTRAOCULAR LENS  IMPLANT, BILATERAL     COLONOSCOPY     COLONOSCOPY WITH PROPOFOL N/A 12/09/2021   Procedure: COLONOSCOPY WITH PROPOFOL;  Surgeon: Midge Minium, MD;  Location: [facility];  Service: Endoscopy;  Laterality: N/A;   ESOPHAGEAL DILATION N/A 02/11/2016   Procedure: ESOPHAGEAL DILATION;  Surgeon: Midge Minium, MD;   Location: [facility];  Service: Endoscopy;  Laterality: N/A;   ESOPHAGOGASTRODUODENOSCOPY (EGD) WITH PROPOFOL N/A 02/11/2016   Procedure: ESOPHAGOGASTRODUODENOSCOPY (EGD) WITH PROPOFOL;  Surgeon: Midge Minium, MD;  Location: [facility];  Service: Endoscopy;  Laterality: N/A;  DIABETIC   Family History  Problem Relation Age of Onset   Stroke Father    Hypertension Father    Hypertension Mother    Stroke Mother    Breast cancer Cousin        2 mat cousins   Breast cancer Cousin        pat cousin   Social History   Tobacco Use   Smoking status: Former    Packs/day: 0.50    Years: 27.00    Additional pack years: 0.00    Total pack years: 13.50    Types: Cigarettes    Quit date: 01/15/1996    Years since quitting: 26.7   Smokeless tobacco: Never  Substance Use Topics   Alcohol use: Yes    Alcohol/week: 2.0 - 3.0 standard drinks of alcohol    Types: 1 - 2 Glasses of wine, 1 Cans of beer per week    Comment: WINE WEEKLY   No Known Allergies  Prior to Admission medications   Medication Sig Start Date End Date Taking? Authorizing Provider  albuterol (VENTOLIN HFA) 108 (90 Base) MCG/ACT inhaler Inhale 1-2 puffs into the lungs every 6 (six) hours as  needed for wheezing or shortness of breath.   Yes [provider]  ascorbic acid (VITAMIN C) 1000 MG tablet Take 1,000 mg by mouth daily.   Yes [provider]  aspirin 81 MG tablet Take 81 mg by mouth daily.   Yes [provider]  carvedilol (COREG) 12.5 MG tablet Take 12.5 mg by mouth 2 (two) times daily with a meal.   Yes [provider]  Cholecalciferol (VITAMIN D) 50 MCG (2000 UT) CAPS Take 2,000 Units by mouth daily.   Yes [provider]  cyclobenzaprine (FLEXERIL) 5 MG tablet Take 5 mg by mouth at bedtime as needed. 09/03/21  Yes [provider]  furosemide (LASIX) 40 MG tablet Take 20 mg by mouth daily.   Yes [provider]  isosorbide mononitrate  (IMDUR) 30 MG 24 hr tablet Take 30 mg by mouth daily. 01/13/18  Yes [provider]  JARDIANCE 10 MG TABS tablet TAKE 1 TABLET BY MOUTH DAILY BEFORE BREAKFAST. 06/26/22  Yes Juliann Olesky A, FNP  Multiple Vitamins-Minerals (CENTRUM SILVER PO) Take 1 tablet by mouth daily.   Yes [provider]  simvastatin (ZOCOR) 40 MG tablet Take 40 mg by mouth at bedtime.   Yes [provider]  spironolactone (ALDACTONE) 25 MG tablet Take 0.5 tablets (12.5 mg total) by mouth daily. 04/12/21  Yes Wieting, Richard, MD  Tiotropium Bromide Monohydrate (SPIRIVA RESPIMAT) 2.5 MCG/ACT AERS Inhale 2 puffs into the lungs daily. 09/17/22  Yes Salena Saner, MD  tiZANidine (ZANAFLEX) 2 MG tablet Take 2 mg by mouth at bedtime. 02/18/22  Yes [provider]  valsartan (DIOVAN) 80 MG tablet Take 80 mg by mouth daily.   Yes [provider]  acetaminophen (TYLENOL) 500 MG tablet Take 500 mg by mouth every 6 (six) hours as needed for moderate pain.    [provider]  hydroxypropyl methylcellulose / hypromellose (ISOPTO TEARS / GONIOVISC) 2.5 % ophthalmic solution Place 1 drop into both eyes as needed for dry eyes.    [provider]  Menthol-Methyl Salicylate (SALONPAS PAIN RELIEF PATCH) PTCH Place 1 patch onto the skin daily as needed (pain).    [provider]    Review of Systems  Constitutional:  Positive for fatigue. Negative for appetite change.  HENT:  Negative for congestion, postnasal drip and sore throat.   Eyes: Negative.   Respiratory:  Negative for cough, chest tightness and shortness of breath.   Cardiovascular:  Negative for chest pain, palpitations and leg swelling.  Gastrointestinal:  Positive for abdominal pain (RLQ). Negative for abdominal distention.  Endocrine: Negative.   Genitourinary: Negative.   Musculoskeletal:  Negative for back pain and neck pain.  Skin: Negative.   Allergic/Immunologic: Negative.   Neurological:  Negative  for dizziness, syncope and light-headedness.  Hematological:  Negative for adenopathy. Does not bruise/bleed easily.  Psychiatric/Behavioral:  Negative for dysphoric mood and sleep disturbance. The patient is not nervous/anxious.    Vitals:   10/13/22 1010  BP: 103/73  Pulse: 65  SpO2: 97%  Weight: 247 lb 9.6 oz (112.3 kg)   Wt Readings from Last 3 Encounters:  10/13/22 247 lb 9.6 oz (112.3 kg)  09/29/22 244 lb (110.7 kg)  09/17/22 246 lb 6.4 oz (111.8 kg)   Lab Results  Component Value Date   CREATININE 0.82 09/29/2022   CREATININE 1.00 03/07/2022   CREATININE 0.91 04/11/2021   Physical Exam Vitals and nursing note reviewed.  Constitutional:      General: She  is not in acute distress.    Appearance: Normal appearance.  HENT:     Head: Normocephalic and atraumatic.  Cardiovascular:     Rate and Rhythm: Normal rate and regular rhythm.  Pulmonary:     Effort: Pulmonary effort is normal. No respiratory distress.     Breath sounds: No wheezing or rales.  Abdominal:     General: There is no distension.     Palpations: Abdomen is soft.  Musculoskeletal:        General: No tenderness.     Cervical back: Normal range of motion and neck supple.     Right lower leg: No edema.     Left lower leg: No edema.  Skin:    General: Skin is warm and dry.  Neurological:     General: No focal deficit present.     Mental Status: She is alert and oriented to person, place, and time.  Psychiatric:        Mood and Affect: Mood normal.        Behavior: Behavior normal.        Thought Content: Thought content normal.   Assessment & Plan:  1: Chronic heart failure with now preserved ejection fraction- - NYHA class II - euvolemic today - weighing daily; reminded to call for an overnight weight gain of > 2 pounds or a weekly weight gain of > 5 pounds - weight stable from last visit here 1 month ago; admits to eating more than she should be; reviewed importance of decreasing her eating  out - Echo 04/24: EF 60-65% along with mild MR. Echo 04/09/21: EF of 45-50% along with mild MR - reviewed improvement in her echo - walking 10-20 minutes daily; her goal is 30 minutes daily - not adding salt  - saw cardiology (Pendyal) 02/24 - continue carvedilol 12.5mg  BID - continue valsartan 80mg  daily; current BP will not tolerate changing this to entresto - continue jardiance 10mg  daily - continue spironolactone 12.5mg  daily - taking her furosemide if weight gets >231 pounds  - no longer qualifies for patient assistance for jardiance; have reached out to [place] pharmacist to see if she is eligible for any grants - BNP 04/08/21 was 223.9 - PharmD reconciled meds w/ patient  2: HTN- - BP 103/73 - saw PCP Thomasena Edis) 02/24 - BMP 08/06/22 (PCP office) reviewed and showed sodium 141, potassium 4.3, creatinine 0.98 & GFR 62  3: Stage 2 moderate COPD- - using spiriva - saw pulmonology Jayme Cloud) 04/24  4: DM- - A1c 01/28/22 was 6.0% - currently diet controlled. - saw [facility] 03/24 and urine showed hematuria; will be having cystoscopy later this week   Return in 11/24, sooner if needed.

## 2022-10-13 NOTE — Telephone Encounter (Signed)
Advanced Heart Failure Patient Advocate Encounter  Following up regarding medication assistance. Jardiance and Spiriva forms were completed for 2024 but patient was denied due to income. Patient has been approved for a grant that will cover the cost of Carvedilol and Jardiance, but options for Spiriva are limited at this time. Patient expressed understanding.  The patient was approved for a [employer] grant that will help cover the cost of Carvedilol, Jardiance.  Total amount awarded, $10,000.  Effective: 09/13/2022 - 09/12/2023.  BIN [id number] PCN [id number] Group [id number] ID [id number]  Billing information provided directly to [facility], spoke with patient by phone.  Burnell Blanks, CPhT Rx Patient Advocate Phone: [phone]

## 2022-10-13 NOTE — Progress Notes (Unsigned)
[facility] HEART FAILURE CLINIC - Pharmacist Education Note  Guideline-Directed Medical Therapy/Evidence Based Medicine ACE/ARB/ARNI: Valsartan 80 mg daily Beta Blocker: Carvedilol 12.5 mg twice daily Aldosterone Antagonist: Spironolactone 12.5 mg daily Diuretic: Furosemide 20 mg daily SGLT2i: Empagliflozin 10 mg daily  Adherence Assessment Do you ever forget to take your medication? [] Yes [x] No  Do you ever skip doses due to side effects? [] Yes [x] No  Do you have trouble affording your medicines? [] Yes [x] No  Are you ever unable to pick up your medication due to transportation difficulties? [] Yes [x] No  Do you ever stop taking your medications because you don't believe they are helping? [] Yes [x] No  Do you check your weight daily? [x] Yes [] No  Adherence strategy: Writes frequency & time of day on lid of pill bottles Barriers to obtaining medications: Recently lost patient assistance for Spiriva and Jardiance and now pays out of pocket or gets samples from physician's offices.  Vitals: BP 103/73, HR 65, Weight 247 lb ECHO: Date 09/29/2022, EF 60-65%, no RWMA, G1DD     Latest Ref Rng & Units 09/29/2022   11:00 AM 03/07/2022   10:56 AM 04/11/2021    5:23 AM  BMP  Glucose 70 - 99 mg/dL 962  90  952   BUN 8 - 23 mg/dL 17  20  13    Creatinine 0.44 - 1.00 mg/dL 8.41  3.24  4.01   Sodium 135 - 145 mmol/L 139  139  136   Potassium 3.5 - 5.1 mmol/L 4.1  4.0  4.2   Chloride 98 - 111 mmol/L 102  106  104   CO2 22 - 32 mmol/L 25  25  28    Calcium 8.9 - 10.3 mg/dL 9.4  9.2  8.8     Past Medical History:  Diagnosis Date   Arthritis    Carotid artery disease (HCC)    Congestive heart failure (HCC)    COPD (chronic obstructive pulmonary disease) (HCC)    Diabetes mellitus without complication (HCC)    pt denies, however is on metformin daily   Dyspnea    HBP (high blood pressure)    Heart murmur    History of kidney stones    Assessment:  Sarah [name] is a 72 y.o. female presenting  to the Heart Failure Clinic for follow up after echocardiogram. Her weight had previously been around 230 lbs in October 2023, but has increased over the past few months to 247 lbs today. She reports no signs or symptoms of fluid overload and attributes recent weight gain to increased snacking since retirement. She reports walking outside for ~30 mins daily and endorses a personal weight goal of about 230 lbs. She also noted that she will have a procedure soon for her kidney stones and hopes that this will improve her ability to urinate regularly.   Patient reports no issues with her current regimen though she does report that she recently lost patient assistance for Spiriva and Jardiance. She reports using samples from physician's offices and paying copay of >$100 for these medications if she needs to. Will discuss options to alleviate financial stress with patient advocate.  Given recent weight gain and comorbid T2DM, patient would be a candidate for GLP-1. We discussed this as an option with the patient and explained the benefits of further blood glucose control, weight loss, and possible cardioprotection. Questions about injection technique and schedule were answered. She stated that she is not currently interested, but she will keep an open mind.  Recent ED Visit (past  6 months): none  Plan: CHF Continue valsartan 80 mg daily, empagliflozin 10 mg daily, spironolactone 12.5 mg daily, carvedilol 12.5 mg twice daily, furosemide 20 mg daily, Imdur 30 mg daily Continue weight loss efforts with diet and exercise  Patient advocate to investigate grant availability and coordinate with patient and pharmacy   HLD Continue simvastatin 40 mg daily  T2DM Continue empagliflozin 10 mg daily Consider GLP-1 for weight loss, blood glucose control, and potential cardioprotection  Time spent: 15 minutes  Celene Squibb, PharmD PGY1 Pharmacy Resident 10/13/2022 10:39 AM

## 2022-10-14 NOTE — Progress Notes (Signed)
Pt. Was contacted to reminded her to hold jardiance starting today.Last dose: 10/13/22

## 2022-10-14 NOTE — Progress Notes (Signed)
Choose an anesthesia record to view details        DISCUSSION: Sarah [name] is a 72 yo female who presents to PAT clinic prior to cystoscopy. Patient has a hx of previous tobacco use (quit 1997), CHF, HTN, COPD, DM. She follows with Cardiology and last had an Echo on 10/09/22 which showed EF lf 60-65% and mild MR. Noted to be stable and euvolemic at her Cardiology OV on 4/29. Also follows with Cardiology at [facility] (Dr. Lorelle Gibbs) - last seen 08/07/22 and was stable at that visit. Advised to continue medical therapy.  Last saw Pulmonology on 04/11/22 and was stable at that visit.  VS: BP 105/66   Pulse 66   Temp 36.6 C (Oral)   Ht 5\' 3"  (1.6 m)   Wt 110.7 kg   SpO2 97%   BMI 43.22 kg/m   PROVIDERS: Irena Reichmann, DO Cardiology: Dr. Tiajuana Amass Cardiology (HF clinic): Clarisa Kindred, NP   LABS: Labs reviewed: Acceptable for surgery. (all labs ordered are listed, but only abnormal results are displayed)  Labs Reviewed  GLUCOSE, CAPILLARY - Abnormal; Notable for the following components:      Result Value   Glucose-Capillary 123 (*)    All other components within normal limits  CBC - Abnormal; Notable for the following components:   RBC 5.24 (*)    All other components within normal limits  HEMOGLOBIN A1C - Abnormal; Notable for the following components:   Hgb A1c MFr Bld 6.1 (*)    All other components within normal limits  BASIC METABOLIC PANEL - Abnormal; Notable for the following components:   Glucose, Bld 110 (*)    All other components within normal limits     IMAGES:  CXR 05/07/21:  IMPRESSION: Improved aeration compared to the prior, with no evidence on the current of acute cardiopulmonary disease   EKG 09/29/22:  NSR Cannot rule out anterior infarct, age undetermined Appears similar to prior EKG from 04/11/21   CV:   Echo 10/09/22:  IMPRESSIONS     1. Left ventricular ejection fraction, by estimation, is 60 to 65%. The  left ventricle has normal  function. The left ventricle has no regional  wall motion abnormalities. Left ventricular diastolic parameters are  consistent with Grade I diastolic  dysfunction (impaired relaxation).   2. Right ventricular systolic function is normal. The right ventricular  size is normal.   3. The mitral valve is normal in structure. Mild mitral valve  regurgitation. No evidence of mitral stenosis.   4. The aortic valve has an indeterminant number of cusps. Aortic valve  regurgitation is not visualized. No aortic stenosis is present.   5. The inferior vena cava is normal in size with greater than 50%  respiratory variability, suggesting right atrial pressure of 3 mmHg.   Past Medical History:  Diagnosis Date   Arthritis    Carotid artery disease (HCC)    Congestive heart failure (HCC)    COPD (chronic obstructive pulmonary disease) (HCC)    Diabetes mellitus without complication (HCC)    pt denies, however is on metformin daily   Dyspnea    HBP (high blood pressure)    Heart murmur    History of kidney stones     Past Surgical History:  Procedure Laterality Date   ABDOMINAL HYSTERECTOMY  06/16/1989   CARDIAC CATHETERIZATION  06/16/1997   CATARACT EXTRACTION W/ INTRAOCULAR LENS  IMPLANT, BILATERAL     COLONOSCOPY     COLONOSCOPY WITH PROPOFOL N/A 12/09/2021  Procedure: COLONOSCOPY WITH PROPOFOL;  Surgeon: Midge Minium, MD;  Location: [facility];  Service: Endoscopy;  Laterality: N/A;   ESOPHAGEAL DILATION N/A 02/11/2016   Procedure: ESOPHAGEAL DILATION;  Surgeon: Midge Minium, MD;  Location: [facility];  Service: Endoscopy;  Laterality: N/A;   ESOPHAGOGASTRODUODENOSCOPY (EGD) WITH PROPOFOL N/A 02/11/2016   Procedure: ESOPHAGOGASTRODUODENOSCOPY (EGD) WITH PROPOFOL;  Surgeon: Midge Minium, MD;  Location: [facility];  Service: Endoscopy;  Laterality: N/A;  DIABETIC    MEDICATIONS:  acetaminophen (TYLENOL) 500 MG tablet   albuterol (VENTOLIN HFA) 108 (90 Base)  MCG/ACT inhaler   ascorbic acid (VITAMIN C) 1000 MG tablet   aspirin 81 MG tablet   carvedilol (COREG) 12.5 MG tablet   Cholecalciferol (VITAMIN D) 50 MCG (2000 UT) CAPS   cyclobenzaprine (FLEXERIL) 5 MG tablet   furosemide (LASIX) 40 MG tablet   hydroxypropyl methylcellulose / hypromellose (ISOPTO TEARS / GONIOVISC) 2.5 % ophthalmic solution   isosorbide mononitrate (IMDUR) 30 MG 24 hr tablet   JARDIANCE 10 MG TABS tablet   Menthol-Methyl Salicylate (SALONPAS PAIN RELIEF PATCH) PTCH   Multiple Vitamins-Minerals (CENTRUM SILVER PO)   simvastatin (ZOCOR) 40 MG tablet   spironolactone (ALDACTONE) 25 MG tablet   Tiotropium Bromide Monohydrate (SPIRIVA RESPIMAT) 2.5 MCG/ACT AERS   tiZANidine (ZANAFLEX) 2 MG tablet   valsartan (DIOVAN) 80 MG tablet   No current facility-administered medications for this encounter.    Marcille Blanco [facility] Surgical Short Stay/Anesthesiology [facility] Phone [phone] 10/14/2022 9:07 AM

## 2022-10-17 ENCOUNTER — Ambulatory Visit (HOSPITAL_COMMUNITY): Payer: PPO | Admitting: Medical

## 2022-10-17 ENCOUNTER — Ambulatory Visit (HOSPITAL_COMMUNITY): Payer: PPO

## 2022-10-17 ENCOUNTER — Encounter (HOSPITAL_COMMUNITY)
Admission: RE | Disposition: A | Discharge status: Home / Self Care | Payer: Self-pay | Source: Ambulatory Visit | Attending: Urology

## 2022-10-17 ENCOUNTER — Encounter (HOSPITAL_COMMUNITY): Payer: Self-pay | Admitting: Urology

## 2022-10-17 ENCOUNTER — Other Ambulatory Visit: Payer: Self-pay

## 2022-10-17 ENCOUNTER — Ambulatory Visit (HOSPITAL_COMMUNITY)
Admission: RE | Admit: 2022-10-17 | Discharge: 2022-10-17 | Disposition: A | Discharge status: Home / Self Care | Payer: PPO | Source: Ambulatory Visit | Attending: Urology | Admitting: Urology

## 2022-10-17 ENCOUNTER — Ambulatory Visit (HOSPITAL_BASED_OUTPATIENT_CLINIC_OR_DEPARTMENT_OTHER): Payer: PPO | Admitting: Physician Assistant

## 2022-10-17 DIAGNOSIS — I700 Atherosclerosis of aorta: Secondary | ICD-10-CM | POA: Insufficient documentation

## 2022-10-17 DIAGNOSIS — Z87891 Personal history of nicotine dependence: Secondary | ICD-10-CM | POA: Insufficient documentation

## 2022-10-17 DIAGNOSIS — Z7984 Long term (current) use of oral hypoglycemic drugs: Secondary | ICD-10-CM | POA: Diagnosis not present

## 2022-10-17 DIAGNOSIS — E1151 Type 2 diabetes mellitus with diabetic peripheral angiopathy without gangrene: Secondary | ICD-10-CM | POA: Insufficient documentation

## 2022-10-17 DIAGNOSIS — N200 Calculus of kidney: Secondary | ICD-10-CM | POA: Insufficient documentation

## 2022-10-17 DIAGNOSIS — Z8249 Family history of ischemic heart disease and other diseases of the circulatory system: Secondary | ICD-10-CM | POA: Insufficient documentation

## 2022-10-17 DIAGNOSIS — I509 Heart failure, unspecified: Secondary | ICD-10-CM | POA: Diagnosis not present

## 2022-10-17 DIAGNOSIS — M1990 Unspecified osteoarthritis, unspecified site: Secondary | ICD-10-CM | POA: Diagnosis not present

## 2022-10-17 DIAGNOSIS — J449 Chronic obstructive pulmonary disease, unspecified: Secondary | ICD-10-CM | POA: Diagnosis not present

## 2022-10-17 DIAGNOSIS — E785 Hyperlipidemia, unspecified: Secondary | ICD-10-CM | POA: Diagnosis not present

## 2022-10-17 DIAGNOSIS — I110 Hypertensive heart disease with heart failure: Secondary | ICD-10-CM | POA: Insufficient documentation

## 2022-10-17 DIAGNOSIS — I2510 Atherosclerotic heart disease of native coronary artery without angina pectoris: Secondary | ICD-10-CM | POA: Insufficient documentation

## 2022-10-17 HISTORY — PX: CYSTOSCOPY/URETEROSCOPY/HOLMIUM LASER/STENT PLACEMENT: SHX6546

## 2022-10-17 LAB — GLUCOSE, CAPILLARY
Glucose-Capillary: 99 mg/dL (ref 70–99)
Glucose-Capillary: 101 mg/dL — ABNORMAL HIGH (ref 70–99)

## 2022-10-17 SURGERY — CYSTOSCOPY/URETEROSCOPY/HOLMIUM LASER/STENT PLACEMENT
Anesthesia: General | Laterality: Right

## 2022-10-17 MED ORDER — MIDAZOLAM HCL 2 MG/2ML IJ SOLN
INTRAMUSCULAR | Status: AC
  Filled 2022-10-17: qty 2

## 2022-10-17 MED ORDER — OXYCODONE HCL 5 MG/5ML PO SOLN: 5.0000 mg | Freq: Once | ORAL | Status: DC | PRN

## 2022-10-17 MED ORDER — ONDANSETRON HCL 4 MG/2ML IJ SOLN: 4.0000 mg | Freq: Once | INTRAMUSCULAR | Status: DC | PRN

## 2022-10-17 MED ORDER — FENTANYL CITRATE (PF) 100 MCG/2ML IJ SOLN
INTRAMUSCULAR | Status: DC | PRN
  Administered 2022-10-17: 50 ug via INTRAVENOUS
  Administered 2022-10-17 (×2): 25 ug via INTRAVENOUS

## 2022-10-17 MED ORDER — ONDANSETRON HCL 4 MG/2ML IJ SOLN
INTRAMUSCULAR | Status: DC | PRN
  Administered 2022-10-17: 4 mg via INTRAVENOUS

## 2022-10-17 MED ORDER — ACETAMINOPHEN 160 MG/5ML PO SOLN: 325.0000 mg | ORAL | Status: DC | PRN

## 2022-10-17 MED ORDER — PROPOFOL 10 MG/ML IV BOLUS
INTRAVENOUS | Status: AC
  Filled 2022-10-17: qty 20

## 2022-10-17 MED ORDER — ACETAMINOPHEN 500 MG PO TABS
1000.0000 mg | ORAL_TABLET | Freq: Once | ORAL | Status: AC
  Administered 2022-10-17: 1000 mg via ORAL
  Filled 2022-10-17: qty 2

## 2022-10-17 MED ORDER — ONDANSETRON HCL 4 MG/2ML IJ SOLN
INTRAMUSCULAR | Status: AC
  Filled 2022-10-17: qty 2

## 2022-10-17 MED ORDER — OXYCODONE HCL 5 MG PO TABS: 5.0000 mg | ORAL_TABLET | Freq: Once | ORAL | Status: DC | PRN

## 2022-10-17 MED ORDER — DOCUSATE SODIUM 100 MG PO CAPS: 100.0000 mg | ORAL_CAPSULE | Freq: Every day | ORAL | 0 refills | Status: DC | PRN

## 2022-10-17 MED ORDER — CHLORHEXIDINE GLUCONATE 0.12 % MT SOLN
15.0000 mL | Freq: Once | OROMUCOSAL | Status: AC
  Administered 2022-10-17: 15 mL via OROMUCOSAL

## 2022-10-17 MED ORDER — OXYCODONE HCL 5 MG PO TABS
5.0000 mg | ORAL_TABLET | Freq: Once | ORAL | Status: AC | PRN
  Administered 2022-10-17: 5 mg via ORAL

## 2022-10-17 MED ORDER — LACTATED RINGERS IV SOLN
INTRAVENOUS | Status: DC
  Administered 2022-10-17: 1000 mL via INTRAVENOUS

## 2022-10-17 MED ORDER — CEFAZOLIN SODIUM-DEXTROSE 2-4 GM/100ML-% IV SOLN
2.0000 g | INTRAVENOUS | Status: AC
  Administered 2022-10-17: 2 g via INTRAVENOUS
  Filled 2022-10-17: qty 100

## 2022-10-17 MED ORDER — DEXAMETHASONE SODIUM PHOSPHATE 10 MG/ML IJ SOLN
INTRAMUSCULAR | Status: AC
  Filled 2022-10-17: qty 1

## 2022-10-17 MED ORDER — OXYCODONE-ACETAMINOPHEN 5-325 MG PO TABS: 1.0000 | ORAL_TABLET | ORAL | 0 refills | Status: DC | PRN

## 2022-10-17 MED ORDER — CELECOXIB 200 MG PO CAPS
200.0000 mg | ORAL_CAPSULE | Freq: Once | ORAL | Status: AC
  Administered 2022-10-17: 200 mg via ORAL
  Filled 2022-10-17: qty 1

## 2022-10-17 MED ORDER — MIDAZOLAM HCL 5 MG/5ML IJ SOLN
INTRAMUSCULAR | Status: DC | PRN
  Administered 2022-10-17: 2 mg via INTRAVENOUS

## 2022-10-17 MED ORDER — MEPERIDINE HCL 50 MG/ML IJ SOLN: 6.2500 mg | INTRAMUSCULAR | Status: DC | PRN

## 2022-10-17 MED ORDER — LIDOCAINE HCL (PF) 2 % IJ SOLN
INTRAMUSCULAR | Status: AC
  Filled 2022-10-17: qty 5

## 2022-10-17 MED ORDER — DEXMEDETOMIDINE HCL IN NACL 200 MCG/50ML IV SOLN
INTRAVENOUS | Status: DC | PRN
  Administered 2022-10-17 (×2): 4 ug via INTRAVENOUS

## 2022-10-17 MED ORDER — IOHEXOL 300 MG/ML  SOLN
INTRAMUSCULAR | Status: DC | PRN
  Administered 2022-10-17: 7 mL

## 2022-10-17 MED ORDER — ORAL CARE MOUTH RINSE: 15.0000 mL | Freq: Once | OROMUCOSAL | Status: AC

## 2022-10-17 MED ORDER — FENTANYL CITRATE (PF) 100 MCG/2ML IJ SOLN
INTRAMUSCULAR | Status: AC
  Filled 2022-10-17: qty 2

## 2022-10-17 MED ORDER — DEXAMETHASONE SODIUM PHOSPHATE 4 MG/ML IJ SOLN
INTRAMUSCULAR | Status: DC | PRN
  Administered 2022-10-17: 4 mg via INTRAVENOUS

## 2022-10-17 MED ORDER — OXYCODONE HCL 5 MG/5ML PO SOLN: 5.0000 mg | Freq: Once | ORAL | Status: AC | PRN

## 2022-10-17 MED ORDER — ACETAMINOPHEN 325 MG PO TABS: 325.0000 mg | ORAL_TABLET | ORAL | Status: DC | PRN

## 2022-10-17 MED ORDER — PROPOFOL 10 MG/ML IV BOLUS
INTRAVENOUS | Status: DC | PRN
  Administered 2022-10-17: 150 mg via INTRAVENOUS

## 2022-10-17 MED ORDER — FENTANYL CITRATE PF 50 MCG/ML IJ SOSY
25.0000 ug | PREFILLED_SYRINGE | INTRAMUSCULAR | Status: DC | PRN
  Administered 2022-10-17: 25 ug via INTRAVENOUS
  Administered 2022-10-17: 50 ug via INTRAVENOUS

## 2022-10-17 MED ORDER — FENTANYL CITRATE PF 50 MCG/ML IJ SOSY
PREFILLED_SYRINGE | INTRAMUSCULAR | Status: AC
  Filled 2022-10-17: qty 1

## 2022-10-17 MED ORDER — OXYCODONE HCL 5 MG PO TABS
ORAL_TABLET | ORAL | Status: AC
  Filled 2022-10-17: qty 1

## 2022-10-17 MED ORDER — SODIUM CHLORIDE 0.9 % IR SOLN
Status: DC | PRN
  Administered 2022-10-17: 3000 mL

## 2022-10-17 MED ORDER — LIDOCAINE HCL (CARDIAC) PF 100 MG/5ML IV SOSY
PREFILLED_SYRINGE | INTRAVENOUS | Status: DC | PRN
  Administered 2022-10-17: 60 mg via INTRAVENOUS

## 2022-10-17 MED ORDER — DEXMEDETOMIDINE HCL IN NACL 80 MCG/20ML IV SOLN
INTRAVENOUS | Status: AC
  Filled 2022-10-17: qty 20

## 2022-10-17 SURGICAL SUPPLY — 26 items
APL SKNCLS STERI-STRIP NONHPOA (GAUZE/BANDAGES/DRESSINGS)
BAG URO CATCHER STRL LF (MISCELLANEOUS) ×1 IMPLANT
BASKET ZERO TIP NITINOL 2.4FR (BASKET) IMPLANT
BENZOIN TINCTURE PRP APPL 2/3 (GAUZE/BANDAGES/DRESSINGS) IMPLANT
BSKT STON RTRVL ZERO TP 2.4FR (BASKET) ×1
CATH URETERAL DUAL LUMEN 10F (MISCELLANEOUS) IMPLANT
CATH URETL OPEN 5X70 (CATHETERS) ×1 IMPLANT
CLOTH BEACON ORANGE TIMEOUT ST (SAFETY) ×1 IMPLANT
DRSG TEGADERM 2-3/8X2-3/4 SM (GAUZE/BANDAGES/DRESSINGS) IMPLANT
FIBER LASER MOSES 200 DFL (Laser) IMPLANT
GLOVE BIOGEL M 7.0 STRL (GLOVE) ×1 IMPLANT
GOWN STRL REUS W/ TWL XL LVL3 (GOWN DISPOSABLE) ×1 IMPLANT
GOWN STRL REUS W/TWL XL LVL3 (GOWN DISPOSABLE) ×1
GUIDEWIRE STR DUAL SENSOR (WIRE) ×2 IMPLANT
GUIDEWIRE ZIPWRE .038 STRAIGHT (WIRE) IMPLANT
KIT TURNOVER KIT A (KITS) IMPLANT
LASER FIB FLEXIVA PULSE ID 365 (Laser) IMPLANT
MANIFOLD NEPTUNE II (INSTRUMENTS) ×1 IMPLANT
PACK CYSTO (CUSTOM PROCEDURE TRAY) ×1 IMPLANT
SHEATH DILATOR SET 8/10 (MISCELLANEOUS) IMPLANT
SHEATH NAVIGATOR HD 12/14X46 (SHEATH) IMPLANT
STENT URET 6FRX24 CONTOUR (STENTS) IMPLANT
TRACTIP FLEXIVA PULS ID 200XHI (Laser) IMPLANT
TRACTIP FLEXIVA PULSE ID 200 (Laser)
TUBING CONNECTING 10 (TUBING) ×1 IMPLANT
TUBING UROLOGY SET (TUBING) ×1 IMPLANT

## 2022-10-17 NOTE — H&P (Signed)
Office Visit Report     09/18/2022   --------------------------------------------------------------------------------   Sarah Ludwig. [name]  MRN: [id number]  DOB: [birth year], 72 year old Female  SSN:    PRIMARY CARE:     REFERRING:  Jannifer Hick, MD  PROVIDER:  Jettie Pagan, M.D.  LOCATION:  [facility] [phone]     --------------------------------------------------------------------------------   CC/HPI: Sarah [name] is a 72 year old female who is seen in follow-up today with a right renal stone.   #1. Microscopic hematuria:  She was seen by her PCP and found to have evidence of microscopic hematuria on 07/23/2022 with 10-20 RBC/hpf on her urinalysis. She denies history of gross hematuria. She does report history of smoking smoking 1/2 pack/day for 20 years. She quit in 1997. She denies family history of urologic malignancy. She denies history of urolithiasis.  -CT hematuria protocol 09/2022 with 4 mm right upper pole stone  -Cystoscopy/09/2022 unremarkable.   #2. Pelvic pain:  -Abdominal ultrasound 11/2018 with no mass or hydronephrosis visualized. No evidence of renal stone.  -Recently, she has noticed some right lower quadrant pain. She denies flank pain. She also notes pain in her pelvis that she localizes in her vagina. She states that the pain is exacerbated with defecation and alleviated with rest.  -She has had a prior hysterectomy.   #3. Urinary urgency with urinary urge incontinence: She has a long history of urinary urgency. She states that often, she experiences urge urinary incontinence. She does take furosemide and notes that this exacerbates her incontinence. She does drink some caffeine. She also takes furosemide. She is interested in starting a beta agonist. PVR today .   4. Right renal stone: CT hematuria protocol 09/16/2022 with 4 mm right upper pole stone. She does have some right-sided pelvic pain however denies flank pain. She would like to  treat the stone with ureteroscopy.   Patient currently denies fever, chills, sweats, nausea, vomiting, abdominal or flank pain, gross hematuria or dysuria.   She has a past medical history of CAD/CHF, COPD, prediabetes HTN, HLD.     ALLERGIES: No Allergies    MEDICATIONS: Gemtesa 75 mg tablet 1 tablet PO Daily  Simvastatin  Carvedilol  Centrum Adults  Cyclobenzaprine Hcl  Furosemide  Isosorbide Mononitrate  Jardiance  Spiriva Respimat  Spironolactone  Tizanidine Hcl  Valsartan  Vitamin C  Vitamin D3     GU PSH: Hysterectomy Locm 300-399Mg /Ml Iodine,1Ml - 09/16/2022     NON-GU PSH: No Non-GU PSH    GU PMH: Microscopic hematuria - 09/16/2022, - 08/26/2022 Pelvic/perineal pain - 08/26/2022 Urge incontinence - 08/26/2022 Urinary Urgency - 08/26/2022      PMH Notes: COPD  CHF    NON-GU PMH: Cardiac murmur, unspecified GERD Heart disease, unspecified Hypercholesterolemia Hypertension Right heart failure, unspecified    FAMILY HISTORY: 2 daughters - Runs in Family 1 son - Runs in Family Heart Attack - Brother Kidney Failure - Brother Prostate Cancer - Runs in Family stroke - Mother, Father   SOCIAL HISTORY: Marital Status: Married Preferred Language: English; Ethnicity: Not Hispanic Or Latino; Race: Black or African American Current Smoking Status: Patient does not smoke anymore. Has not smoked since 08/15/1995. Smoked for 25 years. Smoked 1/2 pack per day.   Tobacco Use Assessment Completed: Used Tobacco in last 30 days? Has never drank.  Drinks 1 caffeinated drink per day.    REVIEW OF SYSTEMS:    GU Review Female:   Patient denies frequent urination, hard to postpone urination, burning /  pain with urination, get up at night to urinate, leakage of urine, stream starts and stops, trouble starting your stream, have to strain to urinate, and being pregnant.  Gastrointestinal (Upper):   Patient denies nausea, vomiting, and indigestion/ heartburn.  Gastrointestinal  (Lower):   Patient denies constipation and diarrhea.  Constitutional:   Patient denies fever, night sweats, weight loss, and fatigue.  Skin:   Patient denies skin rash/ lesion and itching.  Eyes:   Patient denies blurred vision and double vision.  Ears/ Nose/ Throat:   Patient denies sore throat and sinus problems.  Hematologic/Lymphatic:   Patient denies swollen glands and easy bruising.  Cardiovascular:   Patient denies leg swelling and chest pains.  Respiratory:   Patient denies cough and shortness of breath.  Endocrine:   Patient denies excessive thirst.  Musculoskeletal:   Patient denies back pain and joint pain.  Neurological:   Patient denies headaches and dizziness.  Psychologic:   Patient denies depression and anxiety.   VITAL SIGNS: None   MULTI-SYSTEM PHYSICAL EXAMINATION:    Constitutional: Well-nourished. No physical deformities. Normally developed. Good grooming.  Respiratory: No labored breathing, no use of accessory muscles.   Cardiovascular: Normal temperature, normal extremity pulses, no swelling, no varicosities.  Gastrointestinal: No mass, no tenderness, no rigidity, non obese abdomen.     Complexity of Data:  Source Of History:  Patient, Medical Record Summary  Records Review:   Previous Doctor Records, Previous Patient Records  Urine Test Review:   Urinalysis  X-Ray Review: C.T. Abdomen/Pelvis: Reviewed Films. Reviewed Report. Discussed With Patient.    Notes:                     CLINICAL DATA: Microscopic hematuria.   EXAM:  CT ABDOMEN AND PELVIS WITHOUT AND WITH CONTRAST   TECHNIQUE:  Multidetector CT imaging of the abdomen and pelvis was performed  following the standard protocol before and following the bolus  administration of intravenous contrast.   RADIATION DOSE REDUCTION: This exam was performed according to the  departmental dose-optimization program which includes automated  exposure control, adjustment of the mA and/or kV according to   patient size and/or use of iterative reconstruction technique.   CONTRAST: 125 mL Omnipaque 300   COMPARISON: None Available.   FINDINGS:  Lower Chest: No acute findings.   Hepatobiliary: No hepatic masses identified. Gallbladder is  unremarkable. No evidence of biliary ductal dilatation.   Pancreas: No mass or inflammatory changes.   Spleen: Within normal limits in size and appearance.   Adrenals/Urinary Tract: No adrenal masses identified. 4 mm calculus  seen in upper pole of right kidney. No evidence of urolithiasis or  hydronephrosis. No suspicious renal masses identified. No masses  seen involving the collecting systems, ureters, or bladder.   Stomach/Bowel: No evidence of obstruction, inflammatory process or  abnormal fluid collections. Normal appendix visualized.   Vascular/Lymphatic: No pathologically enlarged lymph nodes. No acute  vascular findings. Aortic atherosclerotic calcification incidentally  noted.   Reproductive: Prior hysterectomy noted. Adnexal regions are  unremarkable in appearance.   Other: None.   Musculoskeletal: No suspicious bone lesions identified.   IMPRESSION:  4 mm right renal calculus. No evidence of ureteral calculi,  hydronephrosis, or other acute findings.   No radiographic evidence of urinary tract neoplasm.   Aortic Atherosclerosis (ICD10-I70.0).    Electronically Signed  By: Danae Orleans M.D.  On: 09/16/2022 11:56     PROCEDURES:  Flexible Cystoscopy - 52000  Risks, benefits, and some of the potential complications of the procedure were discussed at length with the patient including infection, bleeding, voiding discomfort, urinary retention, fever, chills, sepsis, and others. All questions were answered. Informed consent was obtained. Antibiotic prophylaxis was given. Sterile technique and intraurethral analgesia were used.  Meatus:  Normal size. Normal location. Normal condition.  Urethra:  No hypermobility. No  leakage.  Ureteral Orifices:  Normal location. Normal size. Normal shape. Effluxed clear urine.  Bladder:  No trabeculation. No tumors. Normal mucosa. No stones.      The lower urinary tract was carefully examined. The procedure was well-tolerated and without complications. Antibiotic instructions were given. Instructions were given to call the office immediately for bloody urine, difficulty urinating, urinary retention, painful or frequent urination, fever, chills, nausea, vomiting or other illness. The patient stated that she understood these instructions and would comply with them.         Urinalysis Dipstick Dipstick Cont'd  Color: Yellow Bilirubin: Neg mg/dL  Appearance: Clear Ketones: Neg mg/dL  Specific Gravity: 1.610 Blood: Neg ery/uL  pH: 6.0 Protein: Neg mg/dL  Glucose: 2+ mg/dL Urobilinogen: 0.2 mg/dL    Nitrites: Neg    Leukocyte Esterase: Neg leu/uL    ASSESSMENT:      ICD-10 Details  1 GU:   Microscopic hematuria - R31.21   2   Urinary Urgency - R39.15   3   Renal calculus - N20.0    PLAN:           Document Letter(s):  Created for Patient: Clinical Summary         Notes:   #1. Microscopic hematuria:  -Negative valuation today other than small right renal stone as above.   #2. Pelvic pain:  -Physical exam: Unremarkable  -Will send urine for culture and treat if positive  -Offered pelvic floor physical therapy. She declines at this time.   3. Urinary urgency with urinary incontinence: Discussed behavioral therapy. Also discussed overactive bladder medications.   Discussed OAB in terms of pathophysiology, diagnosis, and treatment. We discussed the AUA guidelines for evaluation and treatment of OAB, with focus on treatment with first, second and third line therapy.   Initial therapies are conservative, consisting of a combination of behavioral management, fluid management, and sometimes PFPT. We discussed limiting fluids after dinner and avoiding caffeine.  Additionally we discussed elevating legs one hour before bed and good voiding habits. Bladder retraining was discussed.   Second line therapy consists of medication. Options for medication, drug classes, risks and benefits were discussed in detail. We discussed risks for dementia with anticholinergic use. We discussed the list of medications, and that insurance coverage is variable. If a prescribed medication is not covered by the patient's insurance, they understand they can contact their insurer to determine an alternate. They can then let our office know and if appropriate will prescribe the alternate.   If first and second line therapy are ineffective, third line therapy, generally consisting of procedures is considered. Prior to initiation of third line therapy, further evaluation is warranted. Cystoscopy and UDS may be an important part of this evaluation. Consideration will then be given to use of intravesical Botox, PTNS, or Interstim, which were briefly discussed today.   #4. Right renal stone: We discussed options including surveillance, ureteroscopy or ESWL. She would proceed with ureteroscopy with laser lithotripsy basket extraction of stone. Surgery letter sent. Discussed risk benefits.   We discussed the options for management of kidney  stones, including observation, ESWL, ureteroscopy with laser lithotripsy, and PCNL. The risks and benefits of each option were discussed.  For observation I described the risks which include but are not limited to silent renal damage, life-threatening infection, need for emergent surgery, failure to pass stone, and pain.   ESWL: risks and benefits of ESWL were outlined including infection, bleeding, pain, steinstrasse, kidney injury, need for ancillary treatments, and global anesthesia risks including but not limited to CVA, MI, DVT, PE, pneumonia, and death.   Ureteroscopy: risks and benefits of ureteroscopy were outlined, including infection, bleeding,  pain, temporary ureteral stent and associated stent bother, ureteral injury, ureteral stricture, need for ancillary treatments, and global anesthesia risks including but not limited to CVA, MI, DVT, PE, pneumonia, and death.   CC: Irena Reichmann    Urology Preoperative H&P   Chief Complaint: right renal stone  History of Present Illness: Lakasha Beatie is a 72 y.o. female with a right renal stone here for cysto, R RPG, R URS/LL, R stent placement. Denies fevers, chills, dsyuria.    Past Medical History:  Diagnosis Date   Arthritis    Carotid artery disease (HCC)    Congestive heart failure (HCC)    COPD (chronic obstructive pulmonary disease) (HCC)    Diabetes mellitus without complication (HCC)    pt denies, however is on metformin daily   Dyspnea    HBP (high blood pressure)    Heart murmur    History of kidney stones     Past Surgical History:  Procedure Laterality Date   ABDOMINAL HYSTERECTOMY  06/16/1989   CARDIAC CATHETERIZATION  06/16/1997   CATARACT EXTRACTION W/ INTRAOCULAR LENS  IMPLANT, BILATERAL     COLONOSCOPY     COLONOSCOPY WITH PROPOFOL N/A 12/09/2021   Procedure: COLONOSCOPY WITH PROPOFOL;  Surgeon: Midge Minium, MD;  Location: [facility];  Service: Endoscopy;  Laterality: N/A;   ESOPHAGEAL DILATION N/A 02/11/2016   Procedure: ESOPHAGEAL DILATION;  Surgeon: Midge Minium, MD;  Location: [facility];  Service: Endoscopy;  Laterality: N/A;   ESOPHAGOGASTRODUODENOSCOPY (EGD) WITH PROPOFOL N/A 02/11/2016   Procedure: ESOPHAGOGASTRODUODENOSCOPY (EGD) WITH PROPOFOL;  Surgeon: Midge Minium, MD;  Location: [facility];  Service: Endoscopy;  Laterality: N/A;  DIABETIC    Allergies: No Known Allergies  Family History  Problem Relation Age of Onset   Stroke Father    Hypertension Father    Hypertension Mother    Stroke Mother    Breast cancer Cousin        2 mat cousins   Breast cancer Cousin        pat cousin    Social History:  reports  that she quit smoking about 26 years ago. Her smoking use included cigarettes. She has a 13.50 pack-year smoking history. She has never used smokeless tobacco. She reports current alcohol use of about 2.0 - 3.0 standard drinks of alcohol per week. She reports that she does not use drugs.  ROS: A complete review of systems was performed.  All systems are negative except for pertinent findings as noted.  Physical Exam:  Vital signs in last 24 hours: Temp:  [98.2 F (36.8 C)] 98.2 F (36.8 C) (05/03 0729) Pulse Rate:  [67] 67 (05/03 0729) Resp:  [15] 15 (05/03 0729) BP: (150)/(87) 150/87 (05/03 0729) SpO2:  [97 %] 97 % (05/03 0729) Constitutional:  Alert and oriented, No acute distress Cardiovascular: Regular rate and rhythm Respiratory: Normal respiratory effort, Lungs clear bilaterally GI: Abdomen is soft, nontender,  nondistended, no abdominal masses GU: No CVA tenderness Lymphatic: No lymphadenopathy Neurologic: Grossly intact, no focal deficits Psychiatric: Normal mood and affect  Laboratory Data:  No results for input(s): "WBC", "HGB", "HCT", "PLT" in the last 72 hours.  No results for input(s): "NA", "K", "CL", "GLUCOSE", "BUN", "CALCIUM", "CREATININE" in the last 72 hours.  Invalid input(s): "CO3"   No results found for this or any previous visit (from the past 24 hour(s)). No results found for this or any previous visit (from the past 240 hour(s)).  Renal Function: No results for input(s): "CREATININE" in the last 168 hours. Estimated Creatinine Clearance: 77 mL/min (by C-G formula based on SCr of 0.82 mg/dL).  Radiologic Imaging: No results found.  I independently reviewed the above imaging studies.  Assessment and Plan Zilda Bomberger is a 72 y.o. female with right renal stone here for cysto, R RPG, R URS/LL, R stent placement.  -The risks, benefits and alternatives of cystoscopy with R RPG, R URS/LL, R stent placement was discussed with the patient.  Risks  include, but are not limited to: bleeding, urinary tract infection, ureteral injury, ureteral stricture disease, chronic pain, urinary symptoms, bladder injury, stent migration, the need for nephrostomy tube placement, MI, CVA, DVT, PE and the inherent risks with general anesthesia.  The patient voices understanding and wishes to proceed.    Matt R. Dominiqua Cooner MD 10/17/2022, 7:33 AM  [facility] Pager: [phone]): [phone]

## 2022-10-17 NOTE — Discharge Instructions (Signed)
[facility] [phone] Post Ureteroscopy With or Without Stent Instructions  Definitions:  Ureter: The duct that transports urine from the kidney to the bladder. Stent:   A plastic hollow tube that is placed into the ureter, from the kidney to the bladder to prevent the ureter from swelling shut.  GENERAL INSTRUCTIONS:  Despite the fact that no skin incisions were used, the area around the ureter and bladder is raw and irritated. The stent is a foreign body which will further irritate the bladder wall. This irritation is manifested by increased frequency of urination, both day and night, and by an increase in the urge to urinate. In some, the urge to urinate is present almost always. Sometimes the urge is strong enough that you may not be able to stop yourself from urinating. The only real cure is to remove the stent and then give time for the bladder wall to heal which can't be done until the danger of the ureter swelling shut has passed, which varies.  You may see some blood in your urine while the stent is in place and a few days afterwards. Do not be alarmed, even if the urine was clear for a while. Get off your feet and drink lots of fluids until clearing occurs. If you start to pass clots or don't improve, call us.  DIET: You may return to your normal diet immediately. Because of the raw surface of your bladder, alcohol, spicy foods, acid type foods and drinks with caffeine may cause irritation or frequency and should be used in moderation. To keep your urine flowing freely and to avoid constipation, drink plenty of fluids during the day ( 8-10 glasses ). Tip: Avoid cranberry juice because it is very acidic.  ACTIVITY: Your physical activity doesn't need to be restricted. However, if you are very active, you may see some blood in your urine. We suggest that you reduce your activity under these circumstances until the bleeding has stopped.  BOWELS: It is important to  keep your bowels regular during the postoperative period. Straining with bowel movements can cause bleeding. A bowel movement every other day is reasonable. Use a mild laxative if needed, such as Milk of Magnesia 2-3 tablespoons, or 2 Dulcolax tablets. Call if you continue to have problems. If you have been taking narcotics for pain, before, during or after your surgery, you may be constipated. Take a laxative if necessary.   MEDICATION: You should resume your pre-surgery medications unless told not to. In addition you will often be given an antibiotic to prevent infection. These should be taken as prescribed until the bottles are finished unless you are having an unusual reaction to one of the drugs.  PROBLEMS YOU SHOULD REPORT TO [place]: Fevers over 100.5 Fahrenheit. Heavy bleeding, or clots ( See above notes about blood in urine ). Inability to urinate. Drug reactions ( hives, rash, nausea, vomiting, diarrhea ). Severe burning or pain with urination that is not improving.  FOLLOW-UP: You will need a follow-up appointment to monitor your progress. Call for this appointment at the number listed above. Usually the first appointment will be about three to fourteen days after your surgery.  You have a stent draining your kidney. You may remove stent by pulling on attached string on Monday AM.

## 2022-10-17 NOTE — Anesthesia Procedure Notes (Signed)
Procedure Name: LMA Insertion Date/Time: 10/17/2022 9:14 AM  Performed by: Johnette Abraham, CRNAPre-anesthesia Checklist: Patient identified, Emergency Drugs available, Suction available and Patient being monitored Patient Re-evaluated:Patient Re-evaluated prior to induction Oxygen Delivery Method: Circle System Utilized Preoxygenation: Pre-oxygenation with 100% oxygen Induction Type: IV induction Ventilation: Mask ventilation without difficulty LMA: LMA inserted LMA Size: 4.0 Number of attempts: 1 Airway Equipment and Method: Bite block Placement Confirmation: positive ETCO2 Tube secured with: Tape Dental Injury: Teeth and Oropharynx as per pre-operative assessment

## 2022-10-17 NOTE — OR Nursing (Signed)
Stone taken by Dr. Gay. ?

## 2022-10-17 NOTE — Anesthesia Postprocedure Evaluation (Signed)
Anesthesia Post Note  Patient: Sarah [name]  Procedure(s) Performed: CYSTOSCOPY/RIGHT RETROGRADE PYELOGRAM/RIGHT URETEROSCOPY/HOLMIUM LASER/RIGHT STENT PLACEMENT (Right)     Patient location during evaluation: PACU Anesthesia Type: General Level of consciousness: awake and alert Pain management: pain level controlled Vital Signs Assessment: post-procedure vital signs reviewed and stable Respiratory status: spontaneous breathing, nonlabored ventilation, respiratory function stable and patient connected to nasal cannula oxygen Cardiovascular status: blood pressure returned to baseline and stable Postop Assessment: no apparent nausea or vomiting Anesthetic complications: no   No notable events documented.  Last Vitals:  Vitals:   10/17/22 1130 10/17/22 1150  BP: (!) 147/92 (!) 149/98  Pulse: 64 64  Resp: 11   Temp:  (!) 36.4 C  SpO2: 99% 98%    Last Pain:  Vitals:   10/17/22 1150  TempSrc: Oral  PainSc: 4                  Varina Hulon

## 2022-10-17 NOTE — Anesthesia Preprocedure Evaluation (Addendum)
Anesthesia Evaluation  Patient identified by MRN, date of birth, ID band Patient awake    Reviewed: Allergy & Precautions, NPO status , Patient's Chart, lab work & pertinent test results, reviewed documented beta blocker date and time   History of Anesthesia Complications Negative for: history of anesthetic complications  Airway Mallampati: III  TM Distance: >3 FB Neck ROM: Limited    Dental  (+) Upper Dentures, Lower Dentures, Edentulous Upper, Edentulous Lower   Pulmonary COPD, former smoker   breath sounds clear to auscultation       Cardiovascular hypertension, Pt. on medications (-) angina + CAD, + Peripheral Vascular Disease, +CHF and + DOE   Rhythm:Regular Rate:Normal   HLD  TTE (03/2021): LVEF 45-50% Grade I diastolic dysfunction Mild MR   Neuro/Psych    GI/Hepatic ,neg GERD  ,,  Endo/Other  diabetes, Type 2    Renal/GU      Musculoskeletal  (+) Arthritis ,    Abdominal  (+) + obese (BMI 41)  Peds  Hematology   Anesthesia Other Findings   Reproductive/Obstetrics                             Anesthesia Physical Anesthesia Plan  ASA: 3  Anesthesia Plan: General   Post-op Pain Management: Minimal or no pain anticipated, Tylenol PO (pre-op)* and Celebrex PO (pre-op)*   Induction: Intravenous  PONV Risk Score and Plan: 3 and Treatment may vary due to age or medical condition and TIVA  Airway Management Planned: LMA and Oral ETT  Additional Equipment: None  Intra-op Plan:   Post-operative Plan: Extubation in OR  Informed Consent: I have reviewed the patients History and Physical, chart, labs and discussed the procedure including the risks, benefits and alternatives for the proposed anesthesia with the patient or authorized representative who has indicated his/her understanding and acceptance.       Plan Discussed with: CRNA and Anesthesiologist  Anesthesia Plan  Comments: (  )        Anesthesia Quick Evaluation

## 2022-10-17 NOTE — Transfer of Care (Signed)
Immediate Anesthesia Transfer of Care Note  Patient: Sarah [name]  Procedure(s) Performed: CYSTOSCOPY/RIGHT RETROGRADE PYELOGRAM/RIGHT URETEROSCOPY/HOLMIUM LASER/RIGHT STENT PLACEMENT (Right)  Patient Location: PACU  Anesthesia Type:General  Level of Consciousness: awake, alert , oriented, and patient cooperative  Airway & Oxygen Therapy: Patient Spontanous Breathing and Patient connected to face mask oxygen  Post-op Assessment: Report given to RN, Post -op Vital signs reviewed and stable, and Patient moving all extremities  Post vital signs: Reviewed and stable  Last Vitals:  Vitals Value Taken Time  BP 149/77 10/17/22 1001  Temp    Pulse 73 10/17/22 1002  Resp 23 10/17/22 1002  SpO2 97 % 10/17/22 1002  Vitals shown include unvalidated device data.  Last Pain:  Vitals:   10/17/22 0743  TempSrc:   PainSc: 2          Complications: No notable events documented.

## 2022-10-17 NOTE — Op Note (Signed)
Operative Note  Preoperative diagnosis:  1.  Right renal stone  Postoperative diagnosis: 1.  Right renal stone  Procedure(s): 1.  Cystoscopy 2. Right ureteroscopy with laser lithotripsy and basket extraction of stones 3. Right retrograde pyelogram 4. Right ureteral stent placement 5. Fluoroscopy with intraoperative interpretation  Surgeon: Jettie Pagan, MD  Assistants:  None  Anesthesia:  General  Complications:  None  EBL:  Minimal  Specimens: 1. Stones for stone analysis (to be done at [facility])  Drains/Catheters: 1.  Right 6Fr x 24cm ureteral stent with tether string  Intraoperative findings:   Cystoscopy demonstrated no suspicious bladder lesions Right ureteroscopy demonstrated 4 mm right upper pole stone, fragmented and basket extracted. Successful right stent placement.  Indication:  Sarah [name] is a 72 y.o. female with a 4mm right upper pole stone here for ureteroscopy with laser lithotripsy and basket extraction of stones.  Description of procedure: After informed consent was obtained from the patient, the patient was identified and taken to the operating room and placed in the supine position.  General anesthesia was administered as well as perioperative IV antibiotics.  At the beginning of the case, a time-out was performed to properly identify the patient, the surgery to be performed, and the surgical site.  Sequential compression devices were applied to the lower extremities at the beginning of the case for DVT prophylaxis.  The patient was then placed in the dorsal lithotomy supine position, prepped and draped in sterile fashion.  Preliminary scout fluoroscopy revealed that there was a 4mm calcification area at the right upper pole, which corresponds to the stone found on the preoperative CT scan. We then passed the 21-French rigid cystoscope through the urethra and into the bladder under vision without any difficulty.  A systematic evaluation of the  bladder revealed no evidence of any suspicious bladder lesions.  Ureteral orifices were in normal position.    Under cystoscopic and flouroscopic guidance, we cannulated the right ureteral orifice with a 5-French open-ended ureteral catheter and a gentle retrograde pyelogram was performed, revealing a normal caliber ureter without any filling defects. There was no hydronephrosis of the collecting system. A 0.038 sensor wire was then passed up to the level of the renal pelvis and secured to the drape as a safety wire. The ureteral catheter and cystoscope were removed, leaving the safety wire in place.   A second 0.038 sensor wire was passed under direct vision and the semirigid scope was removed. The single lumen flexible ureteroscope was advanced into the collecting system via the access sheath. The collecting system was inspected. The calculus was identified at the right upper pole. Using the 272 micron holmium laser fiber, the stone was fragmented into 3 pieces. A 2.2 Fr zero tip basket was used to remove the fragments under visual guidance. These were sent for chemical analysis. With the ureteroscope in the kidney, a gentle pyelogram was performed to delineate the calyceal system and we evaluated the calyces systematically. We encountered no further stones. The rest of the stone fragments were very tiny and these were  irrigated away gently. The calyces were re-inspected and there were no significant stone fragment residual.   We then withdrew the ureteroscope back down the ureter noting no evidence of any stones along the course of the ureter.  Prior to removing the ureteroscope, we did pass the Glidewire back up to the ureter to the renal pelvis.  Once the ureteroscope was removed, we then used the Glidewire under fluoroscopic guidance and passed  up a 6-French x 24 cm double-pigtail ureteral stent up the ureter, making sure that the proximal and distal ends coiled within the kidney and bladder  respectively.  Note that we left a long tether string attached to the distal end of the ureteral stent and it exited the urethral meatus and was tucked into her vagina. The cystoscope was then advanced back into the bladder under vision.  We were able to see the distal stent coiling nicely within the bladder.  The bladder was then emptied with irrigation solution.  The cystoscope was then removed.    The patient tolerated the procedure well and there was no complication. Patient was awoken from anesthesia and taken to the recovery room in stable condition. I was present and scrubbed for the entirety of the case.  Matt R. Keyairra Kolinski MD [facility]  Pager: [phone]

## 2022-10-18 ENCOUNTER — Encounter (HOSPITAL_COMMUNITY): Payer: Self-pay | Admitting: Urology

## 2022-10-21 DIAGNOSIS — N200 Calculus of kidney: Secondary | ICD-10-CM | POA: Diagnosis not present

## 2022-10-28 DIAGNOSIS — N200 Calculus of kidney: Secondary | ICD-10-CM | POA: Diagnosis not present

## 2022-11-12 DIAGNOSIS — R7309 Other abnormal glucose: Secondary | ICD-10-CM | POA: Diagnosis not present

## 2022-11-12 DIAGNOSIS — I10 Essential (primary) hypertension: Secondary | ICD-10-CM | POA: Diagnosis not present

## 2022-11-12 DIAGNOSIS — E7800 Pure hypercholesterolemia, unspecified: Secondary | ICD-10-CM | POA: Diagnosis not present

## 2022-11-12 DIAGNOSIS — I2510 Atherosclerotic heart disease of native coronary artery without angina pectoris: Secondary | ICD-10-CM | POA: Diagnosis not present

## 2022-11-18 DIAGNOSIS — E7800 Pure hypercholesterolemia, unspecified: Secondary | ICD-10-CM | POA: Diagnosis not present

## 2022-11-18 DIAGNOSIS — E559 Vitamin D deficiency, unspecified: Secondary | ICD-10-CM | POA: Diagnosis not present

## 2022-11-18 DIAGNOSIS — Z87442 Personal history of urinary calculi: Secondary | ICD-10-CM | POA: Diagnosis not present

## 2022-11-18 DIAGNOSIS — M1710 Unilateral primary osteoarthritis, unspecified knee: Secondary | ICD-10-CM | POA: Diagnosis not present

## 2022-11-18 DIAGNOSIS — I5032 Chronic diastolic (congestive) heart failure: Secondary | ICD-10-CM | POA: Diagnosis not present

## 2022-11-18 DIAGNOSIS — I2510 Atherosclerotic heart disease of native coronary artery without angina pectoris: Secondary | ICD-10-CM | POA: Diagnosis not present

## 2022-11-18 DIAGNOSIS — M47816 Spondylosis without myelopathy or radiculopathy, lumbar region: Secondary | ICD-10-CM | POA: Diagnosis not present

## 2022-11-18 DIAGNOSIS — R3915 Urgency of urination: Secondary | ICD-10-CM | POA: Diagnosis not present

## 2022-11-18 DIAGNOSIS — N200 Calculus of kidney: Secondary | ICD-10-CM | POA: Diagnosis not present

## 2022-11-18 DIAGNOSIS — I10 Essential (primary) hypertension: Secondary | ICD-10-CM | POA: Diagnosis not present

## 2022-11-18 DIAGNOSIS — R7309 Other abnormal glucose: Secondary | ICD-10-CM | POA: Diagnosis not present

## 2023-01-07 ENCOUNTER — Other Ambulatory Visit: Payer: Self-pay | Admitting: Pulmonary Disease

## 2023-01-09 DIAGNOSIS — I110 Hypertensive heart disease with heart failure: Secondary | ICD-10-CM | POA: Diagnosis not present

## 2023-01-09 DIAGNOSIS — J449 Chronic obstructive pulmonary disease, unspecified: Secondary | ICD-10-CM | POA: Diagnosis not present

## 2023-01-09 DIAGNOSIS — E119 Type 2 diabetes mellitus without complications: Secondary | ICD-10-CM | POA: Diagnosis not present

## 2023-01-09 DIAGNOSIS — I5020 Unspecified systolic (congestive) heart failure: Secondary | ICD-10-CM | POA: Diagnosis not present

## 2023-01-21 ENCOUNTER — Telehealth: Payer: Self-pay | Admitting: Pulmonary Disease

## 2023-01-21 MED ORDER — SPIRIVA RESPIMAT 2.5 MCG/ACT IN AERS: 2.0000 | INHALATION_SPRAY | Freq: Every day | RESPIRATORY_TRACT | 0 refills | Status: DC

## 2023-01-21 NOTE — Telephone Encounter (Signed)
I spoke with the patient. I have scheduled her an appt for 10/3 at 2:30pm and placed her samples at the front desk for her to pick up.  Nothing further needed.

## 2023-01-26 ENCOUNTER — Other Ambulatory Visit: Payer: Self-pay | Admitting: Family Medicine

## 2023-01-26 DIAGNOSIS — Z1231 Encounter for screening mammogram for malignant neoplasm of breast: Secondary | ICD-10-CM

## 2023-01-28 DIAGNOSIS — U071 COVID-19: Secondary | ICD-10-CM | POA: Diagnosis not present

## 2023-02-03 DIAGNOSIS — U071 COVID-19: Secondary | ICD-10-CM | POA: Diagnosis not present

## 2023-02-19 ENCOUNTER — Ambulatory Visit
Admission: RE | Admit: 2023-02-19 | Discharge: 2023-02-19 | Disposition: A | Discharge status: Home / Self Care | Payer: PPO | Source: Ambulatory Visit | Attending: Family Medicine | Admitting: Family Medicine

## 2023-02-19 DIAGNOSIS — I10 Essential (primary) hypertension: Secondary | ICD-10-CM | POA: Diagnosis not present

## 2023-02-19 DIAGNOSIS — Z1231 Encounter for screening mammogram for malignant neoplasm of breast: Secondary | ICD-10-CM | POA: Insufficient documentation

## 2023-02-19 DIAGNOSIS — H26493 Other secondary cataract, bilateral: Secondary | ICD-10-CM | POA: Diagnosis not present

## 2023-02-19 DIAGNOSIS — H524 Presbyopia: Secondary | ICD-10-CM | POA: Diagnosis not present

## 2023-02-19 DIAGNOSIS — E119 Type 2 diabetes mellitus without complications: Secondary | ICD-10-CM | POA: Diagnosis not present

## 2023-02-19 DIAGNOSIS — Q141 Congenital malformation of retina: Secondary | ICD-10-CM | POA: Diagnosis not present

## 2023-02-19 DIAGNOSIS — H35033 Hypertensive retinopathy, bilateral: Secondary | ICD-10-CM | POA: Diagnosis not present

## 2023-02-20 DIAGNOSIS — Z13220 Encounter for screening for lipoid disorders: Secondary | ICD-10-CM | POA: Diagnosis not present

## 2023-02-20 DIAGNOSIS — R946 Abnormal results of thyroid function studies: Secondary | ICD-10-CM | POA: Diagnosis not present

## 2023-02-20 DIAGNOSIS — Z79899 Other long term (current) drug therapy: Secondary | ICD-10-CM | POA: Diagnosis not present

## 2023-02-20 DIAGNOSIS — R7309 Other abnormal glucose: Secondary | ICD-10-CM | POA: Diagnosis not present

## 2023-02-27 DIAGNOSIS — E559 Vitamin D deficiency, unspecified: Secondary | ICD-10-CM | POA: Diagnosis not present

## 2023-02-27 DIAGNOSIS — I10 Essential (primary) hypertension: Secondary | ICD-10-CM | POA: Diagnosis not present

## 2023-02-27 DIAGNOSIS — M1710 Unilateral primary osteoarthritis, unspecified knee: Secondary | ICD-10-CM | POA: Diagnosis not present

## 2023-02-27 DIAGNOSIS — M47816 Spondylosis without myelopathy or radiculopathy, lumbar region: Secondary | ICD-10-CM | POA: Diagnosis not present

## 2023-02-27 DIAGNOSIS — E7800 Pure hypercholesterolemia, unspecified: Secondary | ICD-10-CM | POA: Diagnosis not present

## 2023-02-27 DIAGNOSIS — I5032 Chronic diastolic (congestive) heart failure: Secondary | ICD-10-CM | POA: Diagnosis not present

## 2023-02-27 DIAGNOSIS — I2510 Atherosclerotic heart disease of native coronary artery without angina pectoris: Secondary | ICD-10-CM | POA: Diagnosis not present

## 2023-02-27 DIAGNOSIS — M549 Dorsalgia, unspecified: Secondary | ICD-10-CM | POA: Diagnosis not present

## 2023-02-27 DIAGNOSIS — R7309 Other abnormal glucose: Secondary | ICD-10-CM | POA: Diagnosis not present

## 2023-02-27 DIAGNOSIS — Z0000 Encounter for general adult medical examination without abnormal findings: Secondary | ICD-10-CM | POA: Diagnosis not present

## 2023-02-27 DIAGNOSIS — M5410 Radiculopathy, site unspecified: Secondary | ICD-10-CM | POA: Diagnosis not present

## 2023-03-04 ENCOUNTER — Other Ambulatory Visit: Payer: Self-pay | Admitting: Family

## 2023-03-04 DIAGNOSIS — I509 Heart failure, unspecified: Secondary | ICD-10-CM | POA: Diagnosis not present

## 2023-03-04 DIAGNOSIS — Z87891 Personal history of nicotine dependence: Secondary | ICD-10-CM | POA: Diagnosis not present

## 2023-03-04 DIAGNOSIS — R072 Precordial pain: Secondary | ICD-10-CM | POA: Diagnosis not present

## 2023-03-04 DIAGNOSIS — R079 Chest pain, unspecified: Secondary | ICD-10-CM | POA: Diagnosis not present

## 2023-03-05 DIAGNOSIS — R079 Chest pain, unspecified: Secondary | ICD-10-CM | POA: Diagnosis not present

## 2023-03-09 DIAGNOSIS — I5032 Chronic diastolic (congestive) heart failure: Secondary | ICD-10-CM | POA: Diagnosis not present

## 2023-03-09 DIAGNOSIS — I5022 Chronic systolic (congestive) heart failure: Secondary | ICD-10-CM | POA: Diagnosis not present

## 2023-03-09 DIAGNOSIS — I2510 Atherosclerotic heart disease of native coronary artery without angina pectoris: Secondary | ICD-10-CM | POA: Diagnosis not present

## 2023-03-09 DIAGNOSIS — I2584 Coronary atherosclerosis due to calcified coronary lesion: Secondary | ICD-10-CM | POA: Diagnosis not present

## 2023-03-09 DIAGNOSIS — I10 Essential (primary) hypertension: Secondary | ICD-10-CM | POA: Diagnosis not present

## 2023-03-09 DIAGNOSIS — E782 Mixed hyperlipidemia: Secondary | ICD-10-CM | POA: Diagnosis not present

## 2023-03-09 DIAGNOSIS — R0789 Other chest pain: Secondary | ICD-10-CM | POA: Diagnosis not present

## 2023-03-19 ENCOUNTER — Ambulatory Visit: Payer: PPO | Admitting: Pulmonary Disease

## 2023-03-19 ENCOUNTER — Encounter: Payer: Self-pay | Admitting: Pulmonary Disease

## 2023-03-19 VITALS — BP 118/78 | HR 65 | Temp 97.7°F | Ht 63.0 in | Wt 248.6 lb

## 2023-03-19 DIAGNOSIS — I5042 Chronic combined systolic (congestive) and diastolic (congestive) heart failure: Secondary | ICD-10-CM | POA: Diagnosis not present

## 2023-03-19 DIAGNOSIS — Z6841 Body Mass Index (BMI) 40.0 and over, adult: Secondary | ICD-10-CM | POA: Diagnosis not present

## 2023-03-19 DIAGNOSIS — R0683 Snoring: Secondary | ICD-10-CM | POA: Diagnosis not present

## 2023-03-19 DIAGNOSIS — J449 Chronic obstructive pulmonary disease, unspecified: Secondary | ICD-10-CM

## 2023-03-19 DIAGNOSIS — J3089 Other allergic rhinitis: Secondary | ICD-10-CM

## 2023-03-19 MED ORDER — STIOLTO RESPIMAT 2.5-2.5 MCG/ACT IN AERS: 2.0000 | INHALATION_SPRAY | Freq: Every day | RESPIRATORY_TRACT | 0 refills | Status: DC

## 2023-03-19 NOTE — Patient Instructions (Addendum)
Recommend taking some Zyrtec at bedtime to see if this helps with calming down your cough little bit.  We have ordered a home sleep test.  We will call you with the results as soon as these are known.  We are ordering a breathing test.  I am switching your medication a little bit.  It is Spiriva plus another ingredient that hopefully can help you with them mucus in your chest.  The medication is called Stiolto.  It is still 2 puffs once a day just like the Spiriva.  Let us know how you do with this medication so we can send in the prescription for you.  We will see you in follow-up in 2 to 3 months time, sooner should any new problems arise.

## 2023-03-19 NOTE — Progress Notes (Signed)
Subjective:    Patient ID: Sarah [name], female    DOB: 1950-09-29, 72 y.o.   MRN: [id number]  Patient Care Team: Irena Reichmann, DO as PCP - General (Family Medicine) Sherrie Mustache, MD as Consulting Physician (Internal Medicine) Lemar Livings Merrily Pew, MD (General Surgery) Salena Saner, MD as Consulting Physician (Pulmonary Disease)  Chief Complaint  Patient presents with   Follow-up    Had Covid in Aug. Dry cough. No wheezing. DOE.    HPI 72 year old female, former smoker (13.5 PY), who follows here for the issue of moderate COPD and dyspnea on exertion.  This is a scheduled visit.  Patient was last seen by me on 17 September 2022.  Continues to be on Spiriva 2.5 mcg, 2 inhalations daily. She has not had any COPD exacerbations nor hospital admissions since her last visit.  She also has combined systolic and diastolic heart failure and follows up with cardiology in this regard.  No decompensation of this issue.    Since her prior visit she she has had an episode of COVID-19 in August, did not require hospitalization.  Has had a lingering dry cough after COVID-19 which is now improving.  Since that infection however she has noted more breathlessness than usual on exertion, no chest pain, no lower extremity edema.  Since her COVID she has not had any fevers, chills or sweats. She does note that she has had some weight gain and she is working on taking weight off.  She notes that her breathing is much better when her weight is better controlled.  She does not endorse any orthopnea or paroxysmal nocturnal dyspnea.  No lower extremity edema or calf tenderness.  She has been told by her husband that she has loud snoring, she notes nonrestorative sleep.  Does not feel rested upon awakening in the morning.  Since her COVID episode she has had to use albuterol twice a day.  She does not endorse any other symptomatology.  DATA: 04/29/2016 PFTs: FEV1 1.59 L or 82% predicted, FVC 2.39 L or 98% predicted,  FEV1/FVC 67%, diffusion capacity 70%.  Consistent with mild obstruction, concomitant restriction likely due to obesity. 03/17/2019 CT chest: No parenchymal abnormalities, specifically no lung nodule.  No emphysema. 04/08/2021 chest x-ray PA and lateral: Diffuse right lung airspace disease consistent with multifocal pneumonia. 04/09/2021 echocardiogram: LVEF 45 to 50% with mild decreased function in the LV.  No wall motion abnormalities.  Diastolic dysfunction grade 1.  Mild mitral regurgitation. 05/08/2021 chest x-ray PA lateral: No acute cardiopulmonary disease, pneumonia resolved. 06/25/2021 PFTs: FEV1 1.78 L or 100% predicted, FVC 2.37 L or 103% of predicted.  FEV1/FVC 75% lung volumes normal, no bronchodilator response diffusion capacity 82%, low end of normal.  Compared to PFTs performed November 2017 there has been improvement. 10/09/2022 echocardiogram: LVEF 60 to 65%, grade 1 diastolic dysfunction, normal right ventricular function.  Mild mitral valve regurgitation, no aortic stenosis present.   Review of Systems A 10 point review of systems was performed and it is as noted above otherwise negative.   Patient Active Problem List   Diagnosis Date Noted   Colon cancer screening    Impaired fasting glucose    Acute pain of right shoulder    Acute on chronic combined systolic and diastolic CHF (congestive heart failure) (HCC) 04/08/2021   Diabetes mellitus without complication (HCC)    COPD (chronic obstructive pulmonary disease) (HCC)    HLD (hyperlipidemia)    CAD (coronary artery disease)    Chest  pain    Osteoarthritis of knee 07/09/2017   Patellofemoral stress syndrome 07/09/2017   Venous insufficiency of both lower extremities 02/11/2017   Chronic diastolic CHF (congestive heart failure), NYHA class 2 (HCC) 05/12/2016   Controlled type 2 diabetes mellitus without complication, without long-term current use of insulin (HCC) 05/12/2016   Essential hypertension 04/04/2016   Mixed  hyperlipidemia 04/04/2016   Other diseases of stomach and duodenum    Problems with swallowing and mastication    Left buttock abscess 12/15/2014   DOE (dyspnea on exertion) 06/05/2014   COPD type A (HCC) 06/05/2014   Morbid obesity with BMI of 40.0-44.9, adult (HCC) 06/05/2014    Social History   Tobacco Use   Smoking status: Former    Current packs/day: 0.00    Average packs/day: 0.5 packs/day for 27.0 years (13.5 ttl pk-yrs)    Types: Cigarettes    Start date: 01/14/1969    Quit date: 01/15/1996    Years since quitting: 27.1   Smokeless tobacco: Never  Substance Use Topics   Alcohol use: Yes    Alcohol/week: 2.0 - 3.0 standard drinks of alcohol    Types: 1 - 2 Glasses of wine, 1 Cans of beer per week    Comment: WINE WEEKLY    No Known Allergies  Current Meds  Medication Sig   acetaminophen (TYLENOL) 500 MG tablet Take 500 mg by mouth every 6 (six) hours as needed for moderate pain.   albuterol (VENTOLIN HFA) 108 (90 Base) MCG/ACT inhaler Inhale 1-2 puffs into the lungs every 6 (six) hours as needed for wheezing or shortness of breath.   ascorbic acid (VITAMIN C) 1000 MG tablet Take 1,000 mg by mouth daily.   aspirin 81 MG tablet Take 81 mg by mouth daily.   carvedilol (COREG) 12.5 MG tablet Take 12.5 mg by mouth 2 (two) times daily with a meal.   Cholecalciferol (VITAMIN D) 50 MCG (2000 UT) CAPS Take 2,000 Units by mouth daily.   cyclobenzaprine (FLEXERIL) 5 MG tablet Take 5 mg by mouth at bedtime as needed.   furosemide (LASIX) 40 MG tablet Take 20 mg by mouth daily.   isosorbide mononitrate (IMDUR) 30 MG 24 hr tablet Take 30 mg by mouth daily.   JARDIANCE 10 MG TABS tablet TAKE 1 TABLET BY MOUTH EVERY DAY BEFORE BREAKFAST   Menthol-Methyl Salicylate (SALONPAS PAIN RELIEF PATCH) PTCH Place 1 patch onto the skin daily as needed (pain).   Multiple Vitamins-Minerals (CENTRUM SILVER PO) Take 1 tablet by mouth daily.   simvastatin (ZOCOR) 40 MG tablet Take 40 mg by mouth at  bedtime.   spironolactone (ALDACTONE) 25 MG tablet Take 0.5 tablets (12.5 mg total) by mouth daily.   Tiotropium Bromide Monohydrate (SPIRIVA RESPIMAT) 2.5 MCG/ACT AERS INHALE 2 PUFFS BY MOUTH INTO THE LUNGS DAILY   Tiotropium Bromide Monohydrate (SPIRIVA RESPIMAT) 2.5 MCG/ACT AERS Inhale 2 puffs into the lungs daily.   tiZANidine (ZANAFLEX) 2 MG tablet Take 2 mg by mouth at bedtime.   valsartan (DIOVAN) 80 MG tablet Take 80 mg by mouth daily.    Immunization History  Administered Date(s) Administered   Fluad Quad(high Dose 65+) 05/08/2021, 04/11/2022   Influenza, High Dose Seasonal PF 04/09/2018   Influenza, Quadrivalent, Recombinant, Inj, Pf 04/18/2019, 04/30/2020   Influenza-Unspecified 03/02/2014, 04/18/2019   Moderna Sars-Covid-2 Vaccination 07/07/2019, 07/29/2019, 08/29/2019   PNEUMOCOCCAL CONJUGATE-20 01/30/2021   PPD Test 08/19/2019, 09/21/2020, 09/09/2021, 09/19/2022   Pneumococcal Polysaccharide-23 05/17/2015        Objective:  BP 118/78 (BP Location: Right Arm, Cuff Size: Normal)   Pulse 65   Temp 97.7 F (36.5 C)   Ht 5\' 3"  (1.6 m)   Wt 248 lb 9.6 oz (112.8 kg)   SpO2 98%   BMI 44.04 kg/m   SpO2: 98 % O2 Device: None (Room air)  GENERAL: Obese woman, no acute distress, fully ambulatory.  No conversational dyspnea. HEAD: Normocephalic, atraumatic.  EYES: Pupils equal, round, reactive to light.  No scleral icterus.  MOUTH: Dentition is full.  Oral mucosa moist.  No thrush. NECK: Supple. No thyromegaly. Trachea midline. No JVD.  No adenopathy. PULMONARY: Good air entry bilaterally.  No adventitious sounds. CARDIOVASCULAR: S1 and S2. Regular rate and rhythm.  No rubs, murmurs or gallops heard. ABDOMEN: Obese otherwise benign. MUSCULOSKELETAL: No joint deformity, no clubbing, no edema.  NEUROLOGIC: No overt focal deficit, no gait disturbance, speech is fluent. SKIN: Intact,warm,dry. PSYCH: Mood and behavior normal.     03/19/2023    3:00 PM 05/05/2016     9:00 AM  Results of the Epworth flowsheet  Sitting and reading 1 1  Watching TV 3 3  Sitting, inactive in a public place (e.g. a theatre or a meeting) 1 0  As a passenger in a car for an hour without a break 1 1  Lying down to rest in the afternoon when circumstances permit 3 3  Sitting and talking to someone 1 0  Sitting quietly after a lunch without alcohol 2 0  In a car, while stopped for a few minutes in traffic 0 0  Total score 12 8     Assessment & Plan:     ICD-10-CM   1. Stage 2 moderate COPD by GOLD classification (HCC)  J44.9 Pulmonary Function Test [facility] Only   Reassess with PFTs Will step up therapy to Stiolto Continue as needed albuterol    2. Chronic combined systolic and diastolic CHF, NYHA class 2 (HCC)  I50.42    This issue adds complexity to her management LVEF has normalized Follows with heart failure clinic    3. Perennial allergic rhinitis  J30.89    Recommend Zyrtec at bedtime    4. Loud snoring  R06.83 Home sleep test   Epworth score 12 Snoring/nonrestorative sleep Will obtain home sleep study    5. Morbid obesity with BMI of 40.0-44.9, adult (HCC)  E66.01    Z68.41    Patient would benefit from weight loss     Orders Placed This Encounter  Procedures   Pulmonary Function Test [facility] Only    Standing Status:   Future    Standing Expiration Date:   03/18/2024    Order Specific Question:   Full PFT: includes the following: basic spirometry, spirometry pre & post bronchodilator, diffusion capacity (DLCO), lung volumes    Answer:   Full PFT    Order Specific Question:   This test can only be performed at    Answer:   [facility] sleep test    Standing Status:   Future    Standing Expiration Date:   03/18/2024    Order Specific Question:   Where should this test be performed:    Answer:   [facility] - Pulmonary   Meds ordered this encounter  Medications   Tiotropium Bromide-Olodaterol (STIOLTO RESPIMAT) 2.5-2.5 MCG/ACT AERS    Sig: Inhale 2  puffs into the lungs daily.    Dispense:  8 g    Refill:  0  Order Specific Question:   Lot Number?    Answer:   [id number]    Order Specific Question:   Expiration Date?    Answer:   10/14/2024    Order Specific Question:   Quantity    Answer:   2   We will see the patient in follow-up in 2 to 3 months time call sooner should any new problems arise.  She is to let us know if Stiolto helps her more than Spiriva.  We will notify her of the results of sleep and pulmonary function studies once these are available.   Gailen Shelter, MD [facility]    *This note was dictated using voice recognition software/Dragon.  Despite best efforts to proofread, errors can occur which can change the meaning. Any transcriptional errors that result from this process are unintentional and may not be fully corrected at the time of dictation.

## 2023-03-30 DIAGNOSIS — I2584 Coronary atherosclerosis due to calcified coronary lesion: Secondary | ICD-10-CM | POA: Diagnosis not present

## 2023-03-30 DIAGNOSIS — R0789 Other chest pain: Secondary | ICD-10-CM | POA: Diagnosis not present

## 2023-03-30 DIAGNOSIS — I5032 Chronic diastolic (congestive) heart failure: Secondary | ICD-10-CM | POA: Diagnosis not present

## 2023-03-30 DIAGNOSIS — I2510 Atherosclerotic heart disease of native coronary artery without angina pectoris: Secondary | ICD-10-CM | POA: Diagnosis not present

## 2023-04-06 DIAGNOSIS — I10 Essential (primary) hypertension: Secondary | ICD-10-CM | POA: Diagnosis not present

## 2023-04-06 DIAGNOSIS — I5032 Chronic diastolic (congestive) heart failure: Secondary | ICD-10-CM | POA: Diagnosis not present

## 2023-04-06 DIAGNOSIS — R0789 Other chest pain: Secondary | ICD-10-CM | POA: Diagnosis not present

## 2023-04-06 DIAGNOSIS — E782 Mixed hyperlipidemia: Secondary | ICD-10-CM | POA: Diagnosis not present

## 2023-04-06 DIAGNOSIS — I2510 Atherosclerotic heart disease of native coronary artery without angina pectoris: Secondary | ICD-10-CM | POA: Diagnosis not present

## 2023-04-07 ENCOUNTER — Encounter (INDEPENDENT_AMBULATORY_CARE_PROVIDER_SITE_OTHER): Payer: PRIVATE HEALTH INSURANCE | Admitting: Adult Health

## 2023-04-07 DIAGNOSIS — R0683 Snoring: Secondary | ICD-10-CM

## 2023-04-07 DIAGNOSIS — G4730 Sleep apnea, unspecified: Secondary | ICD-10-CM | POA: Diagnosis not present

## 2023-04-09 ENCOUNTER — Encounter: Payer: Self-pay | Admitting: Pulmonary Disease

## 2023-04-16 ENCOUNTER — Ambulatory Visit: Payer: PPO

## 2023-04-16 DIAGNOSIS — Z23 Encounter for immunization: Secondary | ICD-10-CM | POA: Diagnosis not present

## 2023-04-16 DIAGNOSIS — Z719 Counseling, unspecified: Secondary | ICD-10-CM

## 2023-04-16 NOTE — Progress Notes (Signed)
In nurse clinic for immunizations. Patient requesting Flu High-Dose vaccine. Voices no concerns. VIS reviewed and given to patient. Vaccine tolerated well; no issues noted. [facility] updated and copy given to patient.   Abagail Kitchens, RN

## 2023-04-17 ENCOUNTER — Ambulatory Visit: Payer: PPO

## 2023-04-28 DIAGNOSIS — R0683 Snoring: Secondary | ICD-10-CM

## 2023-05-07 NOTE — Progress Notes (Deleted)
I have notified the patient. She will work on weight loss and not sleeping on her back.  Nothing further needed.

## 2023-05-11 ENCOUNTER — Telehealth: Payer: Self-pay | Admitting: Family

## 2023-05-11 ENCOUNTER — Encounter: Payer: PRIVATE HEALTH INSURANCE | Admitting: Family

## 2023-05-11 NOTE — Progress Notes (Deleted)
PCP: Irena Reichmann, DO (last seen 06/24) Primary Cardiologist: Thurston Hole, MD  (last seen 10/24)   HPI:   Sarah [name] is a 72 y/o female with a history of DM, HTN, COPD, previous tobacco use and chronic heart failure.   Was in the ED 03/04/23 due to chest pain. EKG and troponins were negative.   Echo 03/28/14: EF 65-70% with Grade I DD, mild LAE Echo 08/28/14: EF 60-65% with mild MR, mild LAE, PA pressure of 33 mmHg Echo 01/04/16: EF 55-60% with grade I DD, mild/ moderate LAE, PA Pressure of 35 mmHg Echo 05/23/19: EF 55-60% with Grade I DD, mild LAE Echo 04/09/21: EF of 45-50% along with mild MR.  Echo 10/09/22: EF 60-65% along with mild MR.  Echo 03/30/23: EF >55%   She presents today for a HF follow-up visit with a chief complaint of     ROS: All systems negative except as listed in HPI, PMH and Problem List.  SH:  Social History   Socioeconomic History   Marital status: Married    Spouse name: Not on file   Number of children: 3   Years of education: 12   Highest education level: Not on file  Occupational History   Occupation: Human resources officer  Tobacco Use   Smoking status: Former    Current packs/day: 0.00    Average packs/day: 0.5 packs/day for 27.0 years (13.5 ttl pk-yrs)    Types: Cigarettes    Start date: 01/14/1969    Quit date: 01/15/1996    Years since quitting: 27.3   Smokeless tobacco: Never  Vaping Use   Vaping status: Never Used  Substance and Sexual Activity   Alcohol use: Yes    Alcohol/week: 2.0 - 3.0 standard drinks of alcohol    Types: 1 - 2 Glasses of wine, 1 Cans of beer per week    Comment: WINE WEEKLY   Drug use: No   Sexual activity: Not on file  Other Topics Concern   Not on file  Social History Narrative   Not on file   Social Determinants of Health   Financial Resource Strain: Not on file  Food Insecurity: Not on file  Transportation Needs: Not on file  Physical Activity: Not on file  Stress: Not on file  Social  Connections: Not on file  Intimate Partner Violence: Not on file    FH:  Family History  Problem Relation Age of Onset   Stroke Father    Hypertension Father    Hypertension Mother    Stroke Mother    Breast cancer Cousin        2 mat cousins   Breast cancer Cousin        pat cousin    Past Medical History:  Diagnosis Date   Arthritis    Carotid artery disease (HCC)    Congestive heart failure (HCC)    COPD (chronic obstructive pulmonary disease) (HCC)    Diabetes mellitus without complication (HCC)    pt denies, however is on metformin daily   Dyspnea    HBP (high blood pressure)    Heart murmur    History of kidney stones     Current Outpatient Medications  Medication Sig Dispense Refill   acetaminophen (TYLENOL) 500 MG tablet Take 500 mg by mouth every 6 (six) hours as needed for moderate pain.     albuterol (VENTOLIN HFA) 108 (90 Base) MCG/ACT inhaler Inhale 1-2 puffs into the lungs every 6 (six) hours as needed for  wheezing or shortness of breath.     ascorbic acid (VITAMIN C) 1000 MG tablet Take 1,000 mg by mouth daily.     aspirin 81 MG tablet Take 81 mg by mouth daily.     carvedilol (COREG) 12.5 MG tablet Take 12.5 mg by mouth 2 (two) times daily with a meal.     Cholecalciferol (VITAMIN D) 50 MCG (2000 UT) CAPS Take 2,000 Units by mouth daily.     cyclobenzaprine (FLEXERIL) 5 MG tablet Take 5 mg by mouth at bedtime as needed.     docusate sodium (COLACE) 100 MG capsule Take 1 capsule (100 mg total) by mouth daily as needed for up to 30 doses. (Patient not taking: Reported on 03/19/2023) 30 capsule 0   furosemide (LASIX) 40 MG tablet Take 20 mg by mouth daily.     hydroxypropyl methylcellulose / hypromellose (ISOPTO TEARS / GONIOVISC) 2.5 % ophthalmic solution Place 1 drop into both eyes as needed for dry eyes. (Patient not taking: Reported on 03/19/2023)     isosorbide mononitrate (IMDUR) 30 MG 24 hr tablet Take 30 mg by mouth daily.  1   JARDIANCE 10 MG TABS  tablet TAKE 1 TABLET BY MOUTH EVERY DAY BEFORE BREAKFAST 30 tablet 5   Menthol-Methyl Salicylate (SALONPAS PAIN RELIEF PATCH) PTCH Place 1 patch onto the skin daily as needed (pain).     Multiple Vitamins-Minerals (CENTRUM SILVER PO) Take 1 tablet by mouth daily.     oxyCODONE-acetaminophen (PERCOCET) 5-325 MG tablet Take 1 tablet by mouth every 4 (four) hours as needed for up to 12 doses for severe pain. (Patient not taking: Reported on 03/19/2023) 12 tablet 0   simvastatin (ZOCOR) 40 MG tablet Take 40 mg by mouth at bedtime.     spironolactone (ALDACTONE) 25 MG tablet Take 0.5 tablets (12.5 mg total) by mouth daily. 30 tablet 0   Tiotropium Bromide-Olodaterol (STIOLTO RESPIMAT) 2.5-2.5 MCG/ACT AERS Inhale 2 puffs into the lungs daily. 8 g 0   tiZANidine (ZANAFLEX) 2 MG tablet Take 2 mg by mouth at bedtime.     valsartan (DIOVAN) 80 MG tablet Take 80 mg by mouth daily.     No current facility-administered medications for this visit.      PHYSICAL EXAM:  General:  Well appearing. No resp difficulty HEENT: normal Neck: supple. JVP flat. Carotids 2+ bilaterally; no bruits. No lymphadenopathy or thryomegaly appreciated. Cor: PMI normal. Regular rate & rhythm. No rubs, gallops or murmurs. Lungs: clear Abdomen: soft, nontender, nondistended. No hepatosplenomegaly. No bruits or masses. Good bowel sounds. Extremities: no cyanosis, clubbing, rash, edema Neuro: alert & orientedx3, cranial nerves grossly intact. Moves all 4 extremities w/o difficulty. Affect pleasant.   ECG:   ASSESSMENT & PLAN:  1: Chronic heart failure with now preserved ejection fraction- - NYHA class II - euvolemic today - weighing daily; reminded to call for an overnight weight gain of > 2 pounds or a weekly weight gain of > 5 pounds - weight 247.6 from last visit here 7 months ago - Echo 03/28/14: EF 65-70% with Grade I DD, mild LAE - Echo 08/28/14: EF 60-65% with mild MR, mild LAE, PA pressure of 33 mmHg - Echo  01/04/16: EF 55-60% with grade I DD, mild/ moderate LAE, PA Pressure of 35 mmHg - Echo 05/23/19: EF 55-60% with Grade I DD, mild LAE - Echo 04/09/21: EF of 45-50% along with mild MR.  - Echo 10/09/22: EF 60-65% along with mild MR.  - Echo 03/30/23: EF >55%   -  walking 10-20 minutes daily; her goal is 30 minutes daily - not adding salt  - saw cardiology ([facility]) 10/24 - continue carvedilol 12.5mg  BID - continue valsartan 80mg  daily; current BP will not tolerate changing this to entresto - continue jardiance 10mg  daily - continue spironolactone 12.5mg  daily - taking her furosemide if weight gets >231 pounds   - BNP 04/08/21 was 223.9   2: HTN- - BP  - saw PCP Thomasena Edis) 06/24 - BMP 03/04/23 reviewed and showed sodium 140, potassium 4.1, creatinine 0.94 & GFR 65  3: Stage 2 moderate COPD- - using spiriva - saw pulmonology Jayme Cloud) 10/24  4: DM- - A1c 09/29/22 was 6.1% - currently diet controlled. - saw [facility] 03/24

## 2023-05-11 NOTE — Telephone Encounter (Signed)
Patient did not show for her Heart Failure Clinic appointment on 05/11/23.

## 2023-05-13 ENCOUNTER — Telehealth: Payer: Self-pay | Admitting: Family

## 2023-05-13 NOTE — Telephone Encounter (Signed)
Patient called back to confirm 05/18/23 appt

## 2023-05-13 NOTE — Telephone Encounter (Signed)
Lvm for confirmation for appt 05/18/23

## 2023-05-18 ENCOUNTER — Encounter: Payer: Self-pay | Admitting: Family

## 2023-05-18 ENCOUNTER — Ambulatory Visit: Payer: PRIVATE HEALTH INSURANCE | Attending: Family | Admitting: Family

## 2023-05-18 VITALS — BP 98/64 | HR 74 | Wt 248.0 lb

## 2023-05-18 DIAGNOSIS — I2510 Atherosclerotic heart disease of native coronary artery without angina pectoris: Secondary | ICD-10-CM | POA: Diagnosis not present

## 2023-05-18 DIAGNOSIS — I10 Essential (primary) hypertension: Secondary | ICD-10-CM | POA: Diagnosis not present

## 2023-05-18 DIAGNOSIS — Z79899 Other long term (current) drug therapy: Secondary | ICD-10-CM | POA: Diagnosis not present

## 2023-05-18 DIAGNOSIS — Z87891 Personal history of nicotine dependence: Secondary | ICD-10-CM | POA: Diagnosis not present

## 2023-05-18 DIAGNOSIS — E785 Hyperlipidemia, unspecified: Secondary | ICD-10-CM | POA: Diagnosis not present

## 2023-05-18 DIAGNOSIS — E119 Type 2 diabetes mellitus without complications: Secondary | ICD-10-CM | POA: Diagnosis not present

## 2023-05-18 DIAGNOSIS — I5032 Chronic diastolic (congestive) heart failure: Secondary | ICD-10-CM | POA: Diagnosis not present

## 2023-05-18 DIAGNOSIS — E669 Obesity, unspecified: Secondary | ICD-10-CM | POA: Diagnosis not present

## 2023-05-18 DIAGNOSIS — I959 Hypotension, unspecified: Secondary | ICD-10-CM | POA: Insufficient documentation

## 2023-05-18 DIAGNOSIS — J449 Chronic obstructive pulmonary disease, unspecified: Secondary | ICD-10-CM | POA: Diagnosis not present

## 2023-05-18 DIAGNOSIS — I110 Hypertensive heart disease with heart failure: Secondary | ICD-10-CM | POA: Diagnosis present

## 2023-05-18 NOTE — Patient Instructions (Addendum)
Decrease valsartan to 40mg  daily (1/2 tablet daily)   Follow up as needed  Happy Holiday!

## 2023-05-18 NOTE — Progress Notes (Signed)
PCP: Irena Reichmann, DO (last seen 06/24) Primary Cardiologist: Thurston Hole, MD  (last seen 10/24)   HPI:   Sarah [name] is a 71 y/o female with a history of DM, HTN, COPD, hyperlipidemia, prediabetes, obesity, previous tobacco use and chronic heart failure. CT chest 2020 with minimal coronary artery calcification.   Was in the ED 03/04/23 due to chest pain. EKG and troponins were negative.   Echo 03/28/14: EF 65-70% with Grade I DD, mild LAE Echo 08/28/14: EF 60-65% with mild MR, mild LAE, PA pressure of 33 mmHg Echo 01/04/16: EF 55-60% with grade I DD, mild/ moderate LAE, PA Pressure of 35 mmHg Echo 05/23/19: EF 55-60% with Grade I DD, mild LAE Echo 04/09/21: EF of 45-50% along with mild MR.  Echo 10/09/22: EF 60-65% along with mild MR.  Echo 03/30/23: EF >55%   She presents today for a HF follow-up visit with a chief complaint of intermittent shortness of breath with recent cold symptoms. Has associated minimal fatigue, improving chest pain and nasal congestion along with this. Denies cough, palpitations, abdominal distention, pedal edema, dizziness, difficulty sleeping or weight gain. Reports sleeping well chronically on 3 pillows.   Saw cardiology ~ 6 weeks ago and had isosorbide increased due to her chest pain which she says has helped. Has noticed that her blood pressure is lower since that time.   ROS: All systems negative except as listed in HPI, PMH and Problem List.  SH:  Social History   Socioeconomic History   Marital status: Married    Spouse name: Not on file   Number of children: 3   Years of education: 12   Highest education level: Not on file  Occupational History   Occupation: Human resources officer  Tobacco Use   Smoking status: Former    Current packs/day: 0.00    Average packs/day: 0.5 packs/day for 27.0 years (13.5 ttl pk-yrs)    Types: Cigarettes    Start date: 01/14/1969    Quit date: 01/15/1996    Years since quitting: 27.3   Smokeless tobacco:  Never  Vaping Use   Vaping status: Never Used  Substance and Sexual Activity   Alcohol use: Yes    Alcohol/week: 2.0 - 3.0 standard drinks of alcohol    Types: 1 - 2 Glasses of wine, 1 Cans of beer per week    Comment: WINE WEEKLY   Drug use: No   Sexual activity: Not on file  Other Topics Concern   Not on file  Social History Narrative   Not on file   Social Determinants of Health   Financial Resource Strain: Not on file  Food Insecurity: Not on file  Transportation Needs: Not on file  Physical Activity: Not on file  Stress: Not on file  Social Connections: Not on file  Intimate Partner Violence: Not on file    FH:  Family History  Problem Relation Age of Onset   Stroke Father    Hypertension Father    Hypertension Mother    Stroke Mother    Breast cancer Cousin        2 mat cousins   Breast cancer Cousin        pat cousin    Past Medical History:  Diagnosis Date   Arthritis    Carotid artery disease (HCC)    Congestive heart failure (HCC)    COPD (chronic obstructive pulmonary disease) (HCC)    Diabetes mellitus without complication (HCC)    pt denies, however is  on metformin daily   Dyspnea    HBP (high blood pressure)    Heart murmur    History of kidney stones     Current Outpatient Medications  Medication Sig Dispense Refill   acetaminophen (TYLENOL) 500 MG tablet Take 500 mg by mouth every 6 (six) hours as needed for moderate pain.     albuterol (VENTOLIN HFA) 108 (90 Base) MCG/ACT inhaler Inhale 1-2 puffs into the lungs every 6 (six) hours as needed for wheezing or shortness of breath.     ascorbic acid (VITAMIN C) 1000 MG tablet Take 1,000 mg by mouth daily.     aspirin 81 MG tablet Take 81 mg by mouth daily.     carvedilol (COREG) 12.5 MG tablet Take 12.5 mg by mouth 2 (two) times daily with a meal.     Cholecalciferol (VITAMIN D) 50 MCG (2000 UT) CAPS Take 2,000 Units by mouth daily.     cyclobenzaprine (FLEXERIL) 5 MG tablet Take 5 mg by mouth  at bedtime as needed.     docusate sodium (COLACE) 100 MG capsule Take 1 capsule (100 mg total) by mouth daily as needed for up to 30 doses. (Patient not taking: Reported on 03/19/2023) 30 capsule 0   furosemide (LASIX) 40 MG tablet Take 20 mg by mouth daily.     hydroxypropyl methylcellulose / hypromellose (ISOPTO TEARS / GONIOVISC) 2.5 % ophthalmic solution Place 1 drop into both eyes as needed for dry eyes. (Patient not taking: Reported on 03/19/2023)     isosorbide mononitrate (IMDUR) 30 MG 24 hr tablet Take 30 mg by mouth daily.  1   JARDIANCE 10 MG TABS tablet TAKE 1 TABLET BY MOUTH EVERY DAY BEFORE BREAKFAST 30 tablet 5   Menthol-Methyl Salicylate (SALONPAS PAIN RELIEF PATCH) PTCH Place 1 patch onto the skin daily as needed (pain).     Multiple Vitamins-Minerals (CENTRUM SILVER PO) Take 1 tablet by mouth daily.     oxyCODONE-acetaminophen (PERCOCET) 5-325 MG tablet Take 1 tablet by mouth every 4 (four) hours as needed for up to 12 doses for severe pain. (Patient not taking: Reported on 03/19/2023) 12 tablet 0   simvastatin (ZOCOR) 40 MG tablet Take 40 mg by mouth at bedtime.     spironolactone (ALDACTONE) 25 MG tablet Take 0.5 tablets (12.5 mg total) by mouth daily. 30 tablet 0   Tiotropium Bromide-Olodaterol (STIOLTO RESPIMAT) 2.5-2.5 MCG/ACT AERS Inhale 2 puffs into the lungs daily. 8 g 0   tiZANidine (ZANAFLEX) 2 MG tablet Take 2 mg by mouth at bedtime.     valsartan (DIOVAN) 80 MG tablet Take 80 mg by mouth daily.     No current facility-administered medications for this visit.   Vitals:   05/18/23 0919  BP: 98/64  Pulse: 74  SpO2: 98%  Weight: 248 lb (112.5 kg)   Wt Readings from Last 3 Encounters:  05/18/23 248 lb (112.5 kg)  03/19/23 248 lb 9.6 oz (112.8 kg)  10/17/22 247 lb 9.6 oz (112.3 kg)   Lab Results  Component Value Date   CREATININE 0.82 09/29/2022   CREATININE 1.00 03/07/2022   CREATININE 0.91 04/11/2021   PHYSICAL EXAM:  General:  Well appearing. No resp  difficulty HEENT: normal Neck: supple. JVP flat. No lymphadenopathy or thryomegaly appreciated. Cor: PMI normal. Regular rate & rhythm. No rubs, gallops or murmurs. Lungs: clear Abdomen: soft, nontender, nondistended. No hepatosplenomegaly. No bruits or masses.  Extremities: no cyanosis, clubbing, rash, trace pitting edema bilateral lower legs Neuro: alert &  orientedx3, cranial nerves grossly intact. Moves all 4 extremities w/o difficulty. Affect pleasant.   ECG: not done   ASSESSMENT & PLAN:  1: Chronic heart failure with preserved ejection fraction- - NYHA class II - euvolemic today - weighing daily; reminded to call for an overnight weight gain of > 2 pounds or a weekly weight gain of > 5 pounds - weight stable from last visit here 7 months ago - Echo 03/28/14: EF 65-70% with Grade I DD, mild LAE - Echo 08/28/14: EF 60-65% with mild MR, mild LAE, PA pressure of 33 mmHg - Echo 01/04/16: EF 55-60% with grade I DD, mild/ moderate LAE, PA Pressure of 35 mmHg - Echo 05/23/19: EF 55-60% with Grade I DD, mild LAE - Echo 04/09/21: EF of 45-50% along with mild MR.  - Echo 10/09/22: EF 60-65% along with mild MR.  - Echo 03/30/23: EF >55%  - walking 10-20 minutes daily; her goal is 30 minutes daily - not adding salt  - saw cardiology ([facility]) 10/24 & had isosorbide increased to 60mg  daily - continue carvedilol 12.5mg  BID - continue furosemide 20mg  daily - continue isosorbide 60mg  daily - continue jardiance 10mg  daily - continue spironolactone 12.5mg  daily - decrease valsartan to 40mg  daily due to lower blood pressure - BNP 04/08/21 was 223.9  2: HTN- - BP 98/64 - saw PCP Thomasena Edis) 06/24 - BMP 03/04/23 reviewed and showed sodium 140, potassium 4.1, creatinine 0.94 & GFR 65  3: Stage 2 moderate COPD- - using spiriva - saw pulmonology Jayme Cloud) 10/24  4: DM- - A1c 09/29/22 was 6.1% - currently diet controlled. - saw [facility] 03/24   Due to HF stability, will not make a  return appointment at this time. Advised to follow closely with cardiology but that she could call for questions or to make another appointment and she was comfortable with this plan.

## 2023-05-29 DIAGNOSIS — R7309 Other abnormal glucose: Secondary | ICD-10-CM | POA: Diagnosis not present

## 2023-05-29 DIAGNOSIS — E559 Vitamin D deficiency, unspecified: Secondary | ICD-10-CM | POA: Diagnosis not present

## 2023-05-29 DIAGNOSIS — E7800 Pure hypercholesterolemia, unspecified: Secondary | ICD-10-CM | POA: Diagnosis not present

## 2023-05-29 DIAGNOSIS — I10 Essential (primary) hypertension: Secondary | ICD-10-CM | POA: Diagnosis not present

## 2023-06-05 DIAGNOSIS — I5032 Chronic diastolic (congestive) heart failure: Secondary | ICD-10-CM | POA: Diagnosis not present

## 2023-06-05 DIAGNOSIS — I2510 Atherosclerotic heart disease of native coronary artery without angina pectoris: Secondary | ICD-10-CM | POA: Diagnosis not present

## 2023-06-05 DIAGNOSIS — M1710 Unilateral primary osteoarthritis, unspecified knee: Secondary | ICD-10-CM | POA: Diagnosis not present

## 2023-06-05 DIAGNOSIS — R946 Abnormal results of thyroid function studies: Secondary | ICD-10-CM | POA: Diagnosis not present

## 2023-06-05 DIAGNOSIS — E7800 Pure hypercholesterolemia, unspecified: Secondary | ICD-10-CM | POA: Diagnosis not present

## 2023-06-05 DIAGNOSIS — M47816 Spondylosis without myelopathy or radiculopathy, lumbar region: Secondary | ICD-10-CM | POA: Diagnosis not present

## 2023-06-05 DIAGNOSIS — R7309 Other abnormal glucose: Secondary | ICD-10-CM | POA: Diagnosis not present

## 2023-06-05 DIAGNOSIS — I10 Essential (primary) hypertension: Secondary | ICD-10-CM | POA: Diagnosis not present

## 2023-06-12 ENCOUNTER — Ambulatory Visit: Payer: PPO | Admitting: Pulmonary Disease

## 2023-06-12 ENCOUNTER — Encounter: Payer: Self-pay | Admitting: Pulmonary Disease

## 2023-06-12 VITALS — BP 96/60 | HR 64 | Temp 97.6°F | Ht 63.0 in | Wt 249.4 lb

## 2023-06-12 DIAGNOSIS — I5042 Chronic combined systolic (congestive) and diastolic (congestive) heart failure: Secondary | ICD-10-CM | POA: Diagnosis not present

## 2023-06-12 DIAGNOSIS — J449 Chronic obstructive pulmonary disease, unspecified: Secondary | ICD-10-CM

## 2023-06-12 DIAGNOSIS — G4730 Sleep apnea, unspecified: Secondary | ICD-10-CM | POA: Diagnosis not present

## 2023-06-12 MED ORDER — SPIRIVA RESPIMAT 2.5 MCG/ACT IN AERS: 2.0000 | INHALATION_SPRAY | Freq: Every day | RESPIRATORY_TRACT | 0 refills | Status: DC

## 2023-06-12 NOTE — Progress Notes (Signed)
Subjective:    Patient ID: Sarah [name], female    DOB: 06/25/1950, 72 y.o.   MRN: [id number]  Patient Care Team: Irena Reichmann, DO as PCP - General (Family Medicine) Sherrie Mustache, MD as Consulting Physician (Internal Medicine) Lemar Livings, Merrily Pew, MD (General Surgery) Salena Saner, MD as Consulting Physician (Pulmonary Disease)  Chief Complaint  Patient presents with   Follow-up    No cough, shortness of breath or wheezing.     BACKGROUND/INTERVAL:72 year old female, former smoker (13.5 PY), who follows here for the issue of moderate COPD and dyspnea on exertion.  This is a scheduled visit.  Patient was last seen by me on 19 March 2023.  Continues to be on Spiriva 2.5 mcg, 2 inhalations daily. She has not had any COPD exacerbations nor hospital admissions since her last visit.  She also has combined systolic and diastolic heart failure and follows up with cardiology in this regard.  No decompensation of this issue.  She had PFTs ordered on last visit however these have not been performed yet.  She had sleep study performed 07 April 2023 that showed very mild OSA with AHI of 6.7/h.  HPI Discussed the use of AI scribe software for clinical note transcription with the patient, who gave verbal consent to proceed.  History of Present Illness   The patient, diagnosed with stage 2 COPD, reports generally good respiratory status. She notes that her heart medications were recently adjusted, in particular the Diovan, decreased due to low blood pressures. She has been diagnosed with very mild sleep apnea, which is currently being monitored without the use of CPAP.  The patient reports regular sleep interruptions, waking up around two or three o'clock in the morning. She does not attribute these awakenings to any specific discomfort or difficulty, but notes that she often has to adjust her pillows upon waking. She sleeps on two pillows and prefers to sleep on her side, predominantly the  left. She has tried various methods to improve her sleep, including taking cyclobenzaprine before bed and using relaxing shower products, but still experiences regular interruptions.  Regarding her COPD management, the patient is currently on Spiriva, which she finds effective but expensive. She expresses hope that changes in her insurance coverage in the coming year might alleviate some of the financial burden of this medication.      DATA: 04/29/2016 PFTs: FEV1 1.59 L or 82% predicted, FVC 2.39 L or 98% predicted, FEV1/FVC 67%, diffusion capacity 70%.  Consistent with mild obstruction, concomitant restriction likely due to obesity. 03/17/2019 CT chest: No parenchymal abnormalities, specifically no lung nodule.  No emphysema. 04/08/2021 chest x-ray PA and lateral: Diffuse right lung airspace disease consistent with multifocal pneumonia. 04/09/2021 echocardiogram: LVEF 45 to 50% with mild decreased function in the LV.  No wall motion abnormalities.  Diastolic dysfunction grade 1.  Mild mitral regurgitation. 05/08/2021 chest x-ray PA lateral: No acute cardiopulmonary disease, pneumonia resolved. 06/25/2021 PFTs: FEV1 1.78 L or 100% predicted, FVC 2.37 L or 103% of predicted.  FEV1/FVC 75% lung volumes normal, no bronchodilator response diffusion capacity 82%, low end of normal.  Compared to PFTs performed November 2017 there has been improvement. 10/09/2022 echocardiogram: LVEF 60 to 65%, grade 1 diastolic dysfunction, normal right ventricular function.  Mild mitral valve regurgitation, no aortic stenosis present. 04/07/2023 Home sleep study: Very mild sleep apnea with minimal to no oxygen saturations during sleep.  AHI of 6.7/h.  Being treated with lifestyle modifications.  Review of Systems A 10 point review  of systems was performed and it is as noted above otherwise negative.   Patient Active Problem List   Diagnosis Date Noted   Colon cancer screening    Impaired fasting glucose    Acute  pain of right shoulder    Acute on chronic combined systolic and diastolic CHF (congestive heart failure) (HCC) 04/08/2021   Diabetes mellitus without complication (HCC)    COPD (chronic obstructive pulmonary disease) (HCC)    HLD (hyperlipidemia)    CAD (coronary artery disease)    Chest pain    Osteoarthritis of knee 07/09/2017   Patellofemoral stress syndrome 07/09/2017   Venous insufficiency of both lower extremities 02/11/2017   Chronic diastolic CHF (congestive heart failure), NYHA class 2 (HCC) 05/12/2016   Controlled type 2 diabetes mellitus without complication, without long-term current use of insulin (HCC) 05/12/2016   Essential hypertension 04/04/2016   Mixed hyperlipidemia 04/04/2016   Other diseases of stomach and duodenum    Problems with swallowing and mastication    Left buttock abscess 12/15/2014   DOE (dyspnea on exertion) 06/05/2014   COPD type A (HCC) 06/05/2014   Morbid obesity with BMI of 40.0-44.9, adult (HCC) 06/05/2014    Social History   Tobacco Use   Smoking status: Former    Current packs/day: 0.00    Average packs/day: 0.5 packs/day for 27.0 years (13.5 ttl pk-yrs)    Types: Cigarettes    Start date: 01/14/1969    Quit date: 01/15/1996    Years since quitting: 27.4   Smokeless tobacco: Never  Substance Use Topics   Alcohol use: Yes    Alcohol/week: 2.0 - 3.0 standard drinks of alcohol    Types: 1 - 2 Glasses of wine, 1 Cans of beer per week    Comment: WINE WEEKLY    No Known Allergies  Current Meds  Medication Sig   acetaminophen (TYLENOL) 500 MG tablet Take 500 mg by mouth every 6 (six) hours as needed for moderate pain.   albuterol (VENTOLIN HFA) 108 (90 Base) MCG/ACT inhaler Inhale 1-2 puffs into the lungs every 6 (six) hours as needed for wheezing or shortness of breath.   ascorbic acid (VITAMIN C) 1000 MG tablet Take 1,000 mg by mouth daily.   aspirin 81 MG tablet Take 81 mg by mouth daily.   carvedilol (COREG) 12.5 MG tablet Take 12.5  mg by mouth 2 (two) times daily with a meal.   Cholecalciferol (VITAMIN D) 50 MCG (2000 UT) CAPS Take 2,000 Units by mouth daily.   furosemide (LASIX) 40 MG tablet Take 20 mg by mouth daily.   isosorbide mononitrate (IMDUR) 60 MG 24 hr tablet Take 60 mg by mouth daily.   JARDIANCE 10 MG TABS tablet TAKE 1 TABLET BY MOUTH EVERY DAY BEFORE BREAKFAST   Menthol-Methyl Salicylate (SALONPAS PAIN RELIEF PATCH) PTCH Place 1 patch onto the skin daily as needed (pain).   Multiple Vitamins-Minerals (CENTRUM SILVER PO) Take 1 tablet by mouth daily.   simvastatin (ZOCOR) 40 MG tablet Take 40 mg by mouth at bedtime.   spironolactone (ALDACTONE) 25 MG tablet Take 0.5 tablets (12.5 mg total) by mouth daily.   tiotropium (SPIRIVA) 18 MCG inhalation capsule Place 18 mcg into inhaler and inhale daily.   Tiotropium Bromide Monohydrate (SPIRIVA RESPIMAT) 2.5 MCG/ACT AERS Inhale 2 puffs into the lungs daily.   tiZANidine (ZANAFLEX) 2 MG tablet Take 2 mg by mouth at bedtime.   valsartan (DIOVAN) 80 MG tablet Take 40 mg by mouth daily.  Immunization History  Administered Date(s) Administered   Fluad Quad(high Dose 65+) 05/08/2021, 04/11/2022   Influenza, High Dose Seasonal PF 04/09/2018, 04/16/2023   Influenza, Quadrivalent, Recombinant, Inj, Pf 04/18/2019, 04/30/2020   Influenza-Unspecified 03/02/2014, 04/18/2019   Moderna Sars-Covid-2 Vaccination 07/07/2019, 07/29/2019, 08/29/2019   PNEUMOCOCCAL CONJUGATE-20 01/30/2021   PPD Test 08/19/2019, 09/21/2020, 09/09/2021, 09/19/2022   Pneumococcal Polysaccharide-23 05/17/2015        Objective:     BP 96/60 (BP Location: Left Arm, Patient Position: Sitting, Cuff Size: Normal)   Pulse 64   Temp 97.6 F (36.4 C) (Temporal)   Ht 5\' 3"  (1.6 m)   Wt 249 lb 6.4 oz (113.1 kg)   SpO2 95%   BMI 44.18 kg/m   SpO2: 95 %  GENERAL: Obese woman, no acute distress, fully ambulatory.  No conversational dyspnea. HEAD: Normocephalic, atraumatic.  EYES: Pupils equal,  round, reactive to light.  No scleral icterus.  MOUTH: Dentition is full.  Oral mucosa moist.  No thrush. NECK: Supple. No thyromegaly. Trachea midline. No JVD.  No adenopathy. PULMONARY: Good air entry bilaterally.  No adventitious sounds. CARDIOVASCULAR: S1 and S2. Regular rate and rhythm.  No rubs, murmurs or gallops heard. ABDOMEN: Obese otherwise benign. MUSCULOSKELETAL: No joint deformity, no clubbing, no edema.  NEUROLOGIC: No overt focal deficit, no gait disturbance, speech is fluent. SKIN: Intact,warm,dry. PSYCH: Mood and behavior normal.   Assessment & Plan:     ICD-10-CM   1. Stage 2 moderate COPD by GOLD classification (HCC)  J44.9     2. Chronic combined systolic and diastolic CHF, NYHA class 2 (HCC)  I50.42     3. Sleep apnea in adult - very mild  G47.30    Positional therapy Weight loss recommended      No orders of the defined types were placed in this encounter.   Meds ordered this encounter  Medications   Tiotropium Bromide Monohydrate (SPIRIVA RESPIMAT) 2.5 MCG/ACT AERS    Sig: Inhale 2 puffs into the lungs daily.    Dispense:  3 each    Refill:  0   Discussion:    Chronic Obstructive Pulmonary Disease (COPD) - Stage 2 Well-managed stage 2 COPD with no acute exacerbations. Continues effective use of Spiriva despite cost concerns. Provided informed consent on Spiriva's side effects and adherence importance. Discussed financial burden and provided samples. - Provide Spiriva samples - Encouraged to schedule PFTs ordered during last visit - Schedule follow-up in 4-6 months  Mild Sleep Apnea Mild sleep apnea with no significant symptoms requiring CPAP. Reports nocturnal awakenings not related to breathing issues. Discussed weight loss, exercise, and sleep hygiene to improve symptoms. Emphasized benefits of a 10-pound weight loss. - Monitor sleep patterns - Encourage weight loss - Recommend daily exercise - Advise on sleep hygiene practices  General  Health Maintenance Emphasized the importance of lifestyle modifications for overall health, including exercise and maintaining a healthy weight. - Encourage regular physical activity - Advise on maintaining a healthy weight  Follow-up - Follow-up appointment in 4-6 months.      Advised if symptoms do not improve or worsen, to please contact office for sooner follow up or seek emergency care.    I spent 33 minutes of dedicated to the care of this patient on the date of this encounter to include pre-visit review of records, face-to-face time with the patient discussing conditions above, post visit ordering of testing, clinical documentation with the electronic health record, making appropriate referrals as documented, and communicating necessary findings to  members of the patients care team.   C. Danice Goltz, MD Advanced Bronchoscopy PCCM [facility]    *This note was generated using voice recognition software/Dragon and/or AI transcription program.  Despite best efforts to proofread, errors can occur which can change the meaning. Any transcriptional errors that result from this process are unintentional and may not be fully corrected at the time of dictation.

## 2023-06-12 NOTE — Patient Instructions (Signed)
VISIT SUMMARY:  During today's visit, we discussed your current health status, focusing on your COPD management and sleep apnea. We also reviewed general health maintenance and lifestyle recommendations to support your overall well-being.  YOUR PLAN:  -CHRONIC OBSTRUCTIVE PULMONARY DISEASE (COPD) - STAGE 2: COPD is a chronic lung condition that makes it hard to breathe. Your COPD is well-managed with Spiriva, which you find effective despite its cost. We provided you with samples of Spiriva and discussed its side effects and the importance of adherence. We will follow up in 4-6 months to monitor your condition.  -MILD SLEEP APNEA: Sleep apnea is a condition where breathing repeatedly stops and starts during sleep. Your mild sleep apnea does not currently require a CPAP machine. We discussed ways to improve your sleep, including weight loss, regular exercise, and good sleep hygiene practices. Monitoring your sleep patterns and aiming for a 10-pound weight loss can help alleviate symptoms.  -GENERAL HEALTH MAINTENANCE: Maintaining a healthy lifestyle is crucial for your overall well-being. We emphasized the importance of regular physical activity and maintaining a healthy weight to support your health. Please continue to engage in regular exercise and focus on a balanced diet.  INSTRUCTIONS:  Please schedule a follow-up appointment in 4-6 months to review your COPD management and overall health. Continue to monitor your sleep patterns and work on the lifestyle recommendations we discussed.

## 2023-08-10 ENCOUNTER — Other Ambulatory Visit: Payer: Self-pay | Admitting: Family

## 2023-08-27 DIAGNOSIS — R7309 Other abnormal glucose: Secondary | ICD-10-CM | POA: Diagnosis not present

## 2023-08-27 DIAGNOSIS — I2510 Atherosclerotic heart disease of native coronary artery without angina pectoris: Secondary | ICD-10-CM | POA: Diagnosis not present

## 2023-08-27 DIAGNOSIS — R946 Abnormal results of thyroid function studies: Secondary | ICD-10-CM | POA: Diagnosis not present

## 2023-08-27 DIAGNOSIS — I10 Essential (primary) hypertension: Secondary | ICD-10-CM | POA: Diagnosis not present

## 2023-08-27 DIAGNOSIS — E7800 Pure hypercholesterolemia, unspecified: Secondary | ICD-10-CM | POA: Diagnosis not present

## 2023-09-03 DIAGNOSIS — E7800 Pure hypercholesterolemia, unspecified: Secondary | ICD-10-CM | POA: Diagnosis not present

## 2023-09-03 DIAGNOSIS — I2510 Atherosclerotic heart disease of native coronary artery without angina pectoris: Secondary | ICD-10-CM | POA: Diagnosis not present

## 2023-09-03 DIAGNOSIS — I10 Essential (primary) hypertension: Secondary | ICD-10-CM | POA: Diagnosis not present

## 2023-09-03 DIAGNOSIS — I5032 Chronic diastolic (congestive) heart failure: Secondary | ICD-10-CM | POA: Diagnosis not present

## 2023-09-03 DIAGNOSIS — Z87442 Personal history of urinary calculi: Secondary | ICD-10-CM | POA: Diagnosis not present

## 2023-09-03 DIAGNOSIS — R519 Headache, unspecified: Secondary | ICD-10-CM | POA: Diagnosis not present

## 2023-09-03 DIAGNOSIS — M1710 Unilateral primary osteoarthritis, unspecified knee: Secondary | ICD-10-CM | POA: Diagnosis not present

## 2023-09-03 DIAGNOSIS — M47816 Spondylosis without myelopathy or radiculopathy, lumbar region: Secondary | ICD-10-CM | POA: Diagnosis not present

## 2023-09-03 DIAGNOSIS — R7309 Other abnormal glucose: Secondary | ICD-10-CM | POA: Diagnosis not present

## 2023-09-09 ENCOUNTER — Other Ambulatory Visit: Payer: Self-pay | Admitting: Pulmonary Disease

## 2023-09-18 NOTE — Progress Notes (Signed)
 This encounter was created in error - please disregard.

## 2023-10-05 DIAGNOSIS — E66813 Obesity, class 3: Secondary | ICD-10-CM | POA: Diagnosis not present

## 2023-10-05 DIAGNOSIS — I5032 Chronic diastolic (congestive) heart failure: Secondary | ICD-10-CM | POA: Diagnosis not present

## 2023-10-05 DIAGNOSIS — I2510 Atherosclerotic heart disease of native coronary artery without angina pectoris: Secondary | ICD-10-CM | POA: Diagnosis not present

## 2023-10-05 DIAGNOSIS — E782 Mixed hyperlipidemia: Secondary | ICD-10-CM | POA: Diagnosis not present

## 2023-10-05 DIAGNOSIS — Z6841 Body Mass Index (BMI) 40.0 and over, adult: Secondary | ICD-10-CM | POA: Diagnosis not present

## 2023-10-05 DIAGNOSIS — I10 Essential (primary) hypertension: Secondary | ICD-10-CM | POA: Diagnosis not present

## 2023-10-20 ENCOUNTER — Ambulatory Visit: Payer: Self-pay | Admitting: Pulmonary Disease

## 2023-10-20 NOTE — Telephone Encounter (Signed)
 Noted. Nothing further needed.

## 2023-10-20 NOTE — Telephone Encounter (Signed)
  Chief Complaint: info only Additional Notes: pt calling to schedule pulm F/U appt, but PAS transferred to NT d/t pt mentioning SOB. Upon further investigation, pt reports that her SOB is at baseline and is using respiratory INH as directed, as well as occasionally using rescue INH that provides relief. Pt denies any acute sx at this time. [facility]ager scheduled office appt per pt request.   Copied from CRM [id number]. Topic: Clinical - Red Word Triage >> Oct 20, 2023  2:30 PM Tyronne Galloway wrote: Red Word that prompted transfer to Nurse Triage: Pt was scheduling follow up appt with Dr. Viva Grise, not scheduled, and she mentioned having shortness of breath here and there in the last couple weeks. Reason for Disposition  Requesting regular office appointment  Answer Assessment - Initial Assessment Questions E2C2 Pulmonary Triage - Initial Assessment Questions "Chief Complaint (e.g., cough, sob, wheezing, fever, chills, sweat or additional symptoms) *Go to specific symptom protocol after initial questions. Mild SOB - denies above baseline Reports saw cards in March/April - and changed some prescription   "Have you used your inhalers/maintenance medication?" Yes If yes, "What medications?" Respimat 2 puffs daily in AM Albuterol  INH PRN - last used last night - reports provided relief  Protocols used: Breathing Difficulty-A-AH, Information Only Call - No Triage-A-AH

## 2023-10-27 ENCOUNTER — Ambulatory Visit (LOCAL_COMMUNITY_HEALTH_CENTER): Payer: Self-pay

## 2023-10-27 DIAGNOSIS — Z111 Encounter for screening for respiratory tuberculosis: Secondary | ICD-10-CM

## 2023-10-29 ENCOUNTER — Ambulatory Visit

## 2023-10-29 DIAGNOSIS — Z111 Encounter for screening for respiratory tuberculosis: Secondary | ICD-10-CM

## 2023-10-29 LAB — TB SKIN TEST
Induration: 0 mm
TB Skin Test: NEGATIVE

## 2023-10-30 ENCOUNTER — Encounter: Payer: Self-pay | Admitting: Emergency Medicine

## 2023-10-30 ENCOUNTER — Ambulatory Visit
Admission: EM | Admit: 2023-10-30 | Discharge: 2023-10-30 | Disposition: A | Discharge status: Home / Self Care | Attending: Emergency Medicine | Admitting: Emergency Medicine

## 2023-10-30 DIAGNOSIS — M5432 Sciatica, left side: Secondary | ICD-10-CM | POA: Diagnosis not present

## 2023-10-30 MED ORDER — BACLOFEN 10 MG PO TABS: 10.0000 mg | ORAL_TABLET | Freq: Three times a day (TID) | ORAL | 0 refills | Status: DC

## 2023-10-30 MED ORDER — PREDNISONE 10 MG (21) PO TBPK: ORAL_TABLET | ORAL | 0 refills | Status: DC

## 2023-10-30 MED ORDER — DEXAMETHASONE SODIUM PHOSPHATE 10 MG/ML IJ SOLN
10.0000 mg | Freq: Once | INTRAMUSCULAR | Status: AC
  Administered 2023-10-30: 10 mg via INTRAMUSCULAR

## 2023-10-30 NOTE — ED Triage Notes (Addendum)
 Pt c/o left leg pain. She states it starts at the thigh and runs down the to her calf. Started about 2 days ago. Denies swelling or injury.

## 2023-10-30 NOTE — ED Provider Notes (Signed)
 [facility]    CSN: [id number] Arrival date & time: 10/30/23  1542      History   Chief Complaint Chief Complaint  Patient presents with   Leg Pain    leg    HPI Sarah [name] is a 73 y.o. female.   HPI  73 year old female with past medical history significant for dyspnea on exertion, COPD, obesity, type 2 diabetes, CHF, mixed hyperlipidemia, and hypertension presents for evaluation of pain in her left leg that has been going on for last 2 days.  She denies any pain in her back, injuries, or swelling to her leg.  She reports that laying on her left side and walking exacerbate the pain and she has not found any alleviating factors.  Last night the pain did cross over the top of her left foot and go to her left big toe.  Past Medical History:  Diagnosis Date   Arthritis    Carotid artery disease (HCC)    Congestive heart failure (HCC)    COPD (chronic obstructive pulmonary disease) (HCC)    Diabetes mellitus without complication (HCC)    pt denies, however is on metformin daily   Dyspnea    HBP (high blood pressure)    Heart murmur    History of kidney stones     Patient Active Problem List   Diagnosis Date Noted   Colon cancer screening    Impaired fasting glucose    Acute pain of right shoulder    Acute on chronic combined systolic and diastolic CHF (congestive heart failure) (HCC) 04/08/2021   Diabetes mellitus without complication (HCC)    COPD (chronic obstructive pulmonary disease) (HCC)    HLD (hyperlipidemia)    CAD (coronary artery disease)    Chest pain    Osteoarthritis of knee 07/09/2017   Patellofemoral stress syndrome 07/09/2017   Venous insufficiency of both lower extremities 02/11/2017   Chronic diastolic CHF (congestive heart failure), NYHA class 2 (HCC) 05/12/2016   Controlled type 2 diabetes mellitus without complication, without long-term current use of insulin (HCC) 05/12/2016   Essential hypertension 04/04/2016   Mixed  hyperlipidemia 04/04/2016   Other diseases of stomach and duodenum    Problems with swallowing and mastication    Left buttock abscess 12/15/2014   DOE (dyspnea on exertion) 06/05/2014   COPD type A (HCC) 06/05/2014   Morbid obesity with BMI of 40.0-44.9, adult (HCC) 06/05/2014    Past Surgical History:  Procedure Laterality Date   ABDOMINAL HYSTERECTOMY  06/16/1989   CARDIAC CATHETERIZATION  06/16/1997   CATARACT EXTRACTION W/ INTRAOCULAR LENS  IMPLANT, BILATERAL     COLONOSCOPY     COLONOSCOPY WITH PROPOFOL  N/A 12/09/2021   Procedure: COLONOSCOPY WITH PROPOFOL ;  Surgeon: Marnee Sink, MD;  Location: [facility];  Service: Endoscopy;  Laterality: N/A;   CYSTOSCOPY/URETEROSCOPY/HOLMIUM LASER/STENT PLACEMENT Right 10/17/2022   Procedure: CYSTOSCOPY/RIGHT RETROGRADE PYELOGRAM/RIGHT URETEROSCOPY/HOLMIUM LASER/RIGHT STENT PLACEMENT;  Surgeon: Lahoma Pigg, MD;  Location: [facility];  Service: Urology;  Laterality: Right;  60 MINUTES NEEDED FOR CASE   ESOPHAGEAL DILATION N/A 02/11/2016   Procedure: ESOPHAGEAL DILATION;  Surgeon: Marnee Sink, MD;  Location: [facility];  Service: Endoscopy;  Laterality: N/A;   ESOPHAGOGASTRODUODENOSCOPY (EGD) WITH PROPOFOL  N/A 02/11/2016   Procedure: ESOPHAGOGASTRODUODENOSCOPY (EGD) WITH PROPOFOL ;  Surgeon: Marnee Sink, MD;  Location: [facility];  Service: Endoscopy;  Laterality: N/A;  DIABETIC    OB History     Gravida  3   Para  3  Term      Preterm      AB      Living         SAB      IAB      Ectopic      Multiple      Live Births           Obstetric Comments  1st Menstrual Cycle:  13  1st Pregnancy:  17          Home Medications    Prior to Admission medications   Medication Sig Start Date End Date Taking? Authorizing Provider  albuterol  (VENTOLIN  HFA) 108 (90 Base) MCG/ACT inhaler Inhale 1-2 puffs into the lungs every 6 (six) hours as needed for wheezing or shortness of breath.   Yes [provider]  aspirin  81 MG tablet Take 81 mg by mouth daily.   Yes [provider]  baclofen (LIORESAL) 10 MG tablet Take 1 tablet (10 mg total) by mouth 3 (three) times daily. 10/30/23  Yes Kent Pear, NP  furosemide  (LASIX ) 40 MG tablet Take 20 mg by mouth daily.   Yes [provider]  isosorbide  mononitrate (IMDUR ) 60 MG 24 hr tablet Take 60 mg by mouth daily.   Yes [provider]  JARDIANCE  10 MG TABS tablet TAKE 1 TABLET BY MOUTH EVERY DAY BEFORE BREAKFAST 08/10/23  Yes Hackney, Brian Campanile A, FNP  Multiple Vitamins-Minerals (CENTRUM SILVER PO) Take 1 tablet by mouth daily.   Yes [provider]  predniSONE  (STERAPRED UNI-PAK 21 TAB) 10 MG (21) TBPK tablet Take 6 tablets on day 1, 5 tablets day 2, 4 tablets day 3, 3 tablets day 4, 2 tablets day 5, 1 tablet day 6 10/30/23  Yes Kent Pear, NP  simvastatin  (ZOCOR ) 40 MG tablet Take 40 mg by mouth at bedtime.   Yes [provider]  spironolactone  (ALDACTONE ) 25 MG tablet Take 0.5 tablets (12.5 mg total) by mouth daily. 04/12/21  Yes Wieting, Richard, MD  Tiotropium Bromide  Monohydrate (SPIRIVA  RESPIMAT) 2.5 MCG/ACT AERS INHALE 2 PUFFS BY MOUTH INTO THE LUNGS DAILY 09/09/23  Yes Marc Senior, MD  valsartan (DIOVAN) 80 MG tablet Take 40 mg by mouth daily.   Yes [provider]  acetaminophen  (TYLENOL ) 500 MG tablet Take 500 mg by mouth every 6 (six) hours as needed for moderate pain.    [provider]  ascorbic acid  (VITAMIN C) 1000 MG tablet Take 1,000 mg by mouth daily.    [provider]  carvedilol  (COREG ) 12.5 MG tablet Take 12.5 mg by mouth 2 (two) times daily with a meal.    [provider]  Cholecalciferol (VITAMIN D) 50 MCG (2000 UT) CAPS Take 2,000 Units by mouth daily.    [provider]  Menthol-Methyl Salicylate (SALONPAS PAIN RELIEF PATCH) PTCH Place 1 patch onto the skin daily as needed (pain).    [provider]  tiotropium (SPIRIVA )  18 MCG inhalation capsule Place 18 mcg into inhaler and inhale daily.    [provider]    Family History Family History  Problem Relation Age of Onset   Stroke Father    Hypertension Father    Hypertension Mother    Stroke Mother    Breast cancer Cousin        2 mat cousins   Breast cancer Cousin        pat cousin    Social History Social History   Tobacco Use   Smoking status: Former  Current packs/day: 0.00    Average packs/day: 0.5 packs/day for 27.0 years (13.5 ttl pk-yrs)    Types: Cigarettes    Start date: 01/14/1969    Quit date: 01/15/1996    Years since quitting: 27.8   Smokeless tobacco: Never  Vaping Use   Vaping status: Never Used  Substance Use Topics   Alcohol use: Yes    Alcohol/week: 2.0 - 3.0 standard drinks of alcohol    Types: 1 - 2 Glasses of wine, 1 Cans of beer per week    Comment: WINE WEEKLY   Drug use: No     Allergies   Patient has no known allergies.   Review of Systems Review of Systems  Musculoskeletal:  Negative for back pain.       Pain on outside of left leg.     Physical Exam Triage Vital Signs ED Triage Vitals [10/30/23 1623]  Encounter Vitals Group     BP      Systolic BP Percentile      Diastolic BP Percentile      Pulse      Resp      Temp      Temp src      SpO2      Weight 249 lb 5.4 oz (113.1 kg)     Height 5\' 3"  (1.6 m)     Head Circumference      Peak Flow      Pain Score 7     Pain Loc      Pain Education      Exclude from Growth Chart    No data found.  Updated Vital Signs BP 118/79 (BP Location: Left Arm)   Pulse 64   Temp 98.5 F (36.9 C) (Oral)   Resp 16   Ht 5\' 3"  (1.6 m)   Wt 249 lb 5.4 oz (113.1 kg)   SpO2 95%   BMI 44.17 kg/m   Visual Acuity Right Eye Distance:   Left Eye Distance:   Bilateral Distance:    Right Eye Near:   Left Eye Near:    Bilateral Near:     Physical Exam Vitals and nursing note reviewed.  Constitutional:      Appearance: Normal appearance.  She is not ill-appearing.  Musculoskeletal:        General: No tenderness or signs of injury.  Skin:    General: Skin is warm and dry.     Capillary Refill: Capillary refill takes less than 2 seconds.     Findings: No erythema.  Neurological:     General: No focal deficit present.     Mental Status: She is alert and oriented to person, place, and time.      UC Treatments / Results  Labs (all labs ordered are listed, but only abnormal results are displayed) Labs Reviewed - No data to display  EKG   Radiology No results found.  Procedures Procedures (including critical care time)  Medications Ordered in UC Medications - No data to display  Initial Impression / Assessment and Plan / UC Course  I have reviewed the triage vital signs and the nursing notes.  Pertinent labs & imaging results that were available during my care of the patient were reviewed by me and considered in my medical decision making (see chart for details).   Patient is a pleasant, nontoxic-appearing 73 year old female presenting for evaluation of pain in her left leg that started 2 days ago as outlined HPI above.  The patient  describes the pain as going down the outside of her left leg from her hip down to her ankle.  Last night it did cross over the top of her left foot and go to her left big toe, however, that portion has resolved.  She is not having any low back pain and I cannot produce any low back pain with palpation of her lumbar spine or the muscles of the lower lumbar paraspinous region.  Also no pain with palpation along the path of the sciatic nerve or with palpation of the lateral aspect of the thigh or with palpation of the left lower leg.  She has no calf tenderness and a negative Homans' sign on the left.  DP and PT pulses are 2+.  The patient does have a positive straight leg raise on the left however.  I suspect that the patient's pain is related to sciatic inflammation and I will treat her for  sciatica with 10 mg of IM Decadron  here in clinic followed by a 6-day prednisone  taper.  Also to milligrams of baclofen 3 times daily to help with muscle inflammation.  Additionally, I will prescribe home physical therapy exercises for her to perform.   Final Clinical Impressions(s) / UC Diagnoses   Final diagnoses:  Sciatica of left side     Discharge Instructions      Take the prednisone  according to the package instructions.  You will take it each morning with breakfast.  Make sure that you are always taking it with food.  Take the baclofen every 8 hours as needed for muscle spasm.  Follow the rehabilitation exercises in your discharge paperwork to help you with your sciatic pain.  Return for reevaluation for new or worsening symptoms, or see your primary care provider.    ED Prescriptions     Medication Sig Dispense Auth. Provider   predniSONE  (STERAPRED UNI-PAK 21 TAB) 10 MG (21) TBPK tablet Take 6 tablets on day 1, 5 tablets day 2, 4 tablets day 3, 3 tablets day 4, 2 tablets day 5, 1 tablet day 6 21 tablet Kent Pear, NP   baclofen (LIORESAL) 10 MG tablet Take 1 tablet (10 mg total) by mouth 3 (three) times daily. 30 each Kent Pear, NP      PDMP not reviewed this encounter.   Kent Pear, NP 10/30/23 [phone]

## 2023-10-30 NOTE — Discharge Instructions (Addendum)
Take the prednisone according to the package instructions.  You will take it each morning with breakfast.  Make sure that you are always taking it with food.  Take the baclofen every 8 hours as needed for muscle spasm.  Follow the rehabilitation exercises in your discharge paperwork to help you with your sciatic pain.  Return for reevaluation for new or worsening symptoms, or see your primary care provider.  

## 2023-11-02 ENCOUNTER — Other Ambulatory Visit: Payer: Self-pay

## 2023-11-02 ENCOUNTER — Emergency Department
Admission: EM | Admit: 2023-11-02 | Discharge: 2023-11-02 | Disposition: A | Discharge status: Home / Self Care | Attending: Emergency Medicine | Admitting: Emergency Medicine

## 2023-11-02 ENCOUNTER — Emergency Department

## 2023-11-02 DIAGNOSIS — R079 Chest pain, unspecified: Secondary | ICD-10-CM | POA: Diagnosis not present

## 2023-11-02 DIAGNOSIS — R0789 Other chest pain: Secondary | ICD-10-CM | POA: Diagnosis not present

## 2023-11-02 DIAGNOSIS — I10 Essential (primary) hypertension: Secondary | ICD-10-CM | POA: Diagnosis not present

## 2023-11-02 LAB — CBC
HCT: 45.5 % (ref 36.0–46.0)
Hemoglobin: 14.2 g/dL (ref 12.0–15.0)
MCH: 26.3 pg (ref 26.0–34.0)
MCHC: 31.2 g/dL (ref 30.0–36.0)
MCV: 84.4 fL (ref 80.0–100.0)
Platelets: 239 10*3/uL (ref 150–400)
RBC: 5.39 MIL/uL — ABNORMAL HIGH (ref 3.87–5.11)
RDW: 15.5 % (ref 11.5–15.5)
WBC: 9.6 10*3/uL (ref 4.0–10.5)
nRBC: 0 % (ref 0.0–0.2)

## 2023-11-02 LAB — BASIC METABOLIC PANEL WITH GFR
Anion gap: 8 (ref 5–15)
BUN: 17 mg/dL (ref 8–23)
CO2: 22 mmol/L (ref 22–32)
Calcium: 8.9 mg/dL (ref 8.9–10.3)
Chloride: 107 mmol/L (ref 98–111)
Creatinine, Ser: 0.74 mg/dL (ref 0.44–1.00)
GFR, Estimated: >60 mL/min (ref >60)
Glucose, Bld: 91 mg/dL (ref 70–99)
Potassium: 3.8 mmol/L (ref 3.5–5.1)
Sodium: 137 mmol/L (ref 135–145)

## 2023-11-02 LAB — TROPONIN I (HIGH SENSITIVITY)
Troponin I (High Sensitivity): 4 ng/L (ref <18)
Troponin I (High Sensitivity): 3 ng/L (ref <18)

## 2023-11-02 MED ORDER — FAMOTIDINE 20 MG PO TABS
20.0000 mg | ORAL_TABLET | Freq: Once | ORAL | Status: AC
Start: 2023-11-02 — End: 2023-11-02
  Administered 2023-11-02: 20 mg via ORAL
  Filled 2023-11-02: qty 1

## 2023-11-02 NOTE — ED Notes (Signed)
Pt wheeled to waiting room. Pt verbalized understanding of discharge instructions.   

## 2023-11-02 NOTE — Discharge Instructions (Signed)
 You were seen in the emergency department for chest pain.  You had 2 heart enzymes that were normal.  You had an EKG and chest x-ray that did not show any signs of abnormalities.  No findings of heart failure exacerbation.  You could be having gastritis from your recent steroid use.  You are given a dose of Pepcid  in the emergency department and restart your Pepcid  at home.  Call your cardiologist tomorrow to schedule close follow-up appointment and discuss further stress testing.  Return to the emergency department if you have worsening symptoms.  Prednisone  -you are given a prescription for a steroid.  It is important that you take this medication with food.  This medication can cause an upset stomach.  It also can increase your glucose if you have a history of diabetes, so it is important that you check your glucose frequently while you are on this medication.   Pain control:  Acetaminophen  (tylenol ) - You can take 2 extra strength tablets (1000 mg) every 6 hours as needed for pain/fever.

## 2023-11-02 NOTE — ED Notes (Signed)
 First Nurse Note: Pt to ED via [facility] from home for intermittent chest pain since yesterday. Per EMS pt reports that the pain is worse today. Pt reporting 7/10 chest pain. Per EMS pt has had two 81 mg Aspirin   Pulse- 65 SpO2- 100% BP- 153/88 CBG- 84 Temp- 98.1

## 2023-11-02 NOTE — ED Triage Notes (Signed)
 Pt to ED via [facility] from home. Pt reports intermittent CP since yesterday. Pt reports pain has been constant and worse today. Pain 7/10. Pt took 2 81mg  ASA PTA.

## 2023-11-02 NOTE — ED Provider Notes (Signed)
 [facility] Provider Note    Event Date/Time   First MD Initiated Contact with Patient 11/02/23 1706     (approximate)   History   Chest Pain   HPI  Sarah [name] is a 73 y.o. female presents to the emergency department chest pain.  States that she started having chest pain earlier today.  States that she has been evaluated by cardiology in the past for intermittent chest pain and had an increase of her isosorbide .  States that her chest pain has improved some since arriving to the emergency department.  Denies any significant shortness of breath.  No nausea or vomiting.  Denies any abdominal pain.  No history of DVT or PE.  Recently seen in the urgent care for sciatica and given IM Decadron .  States that she was also started on steroids.     Physical Exam   Triage Vital Signs: ED Triage Vitals [11/02/23 1509]  Encounter Vitals Group     BP 128/84     Systolic BP Percentile      Diastolic BP Percentile      Pulse Rate 65     Resp 20     Temp 98.1 F (36.7 C)     Temp Source Oral     SpO2 96 %     Weight      Height      Head Circumference      Peak Flow      Pain Score 7     Pain Loc      Pain Education      Exclude from Growth Chart     Most recent vital signs: Vitals:   11/02/23 1509 11/02/23 1804  BP: 128/84 121/72  Pulse: 65 63  Resp: 20 18  Temp: 98.1 F (36.7 C)   SpO2: 96% 98%    Physical Exam Constitutional:      Appearance: She is well-developed.  HENT:     Head: Atraumatic.  Eyes:     Conjunctiva/sclera: Conjunctivae normal.  Cardiovascular:     Rate and Rhythm: Regular rhythm.  Pulmonary:     Effort: No respiratory distress.  Abdominal:     General: There is no distension.  Musculoskeletal:        General: Normal range of motion.     Cervical back: Normal range of motion.  Skin:    General: Skin is warm.  Neurological:     Mental Status: She is alert. Mental status is at baseline.     IMPRESSION / MDM /  ASSESSMENT AND PLAN / ED COURSE  I reviewed the triage vital signs and the nursing notes.  Differential diagnosis including ACS, gastritis/PUD secondary to steroid use, pneumonia, pulmonary edema  EKG  I, Viviano Ground, the attending physician, personally viewed and interpreted this ECG.   Rate: Normal  Rhythm: Normal sinus  Axis: Normal  Intervals: Normal  ST&T Change: None.  T waves inverted to the septal leads which is unchanged when compared to prior  No tachycardic or bradycardic dysrhythmias while on cardiac telemetry.  RADIOLOGY I independently reviewed imaging, my interpretation of imaging: Chest x-ray no signs of pneumonia  LABS (all labs ordered are listed, but only abnormal results are displayed) Labs interpreted as -    Labs Reviewed  CBC - Abnormal; Notable for the following components:      Result Value   RBC 5.39 (*)    All other components within normal limits  BASIC METABOLIC PANEL WITH  GFR  TROPONIN I (HIGH SENSITIVITY)  TROPONIN I (HIGH SENSITIVITY)     MDM  Serial troponins are negative, low suspicion for ACS.  Discussed close follow-up as an outpatient with her cardiologist as she likely needs repeat stress testing.  Discussed return to the emergency department if she had any return of her chest pain.  Possibly gastritis in the setting of steroid use.  Given a dose of Pepcid  and will restart her Pepcid .  States that she has a history of gastritis.  No significant anemia.  Clinical picture is not consistent with dissection and have a low suspicion for pulmonary embolism, no shortness of breath and no pleuritic chest pain, no hypoxia, no tachycardia and no other risk factors.  No findings consistent with pericarditis.  Abdomen is nontender to palpation have low suspicion for referred pain from the abdomen.  Discussed close follow-up with primary care physician and given return precautions for any worsening symptoms.     PROCEDURES:  Critical Care  performed: No  Procedures  Patient's presentation is most consistent with acute presentation with potential threat to life or bodily function.   MEDICATIONS ORDERED IN ED: Medications  famotidine  (PEPCID ) tablet 20 mg (20 mg Oral Given 11/02/23 1857)    FINAL CLINICAL IMPRESSION(S) / ED DIAGNOSES   Final diagnoses:  Chest pain, unspecified type     Rx / DC Orders   ED Discharge Orders     None        Note:  This document was prepared using Dragon voice recognition software and may include unintentional dictation errors.   Viviano Ground, MD 11/03/23 [phone]

## 2023-11-08 ENCOUNTER — Emergency Department
Admission: EM | Admit: 2023-11-08 | Discharge: 2023-11-08 | Disposition: A | Discharge status: Home / Self Care | Attending: Emergency Medicine | Admitting: Emergency Medicine

## 2023-11-08 ENCOUNTER — Emergency Department

## 2023-11-08 ENCOUNTER — Other Ambulatory Visit: Payer: Self-pay

## 2023-11-08 DIAGNOSIS — R0789 Other chest pain: Secondary | ICD-10-CM | POA: Diagnosis not present

## 2023-11-08 DIAGNOSIS — J449 Chronic obstructive pulmonary disease, unspecified: Secondary | ICD-10-CM | POA: Diagnosis not present

## 2023-11-08 DIAGNOSIS — R079 Chest pain, unspecified: Secondary | ICD-10-CM | POA: Insufficient documentation

## 2023-11-08 DIAGNOSIS — I110 Hypertensive heart disease with heart failure: Secondary | ICD-10-CM | POA: Diagnosis not present

## 2023-11-08 DIAGNOSIS — I2510 Atherosclerotic heart disease of native coronary artery without angina pectoris: Secondary | ICD-10-CM | POA: Diagnosis not present

## 2023-11-08 DIAGNOSIS — E119 Type 2 diabetes mellitus without complications: Secondary | ICD-10-CM | POA: Diagnosis not present

## 2023-11-08 DIAGNOSIS — I5089 Other heart failure: Secondary | ICD-10-CM | POA: Diagnosis not present

## 2023-11-08 DIAGNOSIS — R0602 Shortness of breath: Secondary | ICD-10-CM | POA: Diagnosis not present

## 2023-11-08 LAB — CBC WITH DIFFERENTIAL/PLATELET
Abs Immature Granulocytes: 0.04 10*3/uL (ref 0.00–0.07)
Basophils Absolute: 0 10*3/uL (ref 0.0–0.1)
Basophils Relative: 0 %
Eosinophils Absolute: 0.2 10*3/uL (ref 0.0–0.5)
Eosinophils Relative: 2 %
HCT: 44.6 % (ref 36.0–46.0)
Hemoglobin: 14.1 g/dL (ref 12.0–15.0)
Immature Granulocytes: 0 %
Lymphocytes Relative: 33 %
Lymphs Abs: 3.1 10*3/uL (ref 0.7–4.0)
MCH: 26.1 pg (ref 26.0–34.0)
MCHC: 31.6 g/dL (ref 30.0–36.0)
MCV: 82.4 fL (ref 80.0–100.0)
Monocytes Absolute: 1.1 10*3/uL — ABNORMAL HIGH (ref 0.1–1.0)
Monocytes Relative: 12 %
Neutro Abs: 4.9 10*3/uL (ref 1.7–7.7)
Neutrophils Relative %: 53 %
Platelets: 240 10*3/uL (ref 150–400)
RBC: 5.41 MIL/uL — ABNORMAL HIGH (ref 3.87–5.11)
RDW: 15.6 % — ABNORMAL HIGH (ref 11.5–15.5)
WBC: 9.3 10*3/uL (ref 4.0–10.5)
nRBC: 0 % (ref 0.0–0.2)

## 2023-11-08 LAB — BASIC METABOLIC PANEL WITH GFR
Anion gap: 9 (ref 5–15)
BUN: 20 mg/dL (ref 8–23)
CO2: 29 mmol/L (ref 22–32)
Calcium: 9.3 mg/dL (ref 8.9–10.3)
Chloride: 99 mmol/L (ref 98–111)
Creatinine, Ser: 1.07 mg/dL — ABNORMAL HIGH (ref 0.44–1.00)
GFR, Estimated: 56 mL/min — ABNORMAL LOW (ref >60)
Glucose, Bld: 92 mg/dL (ref 70–99)
Potassium: 4 mmol/L (ref 3.5–5.1)
Sodium: 137 mmol/L (ref 135–145)

## 2023-11-08 LAB — TROPONIN I (HIGH SENSITIVITY)
Troponin I (High Sensitivity): 3 ng/L (ref <18)
Troponin I (High Sensitivity): 4 ng/L (ref <18)

## 2023-11-08 MED ORDER — RANOLAZINE ER 500 MG PO TB12: 1000.0000 mg | ORAL_TABLET | Freq: Two times a day (BID) | ORAL | 0 refills | Status: AC

## 2023-11-08 MED ORDER — ISOSORBIDE MONONITRATE ER 60 MG PO TB24: 60.0000 mg | ORAL_TABLET | Freq: Two times a day (BID) | ORAL | 0 refills | Status: AC

## 2023-11-08 MED ORDER — DILTIAZEM HCL ER COATED BEADS 120 MG PO CP24: 120.0000 mg | ORAL_CAPSULE | Freq: Every day | ORAL | 0 refills | Status: DC

## 2023-11-08 NOTE — ED Triage Notes (Signed)
 Pt brought in from home by EMS for intermittent CP x 1 week. Pt states she was seen for the same on Monday and given Pepcid ; it hasn't helped. Pt rates pain 5/10. 324 ASA given by EMS PTA.

## 2023-11-08 NOTE — ED Provider Notes (Signed)
 [facility] Provider Note    Event Date/Time   First MD Initiated Contact with Patient 11/08/23 1232     (approximate)   History   Chest Pain   HPI Sarah [name] is a 73 y.o. female with history of COPD, DM 2, CAD, HTN, HLD, HFpEF presenting today for chest pain.  Patient states she had an episode earlier today when she was getting up and doing things on exertion where she started having pain across the front of her chest which felt like a dull ache.  She did not have any associated shortness of breath, radiation of the pain, lightheadedness, nausea, sweating.  Symptoms have largely resolved by the time of arrival to the ED.  States she feels like she is having worsening frequency of her anginal symptoms.  Chart review: Patient seen in the ED 6 days ago for similar chest pain symptoms.  Had negative troponins and felt more to be gastritis at that time in the setting of recent steroid use.  Told to follow-up with her cardiology team.     Physical Exam   Triage Vital Signs: ED Triage Vitals  Encounter Vitals Group     BP      Systolic BP Percentile      Diastolic BP Percentile      Pulse      Resp      Temp      Temp src      SpO2      Weight      Height      Head Circumference      Peak Flow      Pain Score      Pain Loc      Pain Education      Exclude from Growth Chart     Most recent vital signs: Vitals:   11/08/23 1245  BP: 115/68  Pulse: 96  Resp: 17  Temp: 97.6 F (36.4 C)  SpO2: 96%    Physical Exam: I have reviewed the vital signs and nursing notes. General: Awake, alert, no acute distress.  Nontoxic appearing. Head:  Atraumatic, normocephalic.   ENT:  EOM intact, PERRL. Oral mucosa is pink and moist with no lesions. Neck: Neck is supple with full range of motion, No meningeal signs. Cardiovascular:  RRR, No murmurs. Peripheral pulses palpable and equal bilaterally. Respiratory:  Symmetrical chest wall expansion.  No  rhonchi, rales, or wheezes.  Good air movement throughout.  No use of accessory muscles.   Musculoskeletal:  No cyanosis or edema. Moving extremities with full ROM Abdomen:  Soft, nontender, nondistended. Neuro:  GCS 15, moving all four extremities, interacting appropriately. Speech clear. Psych:  Calm, appropriate.   Skin:  Warm, dry, no rash.    ED Results / Procedures / Treatments   Labs (all labs ordered are listed, but only abnormal results are displayed) Labs Reviewed  CBC WITH DIFFERENTIAL/PLATELET - Abnormal; Notable for the following components:      Result Value   RBC 5.41 (*)    RDW 15.6 (*)    Monocytes Absolute 1.1 (*)    All other components within normal limits  BASIC METABOLIC PANEL WITH GFR - Abnormal; Notable for the following components:   Creatinine, Ser 1.07 (*)    GFR, Estimated 56 (*)    All other components within normal limits  TROPONIN I (HIGH SENSITIVITY)  TROPONIN I (HIGH SENSITIVITY)     EKG My EKG interpretation: Rate of 62, normal sinus  rhythm, normal axis, normal intervals.  No acute ST elevations or depressions.  T wave inversions present in V2 which is consistent with her baseline.   RADIOLOGY Independently interpreted chest x-ray with no acute pathology   PROCEDURES:  Critical Care performed: No  Procedures   MEDICATIONS ORDERED IN ED: Medications - No data to display   IMPRESSION / MDM / ASSESSMENT AND PLAN / ED COURSE  I reviewed the triage vital signs and the nursing notes.                              Differential diagnosis includes, but is not limited to, ACS, anginal chest pain, pneumonia, gastritis, reflux  Patient's presentation is most consistent with acute complicated illness / injury requiring diagnostic workup.  Patient is a 73 year old female presenting today for chest pain with no associated symptoms.  Does have prior history of CAD and episode today seem to happen on exertion although this is her second visit in  6 days for similar symptoms.  EKG without any signs of ischemia.  Troponin negative x 2.  CBC and BMP otherwise unremarkable.  Chest x-ray negative.  Given repeat visit in 6 days, discussed with Dr. Beau Bound of cardiology.  He evaluated patient at the bedside and made adjustments to her medications including increasing her Imdur  to 60 mg twice daily, adding Ranexa  to 1000 mg twice daily and adding Cardizem  120 mg daily.  She will follow-up in the clinic with him in 2 days.  The patient is on the cardiac monitor to evaluate for evidence of arrhythmia and/or significant heart rate changes. Clinical Course as of 11/08/23 1550  Sun Nov 08, 2023  1504 Dr. Beau Bound - Will come to bedside to evaluate [DW]  1546 Increase imdur  to 60 BID. Ranexa  100 BID. Add cardizem  120 daily. Will see on Tuesday.  [DW]    Clinical Course User Index [DW] Kandee Orion, MD     FINAL CLINICAL IMPRESSION(S) / ED DIAGNOSES   Final diagnoses:  Chest pain, unspecified type     Rx / DC Orders   ED Discharge Orders          Ordered    isosorbide  mononitrate (IMDUR ) 60 MG 24 hr tablet  2 times daily        11/08/23 1550    ranolazine  (RANEXA ) 500 MG 12 hr tablet  2 times daily        11/08/23 1550    diltiazem  (CARDIZEM  CD) 120 MG 24 hr capsule  Daily        11/08/23 1550             Note:  This document was prepared using Dragon voice recognition software and may include unintentional dictation errors.   Kandee Orion, MD 11/08/23 [phone]

## 2023-11-08 NOTE — Discharge Instructions (Addendum)
 The cardiology team who saw you today would like you to increase your Imdur  to 60 mg twice daily and your Ranexa to 1000 mg twice daily.  They would also like to add on Cardizem daily to your regimen and we will see you on Tuesday for repeat evaluation.  Please follow-up with them as planned.  Increase imdur  to 60 BID. Ranexa 1000 BID. Add cardizem 120 daily. Will see on Tuesday.

## 2023-11-10 DIAGNOSIS — R0789 Other chest pain: Secondary | ICD-10-CM | POA: Diagnosis not present

## 2023-11-10 DIAGNOSIS — I10 Essential (primary) hypertension: Secondary | ICD-10-CM | POA: Diagnosis not present

## 2023-11-10 DIAGNOSIS — I2510 Atherosclerotic heart disease of native coronary artery without angina pectoris: Secondary | ICD-10-CM | POA: Diagnosis not present

## 2023-11-10 DIAGNOSIS — E782 Mixed hyperlipidemia: Secondary | ICD-10-CM | POA: Diagnosis not present

## 2023-11-10 DIAGNOSIS — I2584 Coronary atherosclerosis due to calcified coronary lesion: Secondary | ICD-10-CM | POA: Diagnosis not present

## 2023-11-10 DIAGNOSIS — I5032 Chronic diastolic (congestive) heart failure: Secondary | ICD-10-CM | POA: Diagnosis not present

## 2023-11-10 DIAGNOSIS — E66813 Obesity, class 3: Secondary | ICD-10-CM | POA: Diagnosis not present

## 2023-11-11 ENCOUNTER — Other Ambulatory Visit: Payer: Self-pay | Admitting: Cardiology

## 2023-11-11 ENCOUNTER — Other Ambulatory Visit: Payer: Self-pay

## 2023-11-11 DIAGNOSIS — I10 Essential (primary) hypertension: Secondary | ICD-10-CM

## 2023-11-11 DIAGNOSIS — J449 Chronic obstructive pulmonary disease, unspecified: Secondary | ICD-10-CM

## 2023-11-11 DIAGNOSIS — R0789 Other chest pain: Secondary | ICD-10-CM

## 2023-11-11 DIAGNOSIS — E66813 Obesity, class 3: Secondary | ICD-10-CM

## 2023-11-11 DIAGNOSIS — E782 Mixed hyperlipidemia: Secondary | ICD-10-CM

## 2023-11-11 DIAGNOSIS — I2510 Atherosclerotic heart disease of native coronary artery without angina pectoris: Secondary | ICD-10-CM

## 2023-11-12 ENCOUNTER — Ambulatory Visit (INDEPENDENT_AMBULATORY_CARE_PROVIDER_SITE_OTHER): Admitting: Pulmonary Disease

## 2023-11-12 DIAGNOSIS — J449 Chronic obstructive pulmonary disease, unspecified: Secondary | ICD-10-CM | POA: Diagnosis not present

## 2023-11-12 LAB — PULMONARY FUNCTION TEST
DL/VA % pred: 106 %
DL/VA: 4.45 ml/min/mmHg/L
DLCO cor % pred: 96 %
DLCO cor: 17.88 ml/min/mmHg
DLCO unc % pred: 98 %
DLCO unc: 18.26 ml/min/mmHg
FEF 25-75 Post: 1.88 L/s
FEF 25-75 Pre: 2.48 L/s
FEF2575-%Change-Post: -24 %
FEF2575-%Pred-Post: 104 %
FEF2575-%Pred-Pre: 138 %
FEV1-%Change-Post: -4 %
FEV1-%Pred-Post: 89 %
FEV1-%Pred-Pre: 93 %
FEV1-Post: 1.91 L
FEV1-Pre: 2 L
FEV1FVC-%Change-Post: 1 %
FEV1FVC-%Pred-Pre: 108 %
FEV6-%Change-Post: -6 %
FEV6-%Pred-Post: 83 %
FEV6-%Pred-Pre: 89 %
FEV6-Post: 2.26 L
FEV6-Pre: 2.42 L
FEV6FVC-%Pred-Post: 104 %
FEV6FVC-%Pred-Pre: 104 %
FVC-%Change-Post: -6 %
FVC-%Pred-Post: 80 %
FVC-%Pred-Pre: 85 %
FVC-Post: 2.29 L
FVC-Pre: 2.44 L
Post FEV1/FVC ratio: 83 %
Post FEV6/FVC ratio: 100 %
Pre FEV1/FVC ratio: 82 %
Pre FEV6/FVC Ratio: 100 %
RV % pred: 102 %
RV: 2.22 L
TLC % pred: 94 %
TLC: 4.65 L

## 2023-11-12 NOTE — Patient Instructions (Signed)
 Full PFT completed today ? ?

## 2023-11-12 NOTE — Progress Notes (Signed)
 Full PFT completed today ? ?

## 2023-11-19 ENCOUNTER — Encounter: Payer: Self-pay | Admitting: Pulmonary Disease

## 2023-11-19 ENCOUNTER — Ambulatory Visit: Payer: Self-pay | Admitting: Pulmonary Disease

## 2023-11-19 ENCOUNTER — Ambulatory Visit: Payer: PRIVATE HEALTH INSURANCE | Admitting: Pulmonary Disease

## 2023-11-19 VITALS — BP 124/60 | HR 65 | Temp 97.1°F | Ht 63.0 in | Wt 244.2 lb

## 2023-11-19 DIAGNOSIS — I209 Angina pectoris, unspecified: Secondary | ICD-10-CM | POA: Diagnosis not present

## 2023-11-19 DIAGNOSIS — Z6841 Body Mass Index (BMI) 40.0 and over, adult: Secondary | ICD-10-CM

## 2023-11-19 DIAGNOSIS — I5042 Chronic combined systolic (congestive) and diastolic (congestive) heart failure: Secondary | ICD-10-CM | POA: Diagnosis not present

## 2023-11-19 DIAGNOSIS — J449 Chronic obstructive pulmonary disease, unspecified: Secondary | ICD-10-CM | POA: Diagnosis not present

## 2023-11-19 DIAGNOSIS — Z87891 Personal history of nicotine dependence: Secondary | ICD-10-CM

## 2023-11-19 NOTE — Progress Notes (Unsigned)
 Subjective:    Patient ID: Sarah [name], female    DOB: Oct 17, 1950, 73 y.o.   MRN: [id number]  Patient Care Team: Pete Brand, DO as PCP - General (Family Medicine) Annelle Kiel, MD as Consulting Physician (Internal Medicine) Marquita Situ, Magali Schmitz, MD (General Surgery) Marc Senior, MD as Consulting Physician (Pulmonary Disease)  Chief Complaint  Patient presents with   Follow-up    No SOB, wheezing or cough.    BACKGROUND/INTERVAL: 73 year old female, former smoker (13.5 PY), who follows here for the issue of moderate COPD and dyspnea on exertion.  This is a scheduled visit.  Patient was last seen by me on 19 March 2023.  Continues to be on Spiriva  2.5 mcg, 2 inhalations daily. She has not had any COPD exacerbations nor hospital admissions since her last visit.  She also has combined systolic and diastolic heart failure and follows up with cardiology in this regard.  No decompensation of this issue.  PFTs performed 12 Nov 2023 were essentially normal.  She had sleep study performed 07 April 2023 that showed very mild OSA with AHI of 6.7/h, she is not on CPAP.   HPI Discussed the use of AI scribe software for clinical note transcription with the patient, who gave verbal consent to proceed.  History of Present Illness   Sarah [name] is a 73 year old female with mild COPD who presents with chest pain.  She has been experiencing chest pain that began after receiving treatment for leg pain diagnosed as sciatica. The treatment included a cortisone shot, baclofen , and prednisone . Following this, she developed chest pain and visited the emergency room twice in one week. Despite negative tests for acute cardiac events, her medication was adjusted, including an increase in isosorbide  and the addition of Ranexa , which has improved her symptoms.  She is to have further cardiac studies ordered by her cardiologist to investigate this issue.  She describes the chest pain as a 'pressure'  sensation. During episodes of chest pain, she took three 81 mg aspirin  and prednisone , and chewed Pepto-Bismol, but did not find relief from these measures.  As noted, her pain has now improved with the interventions that Dr. Beau Bound, her cardiologist, instituted.  Her breathing has been stable, and she uses Spiriva  regularly with occasional use of albuterol , particularly during activities around the house. No cough and her breathing has been good.  She has not had any worsening symptoms of sleep apnea and notes that she is rested when she wakes up.     DATA: 04/29/2016 PFTs: FEV1 1.59 L or 82% predicted, FVC 2.39 L or 98% predicted, FEV1/FVC 67%, diffusion capacity 70%.  Consistent with mild obstruction, concomitant restriction likely due to obesity. 03/17/2019 CT chest: No parenchymal abnormalities, specifically no lung nodule.  No emphysema. 04/08/2021 chest x-ray PA and lateral: Diffuse right lung airspace disease consistent with multifocal pneumonia. 04/09/2021 echocardiogram: LVEF 45 to 50% with mild decreased function in the LV.  No wall motion abnormalities.  Diastolic dysfunction grade 1.  Mild mitral regurgitation. 05/08/2021 chest x-ray PA lateral: No acute cardiopulmonary disease, pneumonia resolved. 06/25/2021 PFTs: FEV1 1.78 L or 100% predicted, FVC 2.37 L or 103% of predicted.  FEV1/FVC 75% lung volumes normal, no bronchodilator response diffusion capacity 82%, low end of normal.  Compared to PFTs performed November 2017 there has been improvement. 10/09/2022 echocardiogram: LVEF 60 to 65%, grade 1 diastolic dysfunction, normal right ventricular function.  Mild mitral valve regurgitation, no aortic stenosis present. 04/07/2023 Home sleep study: Very  mild sleep apnea with minimal to no oxygen saturations during sleep.  AHI of 6.7/h.  Being treated with lifestyle modifications. 11/12/2023 PFTs: FEV1 2.0 L or 93% predicted, FVC 2.44 L or 85% predicted, FEV1/FVC 82%, lung volumes are  normal, diffusion capacity normal, no bronchodilator response.  Normal study.  Review of Systems A 10 point review of systems was performed and it is as noted above otherwise negative.   Patient Active Problem List   Diagnosis Date Noted   Colon cancer screening    Impaired fasting glucose    Acute pain of right shoulder    Acute on chronic combined systolic and diastolic CHF (congestive heart failure) (HCC) 04/08/2021   Diabetes mellitus without complication (HCC)    COPD (chronic obstructive pulmonary disease) (HCC)    HLD (hyperlipidemia)    CAD (coronary artery disease)    Chest pain    Osteoarthritis of knee 07/09/2017   Patellofemoral stress syndrome 07/09/2017   Venous insufficiency of both lower extremities 02/11/2017   Chronic diastolic CHF (congestive heart failure), NYHA class 2 (HCC) 05/12/2016   Controlled type 2 diabetes mellitus without complication, without long-term current use of insulin (HCC) 05/12/2016   Essential hypertension 04/04/2016   Mixed hyperlipidemia 04/04/2016   Other diseases of stomach and duodenum    Problems with swallowing and mastication    Left buttock abscess 12/15/2014   DOE (dyspnea on exertion) 06/05/2014   COPD type A (HCC) 06/05/2014   Morbid obesity with BMI of 40.0-44.9, adult (HCC) 06/05/2014    Social History   Tobacco Use   Smoking status: Former    Current packs/day: 0.00    Average packs/day: 0.5 packs/day for 27.0 years (13.5 ttl pk-yrs)    Types: Cigarettes    Start date: 01/14/1969    Quit date: 01/15/1996    Years since quitting: 27.8   Smokeless tobacco: Never  Substance Use Topics   Alcohol use: Yes    Alcohol/week: 2.0 - 3.0 standard drinks of alcohol    Types: 1 - 2 Glasses of wine, 1 Cans of beer per week    Comment: WINE WEEKLY    No Known Allergies  Current Meds  Medication Sig   acetaminophen  (TYLENOL ) 500 MG tablet Take 500 mg by mouth every 6 (six) hours as needed for moderate pain.   albuterol   (VENTOLIN  HFA) 108 (90 Base) MCG/ACT inhaler Inhale 1-2 puffs into the lungs every 6 (six) hours as needed for wheezing or shortness of breath.   ascorbic acid  (VITAMIN C) 1000 MG tablet Take 1,000 mg by mouth daily.   aspirin  81 MG tablet Take 81 mg by mouth daily.   carvedilol  (COREG ) 12.5 MG tablet Take 12.5 mg by mouth 2 (two) times daily with a meal.   Cholecalciferol (VITAMIN D) 50 MCG (2000 UT) CAPS Take 2,000 Units by mouth daily.   diltiazem  (CARDIZEM  CD) 120 MG 24 hr capsule Take 1 capsule (120 mg total) by mouth daily.   furosemide  (LASIX ) 40 MG tablet Take 20 mg by mouth daily.   isosorbide  mononitrate (IMDUR ) 60 MG 24 hr tablet Take 1 tablet (60 mg total) by mouth in the morning and at bedtime.   JARDIANCE  10 MG TABS tablet TAKE 1 TABLET BY MOUTH EVERY DAY BEFORE BREAKFAST   Menthol-Methyl Salicylate (SALONPAS PAIN RELIEF PATCH) PTCH Place 1 patch onto the skin daily as needed (pain).   Multiple Vitamins-Minerals (CENTRUM SILVER PO) Take 1 tablet by mouth daily.   ranolazine  (RANEXA ) 500 MG 12 hr  tablet Take 2 tablets (1,000 mg total) by mouth 2 (two) times daily.   simvastatin  (ZOCOR ) 40 MG tablet Take 40 mg by mouth at bedtime.   spironolactone  (ALDACTONE ) 25 MG tablet Take 0.5 tablets (12.5 mg total) by mouth daily.   Tiotropium Bromide  Monohydrate (SPIRIVA  RESPIMAT) 2.5 MCG/ACT AERS INHALE 2 PUFFS BY MOUTH INTO THE LUNGS DAILY   valsartan (DIOVAN) 80 MG tablet Take 40 mg by mouth daily.    Immunization History  Administered Date(s) Administered   Fluad Quad(high Dose 65+) 05/08/2021, 04/11/2022   Influenza, High Dose Seasonal PF 04/09/2018, 04/16/2023   Influenza, Quadrivalent, Recombinant, Inj, Pf 04/18/2019, 04/30/2020   Influenza-Unspecified 03/02/2014, 04/18/2019   Moderna Sars-Covid-2 Vaccination 07/07/2019, 07/29/2019, 08/29/2019   PNEUMOCOCCAL CONJUGATE-20 01/30/2021   PPD Test 08/19/2019, 09/21/2020, 09/09/2021, 09/19/2022, 10/27/2023   Pneumococcal  Polysaccharide-23 05/17/2015        Objective:     BP 124/60 (BP Location: Right Arm, Cuff Size: Large)   Pulse 65   Temp (!) 97.1 F (36.2 C)   Ht 5\' 3"  (1.6 m)   Wt 244 lb 3.2 oz (110.8 kg)   SpO2 96%   BMI 43.26 kg/m   SpO2: 96 % O2 Device: None (Room air)  GENERAL: Obese woman, no acute distress, fully ambulatory.  No conversational dyspnea. HEAD: Normocephalic, atraumatic.  EYES: Pupils equal, round, reactive to light.  No scleral icterus.  MOUTH: Dentition is full.  Oral mucosa moist.  No thrush. NECK: Supple. No thyromegaly. Trachea midline. No JVD.  No adenopathy. PULMONARY: Good air entry bilaterally.  No adventitious sounds. CARDIOVASCULAR: S1 and S2. Regular rate and rhythm.  No rubs, murmurs or gallops heard. ABDOMEN: Obese otherwise benign. MUSCULOSKELETAL: No joint deformity, no clubbing, no edema.  NEUROLOGIC: No overt focal deficit, no gait disturbance, speech is fluent. SKIN: Intact,warm,dry. PSYCH: Mood and behavior normal.  Discussed results of PFTs with patient.      Assessment & Plan:     ICD-10-CM   1. Stage 1 mild COPD by GOLD classification (HCC)  J44.9     2. Chronic combined systolic and diastolic CHF, NYHA class 2 (HCC)  I50.42     3. Angina pectoris (HCC)  I20.9     4. Morbid obesity with BMI of 40.0-44.9, adult (HCC)  E66.01    Z68.41      Discussion:    Mild COPD Mild COPD with well-controlled symptoms using Spiriva  and occasional albuterol . No recent cough or significant respiratory issues. - Continue Spiriva  - Provide contact number for Spiriva  assistance program - Follow up in six months  Chest pain/angina Intermittent chest pain with pressure sensation, unrelated to reflux or heartburn, occurring after initiation of prednisone  and baclofen  for sciatica. Emergency room visits yielded negative tests. Adjustments to isosorbide  and initiation of Ranexa  have improved symptoms. CT scan of the coronaries is planned for further  evaluation. - Patient is scheduled for CT scan of the coronaries per cardiology - Continue Ranexa  and adjusted isosorbide   Sciatica Sciatica diagnosed by urgent care, treated with cortisone shot, baclofen , and prednisone . Leg pain and discomfort preceded chest pain. - Continue current management for sciatica      Advised if symptoms do not improve or worsen, to please contact office for sooner follow up or seek emergency care.    I spent 32 minutes of dedicated to the care of this patient on the date of this encounter to include pre-visit review of records, face-to-face time with the patient discussing conditions above, post visit ordering of  testing, clinical documentation with the electronic health record, making appropriate referrals as documented, and communicating necessary findings to members of the patients care team.     C. Chloe Counter, MD Advanced Bronchoscopy PCCM [facility] [place]    *This note was generated using voice recognition software/Dragon and/or AI transcription program.  Despite best efforts to proofread, errors can occur which can change the meaning. Any transcriptional errors that result from this process are unintentional and may not be fully corrected at the time of dictation.

## 2023-11-19 NOTE — Patient Instructions (Signed)
 VISIT SUMMARY:  Today, we discussed your recent chest pain and reviewed your COPD and sciatica management. Your chest pain began after treatment for sciatica and has improved with medication adjustments. Your COPD symptoms are well-controlled, and we will continue your current treatment. We also planned further evaluation for your chest pain.  YOUR PLAN:  -MILD COPD: Chronic Obstructive Pulmonary Disease (COPD) is a lung condition that makes it hard to breathe. Your symptoms are well-controlled with Spiriva  and occasional use of albuterol . Continue using Spiriva  daily, and you can use albuterol  as needed. We will follow up in six months to monitor your condition.  -CHEST PAIN: Your chest pain, described as a pressure sensation, started after you began treatment for sciatica. Tests in the emergency room did not show any heart problems. Adjustments to your medication, including an increase in isosorbide  and the addition of Ranexa , have helped improve your symptoms.  Dr. Beau Bound is ordering a CT scan of your heart to further evaluate the cause of your chest pain. Continue taking Ranexa  and the adjusted dose of isosorbide .  INSTRUCTIONS:  Please schedule a CT scan of your heart as soon as possible. Continue taking your medications as prescribed. We will follow up in six months to monitor your COPD. If you experience any new or worsening symptoms, please contact our office immediately.

## 2023-11-20 ENCOUNTER — Encounter: Payer: Self-pay | Admitting: Pulmonary Disease

## 2023-12-01 DIAGNOSIS — I2510 Atherosclerotic heart disease of native coronary artery without angina pectoris: Secondary | ICD-10-CM | POA: Diagnosis not present

## 2023-12-01 DIAGNOSIS — E7800 Pure hypercholesterolemia, unspecified: Secondary | ICD-10-CM | POA: Diagnosis not present

## 2023-12-01 DIAGNOSIS — I10 Essential (primary) hypertension: Secondary | ICD-10-CM | POA: Diagnosis not present

## 2023-12-01 DIAGNOSIS — R7309 Other abnormal glucose: Secondary | ICD-10-CM | POA: Diagnosis not present

## 2023-12-01 DIAGNOSIS — I5032 Chronic diastolic (congestive) heart failure: Secondary | ICD-10-CM | POA: Diagnosis not present

## 2023-12-02 ENCOUNTER — Telehealth (HOSPITAL_COMMUNITY): Payer: Self-pay | Admitting: Emergency Medicine

## 2023-12-02 ENCOUNTER — Encounter (HOSPITAL_COMMUNITY): Payer: Self-pay

## 2023-12-02 DIAGNOSIS — R0789 Other chest pain: Secondary | ICD-10-CM | POA: Diagnosis not present

## 2023-12-02 DIAGNOSIS — E66813 Obesity, class 3: Secondary | ICD-10-CM | POA: Diagnosis not present

## 2023-12-02 DIAGNOSIS — I2510 Atherosclerotic heart disease of native coronary artery without angina pectoris: Secondary | ICD-10-CM | POA: Diagnosis not present

## 2023-12-02 DIAGNOSIS — I5032 Chronic diastolic (congestive) heart failure: Secondary | ICD-10-CM | POA: Diagnosis not present

## 2023-12-02 DIAGNOSIS — I10 Essential (primary) hypertension: Secondary | ICD-10-CM | POA: Diagnosis not present

## 2023-12-02 DIAGNOSIS — E782 Mixed hyperlipidemia: Secondary | ICD-10-CM | POA: Diagnosis not present

## 2023-12-02 NOTE — Telephone Encounter (Signed)
 Attempted to call patient regarding upcoming cardiac CT appointment. Left message on voicemail with name and callback number Rockwell Alexandria RN Navigator Cardiac Imaging [facility] [phone] Office [phone] Cell

## 2023-12-03 ENCOUNTER — Ambulatory Visit
Admission: RE | Admit: 2023-12-03 | Discharge: 2023-12-03 | Disposition: A | Discharge status: Home / Self Care | Source: Ambulatory Visit | Attending: Cardiology | Admitting: Cardiology

## 2023-12-03 ENCOUNTER — Other Ambulatory Visit: Payer: Self-pay | Admitting: Internal Medicine

## 2023-12-03 ENCOUNTER — Ambulatory Visit
Admission: RE | Admit: 2023-12-03 | Discharge: 2023-12-03 | Disposition: A | Discharge status: Home / Self Care | Source: Ambulatory Visit | Attending: Internal Medicine | Admitting: Internal Medicine

## 2023-12-03 DIAGNOSIS — I25118 Atherosclerotic heart disease of native coronary artery with other forms of angina pectoris: Secondary | ICD-10-CM

## 2023-12-03 DIAGNOSIS — R931 Abnormal findings on diagnostic imaging of heart and coronary circulation: Secondary | ICD-10-CM | POA: Diagnosis not present

## 2023-12-03 DIAGNOSIS — E782 Mixed hyperlipidemia: Secondary | ICD-10-CM | POA: Diagnosis not present

## 2023-12-03 DIAGNOSIS — I2584 Coronary atherosclerosis due to calcified coronary lesion: Secondary | ICD-10-CM | POA: Diagnosis not present

## 2023-12-03 DIAGNOSIS — R0789 Other chest pain: Secondary | ICD-10-CM | POA: Diagnosis not present

## 2023-12-03 DIAGNOSIS — E66813 Obesity, class 3: Secondary | ICD-10-CM | POA: Diagnosis not present

## 2023-12-03 DIAGNOSIS — I10 Essential (primary) hypertension: Secondary | ICD-10-CM | POA: Insufficient documentation

## 2023-12-03 DIAGNOSIS — I2510 Atherosclerotic heart disease of native coronary artery without angina pectoris: Secondary | ICD-10-CM | POA: Insufficient documentation

## 2023-12-03 MED ORDER — NITROGLYCERIN 0.4 MG SL SUBL: 0.4000 mg | SUBLINGUAL_TABLET | Freq: Once | SUBLINGUAL | Status: DC

## 2023-12-03 MED ORDER — IOHEXOL 350 MG/ML SOLN
80.0000 mL | Freq: Once | INTRAVENOUS | Status: AC | PRN
  Administered 2023-12-03: 80 mL via INTRAVENOUS

## 2023-12-03 MED ORDER — SODIUM CHLORIDE 0.9 % IV BOLUS: 500.0000 mL | Freq: Once | INTRAVENOUS | Status: DC

## 2023-12-03 MED ORDER — NITROGLYCERIN 0.4 MG SL SUBL
0.8000 mg | SUBLINGUAL_TABLET | Freq: Once | SUBLINGUAL | Status: AC
  Administered 2023-12-03: 0.8 mg via SUBLINGUAL
  Filled 2023-12-03: qty 25

## 2023-12-03 NOTE — Progress Notes (Signed)
 CT-FFR orders for ostial RCA disease (FFR-Negative).

## 2023-12-03 NOTE — Progress Notes (Signed)
Patient tolerated procedure well. W/C to lobby.  Ambulate w/o difficulty. Denies light headedness or being dizzy. Encouraged to drink extra water today and reasoning explained. Verbalized understanding. All questions answered. ABC intact. No further needs. Discharge from procedure area w/o issues.

## 2023-12-05 ENCOUNTER — Other Ambulatory Visit: Payer: Self-pay | Admitting: Pulmonary Disease

## 2023-12-15 DIAGNOSIS — K2970 Gastritis, unspecified, without bleeding: Secondary | ICD-10-CM | POA: Diagnosis not present

## 2023-12-15 DIAGNOSIS — I2510 Atherosclerotic heart disease of native coronary artery without angina pectoris: Secondary | ICD-10-CM | POA: Diagnosis not present

## 2023-12-15 DIAGNOSIS — R079 Chest pain, unspecified: Secondary | ICD-10-CM | POA: Diagnosis not present

## 2023-12-15 DIAGNOSIS — R7309 Other abnormal glucose: Secondary | ICD-10-CM | POA: Diagnosis not present

## 2023-12-15 DIAGNOSIS — M47816 Spondylosis without myelopathy or radiculopathy, lumbar region: Secondary | ICD-10-CM | POA: Diagnosis not present

## 2023-12-15 DIAGNOSIS — Z87442 Personal history of urinary calculi: Secondary | ICD-10-CM | POA: Diagnosis not present

## 2023-12-15 DIAGNOSIS — I5032 Chronic diastolic (congestive) heart failure: Secondary | ICD-10-CM | POA: Diagnosis not present

## 2023-12-15 DIAGNOSIS — I10 Essential (primary) hypertension: Secondary | ICD-10-CM | POA: Diagnosis not present

## 2023-12-15 DIAGNOSIS — M1710 Unilateral primary osteoarthritis, unspecified knee: Secondary | ICD-10-CM | POA: Diagnosis not present

## 2023-12-15 DIAGNOSIS — E7800 Pure hypercholesterolemia, unspecified: Secondary | ICD-10-CM | POA: Diagnosis not present

## 2023-12-15 DIAGNOSIS — M79605 Pain in left leg: Secondary | ICD-10-CM | POA: Diagnosis not present

## 2024-01-18 DIAGNOSIS — M5459 Other low back pain: Secondary | ICD-10-CM | POA: Diagnosis not present

## 2024-01-18 DIAGNOSIS — M545 Low back pain, unspecified: Secondary | ICD-10-CM | POA: Diagnosis not present

## 2024-01-18 DIAGNOSIS — G8929 Other chronic pain: Secondary | ICD-10-CM | POA: Diagnosis not present

## 2024-01-18 DIAGNOSIS — M25552 Pain in left hip: Secondary | ICD-10-CM | POA: Diagnosis not present

## 2024-01-18 DIAGNOSIS — M25551 Pain in right hip: Secondary | ICD-10-CM | POA: Diagnosis not present

## 2024-01-21 ENCOUNTER — Other Ambulatory Visit: Payer: Self-pay | Admitting: Family Medicine

## 2024-01-21 DIAGNOSIS — Z1231 Encounter for screening mammogram for malignant neoplasm of breast: Secondary | ICD-10-CM

## 2024-01-28 DIAGNOSIS — M7061 Trochanteric bursitis, right hip: Secondary | ICD-10-CM | POA: Diagnosis not present

## 2024-01-28 DIAGNOSIS — M7062 Trochanteric bursitis, left hip: Secondary | ICD-10-CM | POA: Diagnosis not present

## 2024-02-11 ENCOUNTER — Other Ambulatory Visit: Payer: Self-pay | Admitting: Family

## 2024-02-16 DIAGNOSIS — M25551 Pain in right hip: Secondary | ICD-10-CM | POA: Diagnosis not present

## 2024-02-16 DIAGNOSIS — M5459 Other low back pain: Secondary | ICD-10-CM | POA: Diagnosis not present

## 2024-02-16 DIAGNOSIS — G8929 Other chronic pain: Secondary | ICD-10-CM | POA: Diagnosis not present

## 2024-02-16 DIAGNOSIS — M25552 Pain in left hip: Secondary | ICD-10-CM | POA: Diagnosis not present

## 2024-02-29 ENCOUNTER — Ambulatory Visit
Admission: RE | Admit: 2024-02-29 | Discharge: 2024-02-29 | Disposition: A | Discharge status: Home / Self Care | Source: Ambulatory Visit | Attending: Family Medicine | Admitting: Family Medicine

## 2024-02-29 DIAGNOSIS — Z1231 Encounter for screening mammogram for malignant neoplasm of breast: Secondary | ICD-10-CM | POA: Diagnosis not present

## 2024-03-07 DIAGNOSIS — R079 Chest pain, unspecified: Secondary | ICD-10-CM | POA: Diagnosis not present

## 2024-03-07 DIAGNOSIS — I110 Hypertensive heart disease with heart failure: Secondary | ICD-10-CM | POA: Diagnosis not present

## 2024-03-07 DIAGNOSIS — I2510 Atherosclerotic heart disease of native coronary artery without angina pectoris: Secondary | ICD-10-CM | POA: Diagnosis not present

## 2024-03-07 DIAGNOSIS — R9431 Abnormal electrocardiogram [ECG] [EKG]: Secondary | ICD-10-CM | POA: Diagnosis not present

## 2024-03-07 DIAGNOSIS — J449 Chronic obstructive pulmonary disease, unspecified: Secondary | ICD-10-CM | POA: Diagnosis not present

## 2024-03-07 DIAGNOSIS — I509 Heart failure, unspecified: Secondary | ICD-10-CM | POA: Diagnosis not present

## 2024-03-07 DIAGNOSIS — Z87891 Personal history of nicotine dependence: Secondary | ICD-10-CM | POA: Diagnosis not present

## 2024-03-07 DIAGNOSIS — R918 Other nonspecific abnormal finding of lung field: Secondary | ICD-10-CM | POA: Diagnosis not present

## 2024-03-07 DIAGNOSIS — E785 Hyperlipidemia, unspecified: Secondary | ICD-10-CM | POA: Diagnosis not present

## 2024-03-07 DIAGNOSIS — E119 Type 2 diabetes mellitus without complications: Secondary | ICD-10-CM | POA: Diagnosis not present

## 2024-03-08 DIAGNOSIS — R079 Chest pain, unspecified: Secondary | ICD-10-CM | POA: Diagnosis not present

## 2024-03-21 DIAGNOSIS — E66813 Obesity, class 3: Secondary | ICD-10-CM | POA: Diagnosis not present

## 2024-03-21 DIAGNOSIS — I2510 Atherosclerotic heart disease of native coronary artery without angina pectoris: Secondary | ICD-10-CM | POA: Diagnosis not present

## 2024-03-21 DIAGNOSIS — R0789 Other chest pain: Secondary | ICD-10-CM | POA: Diagnosis not present

## 2024-03-21 DIAGNOSIS — E782 Mixed hyperlipidemia: Secondary | ICD-10-CM | POA: Diagnosis not present

## 2024-03-21 DIAGNOSIS — I10 Essential (primary) hypertension: Secondary | ICD-10-CM | POA: Diagnosis not present

## 2024-03-21 DIAGNOSIS — I2584 Coronary atherosclerosis due to calcified coronary lesion: Secondary | ICD-10-CM | POA: Diagnosis not present

## 2024-03-21 DIAGNOSIS — I5032 Chronic diastolic (congestive) heart failure: Secondary | ICD-10-CM | POA: Diagnosis not present

## 2024-04-02 ENCOUNTER — Other Ambulatory Visit: Payer: Self-pay | Admitting: Pulmonary Disease

## 2024-04-04 DIAGNOSIS — Q141 Congenital malformation of retina: Secondary | ICD-10-CM | POA: Diagnosis not present

## 2024-04-04 DIAGNOSIS — H35033 Hypertensive retinopathy, bilateral: Secondary | ICD-10-CM | POA: Diagnosis not present

## 2024-04-04 DIAGNOSIS — H26493 Other secondary cataract, bilateral: Secondary | ICD-10-CM | POA: Diagnosis not present

## 2024-04-04 DIAGNOSIS — H524 Presbyopia: Secondary | ICD-10-CM | POA: Diagnosis not present

## 2024-04-04 DIAGNOSIS — E119 Type 2 diabetes mellitus without complications: Secondary | ICD-10-CM | POA: Diagnosis not present

## 2024-04-04 DIAGNOSIS — I10 Essential (primary) hypertension: Secondary | ICD-10-CM | POA: Diagnosis not present

## 2024-04-05 DIAGNOSIS — I10 Essential (primary) hypertension: Secondary | ICD-10-CM | POA: Diagnosis not present

## 2024-04-05 DIAGNOSIS — R079 Chest pain, unspecified: Secondary | ICD-10-CM | POA: Diagnosis not present

## 2024-04-05 DIAGNOSIS — Z6841 Body Mass Index (BMI) 40.0 and over, adult: Secondary | ICD-10-CM | POA: Diagnosis not present

## 2024-04-05 DIAGNOSIS — E782 Mixed hyperlipidemia: Secondary | ICD-10-CM | POA: Diagnosis not present

## 2024-04-05 DIAGNOSIS — E66813 Obesity, class 3: Secondary | ICD-10-CM | POA: Diagnosis not present

## 2024-04-05 DIAGNOSIS — R0789 Other chest pain: Secondary | ICD-10-CM | POA: Diagnosis not present

## 2024-04-05 DIAGNOSIS — I5032 Chronic diastolic (congestive) heart failure: Secondary | ICD-10-CM | POA: Diagnosis not present

## 2024-04-20 DIAGNOSIS — M25552 Pain in left hip: Secondary | ICD-10-CM | POA: Diagnosis not present

## 2024-04-20 DIAGNOSIS — M5459 Other low back pain: Secondary | ICD-10-CM | POA: Diagnosis not present

## 2024-04-20 DIAGNOSIS — G8929 Other chronic pain: Secondary | ICD-10-CM | POA: Diagnosis not present

## 2024-04-20 DIAGNOSIS — M25551 Pain in right hip: Secondary | ICD-10-CM | POA: Diagnosis not present

## 2024-04-25 DIAGNOSIS — K219 Gastro-esophageal reflux disease without esophagitis: Secondary | ICD-10-CM | POA: Diagnosis not present

## 2024-04-25 DIAGNOSIS — R0789 Other chest pain: Secondary | ICD-10-CM | POA: Diagnosis not present

## 2024-05-05 ENCOUNTER — Encounter: Payer: Self-pay | Admitting: *Deleted

## 2024-05-09 ENCOUNTER — Encounter: Payer: Self-pay | Admitting: *Deleted

## 2024-05-16 ENCOUNTER — Ambulatory Visit
Admission: RE | Admit: 2024-05-16 | Discharge: 2024-05-16 | Disposition: A | Discharge status: Home / Self Care | Attending: Gastroenterology | Admitting: Gastroenterology

## 2024-05-16 ENCOUNTER — Ambulatory Visit: Admitting: Anesthesiology

## 2024-05-16 ENCOUNTER — Encounter
Admission: RE | Disposition: A | Discharge status: Home / Self Care | Payer: Self-pay | Source: Home / Self Care | Attending: Gastroenterology

## 2024-05-16 DIAGNOSIS — K219 Gastro-esophageal reflux disease without esophagitis: Secondary | ICD-10-CM | POA: Diagnosis not present

## 2024-05-16 DIAGNOSIS — K3189 Other diseases of stomach and duodenum: Secondary | ICD-10-CM | POA: Diagnosis not present

## 2024-05-16 DIAGNOSIS — Z7984 Long term (current) use of oral hypoglycemic drugs: Secondary | ICD-10-CM | POA: Diagnosis not present

## 2024-05-16 DIAGNOSIS — I5043 Acute on chronic combined systolic (congestive) and diastolic (congestive) heart failure: Secondary | ICD-10-CM | POA: Diagnosis not present

## 2024-05-16 DIAGNOSIS — K2100 Gastro-esophageal reflux disease with esophagitis, without bleeding: Secondary | ICD-10-CM | POA: Diagnosis not present

## 2024-05-16 DIAGNOSIS — I2510 Atherosclerotic heart disease of native coronary artery without angina pectoris: Secondary | ICD-10-CM | POA: Diagnosis not present

## 2024-05-16 DIAGNOSIS — Z87891 Personal history of nicotine dependence: Secondary | ICD-10-CM | POA: Diagnosis not present

## 2024-05-16 DIAGNOSIS — I110 Hypertensive heart disease with heart failure: Secondary | ICD-10-CM | POA: Diagnosis not present

## 2024-05-16 DIAGNOSIS — E119 Type 2 diabetes mellitus without complications: Secondary | ICD-10-CM | POA: Diagnosis not present

## 2024-05-16 DIAGNOSIS — R0789 Other chest pain: Secondary | ICD-10-CM | POA: Diagnosis not present

## 2024-05-16 DIAGNOSIS — I509 Heart failure, unspecified: Secondary | ICD-10-CM | POA: Diagnosis not present

## 2024-05-16 DIAGNOSIS — J449 Chronic obstructive pulmonary disease, unspecified: Secondary | ICD-10-CM | POA: Diagnosis not present

## 2024-05-16 DIAGNOSIS — K2950 Unspecified chronic gastritis without bleeding: Secondary | ICD-10-CM | POA: Diagnosis not present

## 2024-05-16 HISTORY — PX: ESOPHAGOGASTRODUODENOSCOPY: SHX5428

## 2024-05-16 LAB — GLUCOSE, CAPILLARY: Glucose-Capillary: 110 mg/dL — ABNORMAL HIGH (ref 70–99)

## 2024-05-16 SURGERY — EGD (ESOPHAGOGASTRODUODENOSCOPY)
Anesthesia: General

## 2024-05-16 MED ORDER — SODIUM CHLORIDE 0.9 % IV SOLN: INTRAVENOUS | Status: DC

## 2024-05-16 MED ORDER — PROPOFOL 500 MG/50ML IV EMUL
INTRAVENOUS | Status: DC | PRN
  Administered 2024-05-16: 60 mg via INTRAVENOUS

## 2024-05-16 MED ORDER — LIDOCAINE HCL (CARDIAC) PF 100 MG/5ML IV SOSY
PREFILLED_SYRINGE | INTRAVENOUS | Status: DC | PRN
  Administered 2024-05-16 (×2): 100 mg via INTRAVENOUS

## 2024-05-16 NOTE — Op Note (Signed)
 [facility] Patient Name: Sarah [name] Procedure Date: 05/16/2024 9:42 AM MRN: [id number] Account #: [id number] Date of Birth: September 03, 1950 Admit Type: Outpatient Age: 73 Room: [facility] ENDO ROOM 3 Gender: Female Note Status: Finalized Instrument Name: [facility] GI Scope [id number] Procedure:             Upper GI endoscopy Indications:           Gastro-esophageal reflux disease, Chest pain (non                         cardiac) Providers:             Ole Schick MD, MD Referring MD:          Lonell Collet (Referring MD) Medicines:             Monitored Anesthesia Care Complications:         No immediate complications. Estimated blood loss:                         Minimal. Procedure:             Pre-Anesthesia Assessment:                        - Prior to the procedure, a History and Physical was                         performed, and patient medications and allergies were                         reviewed. The patient is competent. The risks and                         benefits of the procedure and the sedation options and                         risks were discussed with the patient. All questions                         were answered and informed consent was obtained.                         Patient identification and proposed procedure were                         verified by the physician, the nurse, the                         anesthesiologist, the anesthetist and the technician                         in the endoscopy suite. Mental Status Examination:                         alert and oriented. Airway Examination: normal                         oropharyngeal airway and neck mobility. Respiratory  Examination: clear to auscultation. CV Examination:                         normal. Prophylactic Antibiotics: The patient does not                         require prophylactic antibiotics. Prior                         Anticoagulants:  The patient has taken no anticoagulant                         or antiplatelet agents. ASA Grade Assessment: III - A                         patient with severe systemic disease. After reviewing                         the risks and benefits, the patient was deemed in                         satisfactory condition to undergo the procedure. The                         anesthesia plan was to use monitored anesthesia care                         (MAC). Immediately prior to administration of                         medications, the patient was re-assessed for adequacy                         to receive sedatives. The heart rate, respiratory                         rate, oxygen saturations, blood pressure, adequacy of                         pulmonary ventilation, and response to care were                         monitored throughout the procedure. The physical                         status of the patient was re-assessed after the                         procedure.                        After obtaining informed consent, the endoscope was                         passed under direct vision. Throughout the procedure,                         the patient's blood pressure, pulse, and oxygen  saturations were monitored continuously. The Endoscope                         was introduced through the mouth, and advanced to the                         second part of duodenum. The upper GI endoscopy was                         accomplished without difficulty. The patient tolerated                         the procedure well. Findings:      The examined esophagus was normal.      The entire examined stomach was normal. Biopsies were taken with a cold       forceps for Helicobacter pylori testing. Estimated blood loss was       minimal.      The examined duodenum was normal. Impression:            - Normal esophagus.                        - Normal stomach. Biopsied.                         - Normal examined duodenum. Recommendation:        - Discharge patient to home.                        - Resume previous diet.                        - Continue present medications.                        - Await pathology results.                        - Return to referring physician as previously                         scheduled. Procedure Code(s):     --- Professional ---                        [phone], Esophagogastroduodenoscopy, flexible,                         transoral; with biopsy, single or multiple Diagnosis Code(s):     --- Professional ---                        K21.9, Gastro-esophageal reflux disease without                         esophagitis                        R07.89, Other chest pain CPT copyright 2022 American Medical Association. All rights reserved. The codes documented in this report are preliminary and upon coder review may  be revised to meet current compliance requirements. Ole Schick MD, MD 05/16/2024 10:01:32 AM Number  of Addenda: 0 Note Initiated On: 05/16/2024 9:42 AM Estimated Blood Loss:  Estimated blood loss was minimal.      [facility]

## 2024-05-16 NOTE — Interval H&P Note (Signed)
 History and Physical Interval Note:  05/16/2024 9:43 AM  Sarah [name]  has presented today for surgery, with the diagnosis of GERD,atypical chest pain.  The various methods of treatment have been discussed with the patient and family. After consideration of risks, benefits and other options for treatment, the patient has consented to  Procedure(s): EGD (ESOPHAGOGASTRODUODENOSCOPY) (N/A) as a surgical intervention.  The patient's history has been reviewed, patient examined, no change in status, stable for surgery.  I have reviewed the patient's chart and labs.  Questions were answered to the patient's satisfaction.     [name]  Ok to proceed with EGD

## 2024-05-16 NOTE — Transfer of Care (Signed)
 Immediate Anesthesia Transfer of Care Note  Patient: Sarah [name]  Procedure(s) Performed: EGD (ESOPHAGOGASTRODUODENOSCOPY)  Patient Location: PACU  Anesthesia Type:General  Level of Consciousness: awake and patient cooperative  Airway & Oxygen Therapy: Patient Spontanous Breathing  Post-op Assessment: Report given to RN and Post -op Vital signs reviewed and stable  Post vital signs: stable  Last Vitals:  Vitals Value Taken Time  BP 103/50 05/16/24 10:00  Temp    Pulse 71 05/16/24 10:00  Resp 19 05/16/24 10:00  SpO2 98 % 05/16/24 10:00  Vitals shown include unfiled device data.  Last Pain:  Vitals:   05/16/24 0846  TempSrc: Temporal         Complications: There were no known notable events for this encounter.

## 2024-05-16 NOTE — Anesthesia Procedure Notes (Signed)
 Procedure Name: MAC Date/Time: 05/16/2024 9:46 AM  Performed by: Norleen Alberta HERO., CRNAPre-anesthesia Checklist: Patient identified, Emergency Drugs available, Suction available, Patient being monitored and Timeout performed Patient Re-evaluated:Patient Re-evaluated prior to induction Oxygen Delivery Method: Nasal cannula

## 2024-05-16 NOTE — Anesthesia Preprocedure Evaluation (Addendum)
 Anesthesia Evaluation  Patient identified by MRN, date of birth, ID band Patient awake    Reviewed: Allergy & Precautions, NPO status , Patient's Chart, lab work & pertinent test results  History of Anesthesia Complications Negative for: history of anesthetic complications  Airway Mallampati: III  TM Distance: <3 FB Neck ROM: full    Dental  (+) Upper Dentures, Lower Dentures   Pulmonary shortness of breath and with exertion, COPD, former smoker   Pulmonary exam normal        Cardiovascular Exercise Tolerance: Good hypertension, (-) angina + CAD and +CHF  Normal cardiovascular exam     Neuro/Psych negative neurological ROS  negative psych ROS   GI/Hepatic Neg liver ROS,GERD  Controlled,,  Endo/Other  diabetes, Type 2    Renal/GU negative Renal ROS  negative genitourinary   Musculoskeletal   Abdominal   Peds  Hematology negative hematology ROS (+)   Anesthesia Other Findings Past Medical History: No date: Arthritis No date: Carotid artery disease No date: Congestive heart failure (HCC) No date: COPD (chronic obstructive pulmonary disease) (HCC) No date: Diabetes mellitus without complication (HCC)     Comment:  pt denies, however is on metformin daily No date: Dyspnea No date: HBP (high blood pressure) No date: Heart murmur No date: History of kidney stones  Past Surgical History: 06/16/1989: ABDOMINAL HYSTERECTOMY 06/16/1997: CARDIAC CATHETERIZATION No date: CATARACT EXTRACTION W/ INTRAOCULAR LENS  IMPLANT, BILATERAL No date: COLONOSCOPY 12/09/2021: COLONOSCOPY WITH PROPOFOL ; N/A     Comment:  Procedure: COLONOSCOPY WITH PROPOFOL ;  Surgeon: Jinny Carmine, MD;  Location: [facility];  Service:               Endoscopy;  Laterality: N/A; 10/17/2022: CYSTOSCOPY/URETEROSCOPY/HOLMIUM LASER/STENT PLACEMENT; Right     Comment:  Procedure: CYSTOSCOPY/RIGHT RETROGRADE PYELOGRAM/RIGHT                URETEROSCOPY/HOLMIUM LASER/RIGHT STENT PLACEMENT;                Surgeon: Selma Donnice SAUNDERS, MD;  Location: [facility];  Service:              Urology;  Laterality: Right;  60 MINUTES NEEDED FOR CASE 02/11/2016: ESOPHAGEAL DILATION; N/A     Comment:  Procedure: ESOPHAGEAL DILATION;  Surgeon: Carmine Jinny,               MD;  Location: [facility];  Service: Endoscopy;               Laterality: N/A; 02/11/2016: ESOPHAGOGASTRODUODENOSCOPY (EGD) WITH PROPOFOL ; N/A     Comment:  Procedure: ESOPHAGOGASTRODUODENOSCOPY (EGD) WITH               PROPOFOL ;  Surgeon: Carmine Jinny, MD;  Location: [facility]               S[facility];  Service: Endoscopy;  Laterality: N/A;                DIABETIC     Reproductive/Obstetrics negative OB ROS                              Anesthesia Physical Anesthesia Plan  ASA: 3  Anesthesia Plan: General   Post-op Pain Management:    Induction: Intravenous  PONV Risk Score and Plan: Propofol  infusion and TIVA  Airway Management Planned: Natural Airway and Nasal Cannula  Additional Equipment:   Intra-op Plan:   Post-operative Plan:   Informed Consent: I have reviewed the patients History and Physical, chart, labs and discussed the procedure including the risks, benefits and alternatives for the proposed anesthesia with the patient or authorized representative who has indicated his/her understanding and acceptance.     Dental Advisory Given  Plan Discussed with: Anesthesiologist, CRNA and Surgeon  Anesthesia Plan Comments: (Patient consented for risks of anesthesia including but not limited to:  - adverse reactions to medications - risk of airway placement if required - damage to eyes, teeth, lips or other oral mucosa - nerve damage due to positioning  - sore throat or hoarseness - Damage to heart, brain, nerves, lungs, other parts of body or loss of life  Patient voiced understanding and assent.)          Anesthesia Quick Evaluation

## 2024-05-16 NOTE — Anesthesia Postprocedure Evaluation (Signed)
 Anesthesia Post Note  Patient: Sarah [name]  Procedure(s) Performed: EGD (ESOPHAGOGASTRODUODENOSCOPY)  Patient location during evaluation: Endoscopy Anesthesia Type: General Level of consciousness: awake and alert Pain management: pain level controlled Vital Signs Assessment: post-procedure vital signs reviewed and stable Respiratory status: spontaneous breathing, nonlabored ventilation and respiratory function stable Cardiovascular status: blood pressure returned to baseline and stable Postop Assessment: no apparent nausea or vomiting Anesthetic complications: no   There were no known notable events for this encounter.   Last Vitals:  Vitals:   05/16/24 1010 05/16/24 1020  BP: 107/65 118/66  Pulse: 73 71  Resp: 16 20  Temp:    SpO2: 99% 100%    Last Pain:  Vitals:   05/16/24 1020  TempSrc:   PainSc: 0-No pain                 Fairy POUR Zaakirah Kistner

## 2024-05-16 NOTE — H&P (Signed)
 Outpatient short stay form Pre-procedure 05/16/2024  Sarah ONEIDA Schick, MD  Primary Physician: Gerome Brunet, DO  Reason for visit:  Atypical chest pain  History of present illness:    73 y/o lady with history of hypertension, HLD, and obesity here for EGD for atypical chest pain. No blood thinners. No family history of GI malignancies. No neck surgeries.    Current Facility-Administered Medications:    0.9 %  sodium chloride  infusion, , Intravenous, Continuous, Thekla Colborn, Sarah ONEIDA, MD, Last Rate: 20 mL/hr at 05/16/24 0900, New Bag at 05/16/24 0900  Medications Prior to Admission  Medication Sig Dispense Refill Last Dose/Taking   albuterol  (VENTOLIN  HFA) 108 (90 Base) MCG/ACT inhaler Inhale 1-2 puffs into the lungs every 6 (six) hours as needed for wheezing or shortness of breath.   05/16/2024 Morning   aspirin  81 MG tablet Take 81 mg by mouth daily.   05/15/2024   carvedilol  (COREG ) 12.5 MG tablet Take 12.5 mg by mouth 2 (two) times daily with a meal.   05/16/2024 Morning   diltiazem  (CARDIZEM  CD) 120 MG 24 hr capsule Take 1 capsule (120 mg total) by mouth daily. 30 capsule 0 05/16/2024 Morning   empagliflozin  (JARDIANCE ) 10 MG TABS tablet TAKE 1 TABLET BY MOUTH EVERY DAY BEFORE BREAKFAST 30 tablet 11 05/16/2024 Morning   furosemide  (LASIX ) 40 MG tablet Take 20 mg by mouth daily.   Past Week   simvastatin  (ZOCOR ) 40 MG tablet Take 40 mg by mouth at bedtime.   Past Week   spironolactone  (ALDACTONE ) 25 MG tablet Take 0.5 tablets (12.5 mg total) by mouth daily. 30 tablet 0 05/15/2024   Tiotropium Bromide  (SPIRIVA  RESPIMAT) 2.5 MCG/ACT AERS INHALE 2 PUFFS INTO THE LUNGS DAILY IN THE AFTERNOON. 4 g 11 05/16/2024 Morning   valsartan (DIOVAN) 80 MG tablet Take 40 mg by mouth daily.   05/16/2024 Morning   acetaminophen  (TYLENOL ) 500 MG tablet Take 500 mg by mouth every 6 (six) hours as needed for moderate pain.      ascorbic acid  (VITAMIN C) 1000 MG tablet Take 1,000 mg by mouth daily.       baclofen  (LIORESAL ) 10 MG tablet Take 1 tablet (10 mg total) by mouth 3 (three) times daily. (Patient not taking: Reported on 11/19/2023) 30 each 0    Cholecalciferol (VITAMIN D) 50 MCG (2000 UT) CAPS Take 2,000 Units by mouth daily.      isosorbide  mononitrate (IMDUR ) 60 MG 24 hr tablet Take 1 tablet (60 mg total) by mouth in the morning and at bedtime. 60 tablet 0    Menthol-Methyl Salicylate (SALONPAS PAIN RELIEF PATCH) PTCH Place 1 patch onto the skin daily as needed (pain).      Multiple Vitamins-Minerals (CENTRUM SILVER PO) Take 1 tablet by mouth daily.      predniSONE  (STERAPRED UNI-PAK 21 TAB) 10 MG (21) TBPK tablet Take 6 tablets on day 1, 5 tablets day 2, 4 tablets day 3, 3 tablets day 4, 2 tablets day 5, 1 tablet day 6 (Patient not taking: Reported on 11/19/2023) 21 tablet 0    ranolazine  (RANEXA ) 500 MG 12 hr tablet Take 2 tablets (1,000 mg total) by mouth 2 (two) times daily. 120 tablet 0    tiotropium (SPIRIVA ) 18 MCG inhalation capsule Place 18 mcg into inhaler and inhale daily.        No Known Allergies   Past Medical History:  Diagnosis Date   Arthritis    Carotid artery disease    Congestive heart failure (HCC)  COPD (chronic obstructive pulmonary disease) (HCC)    Diabetes mellitus without complication (HCC)    pt denies, however is on metformin daily   Dyspnea    HBP (high blood pressure)    Heart murmur    History of kidney stones     Review of systems:  Otherwise negative.    Physical Exam  Gen: Alert, oriented. Appears stated age.  HEENT: PERRLA. Lungs: No respiratory distress CV: RRR Abd: soft, benign, no masses Ext: No edema    Planned procedures: Proceed with EGD. The patient understands the nature of the planned procedure, indications, risks, alternatives and potential complications including but not limited to bleeding, infection, perforation, damage to internal organs and possible oversedation/side effects from anesthesia. The patient agrees and  gives consent to proceed.  Please refer to procedure notes for findings, recommendations and patient disposition/instructions.     Sarah ONEIDA Schick, MD [facility]

## 2024-05-17 DIAGNOSIS — F4321 Adjustment disorder with depressed mood: Secondary | ICD-10-CM | POA: Diagnosis not present

## 2024-05-18 LAB — SURGICAL PATHOLOGY

## 2024-05-27 ENCOUNTER — Encounter: Payer: Self-pay | Admitting: Pulmonary Disease

## 2024-05-27 ENCOUNTER — Ambulatory Visit: Admitting: Pulmonary Disease

## 2024-05-27 VITALS — BP 102/66 | HR 73 | Temp 97.9°F | Ht 63.0 in | Wt 238.0 lb

## 2024-05-27 DIAGNOSIS — I5042 Chronic combined systolic (congestive) and diastolic (congestive) heart failure: Secondary | ICD-10-CM

## 2024-05-27 DIAGNOSIS — Z87891 Personal history of nicotine dependence: Secondary | ICD-10-CM

## 2024-05-27 DIAGNOSIS — R0683 Snoring: Secondary | ICD-10-CM | POA: Diagnosis not present

## 2024-05-27 DIAGNOSIS — G4730 Sleep apnea, unspecified: Secondary | ICD-10-CM

## 2024-05-27 DIAGNOSIS — J449 Chronic obstructive pulmonary disease, unspecified: Secondary | ICD-10-CM

## 2024-05-27 NOTE — Patient Instructions (Signed)
 VISIT SUMMARY:  Today, we discussed your ongoing COPD management, recent shingles, hip bursitis, and potential sleep apnea. We reviewed your medications and symptoms, and made plans for further evaluation and management.  YOUR PLAN:  -CHRONIC OBSTRUCTIVE PULMONARY DISEASE (COPD): COPD is a chronic lung condition that makes it hard to breathe. Your breathing is stable with Spiriva  Respimat, but you have a persistent nighttime cough that might be related to reflux. Continue using Spiriva  Respimat as prescribed.  Your last pulmonary function test was performed in May and it was normal.  This is reassuring.  -SUSPECTED SLEEP APNEA: Sleep apnea is a condition where breathing repeatedly stops and starts during sleep, often causing snoring and daytime fatigue. Given your significant snoring and daytime exhaustion, we recommend a home sleep study to evaluate for sleep apnea.  Previously you had shown mild sleep apnea but we need to check and make sure that this has not worsened.  INSTRUCTIONS:  Please continue using Spiriva  Respimat as prescribed. We have ordered a lung function test to check your current lung health if it hasn't been done recently. Additionally, we recommend a home sleep study to evaluate for sleep apnea. Follow up with us  after completing these tests to discuss the results and next steps.

## 2024-05-27 NOTE — Progress Notes (Signed)
 Subjective:    Patient ID: Sarah [name], female    DOB: [birth year], 73 y.o.   MRN: [id number]  Patient Care Team: Gerome Brunet, DO as PCP - General (Family Medicine) Weyman Bright, MD as Consulting Physician (Internal Medicine) Dessa, Reyes ORN, MD (General Surgery) Tamea Dedra CROME, MD as Consulting Physician (Pulmonary Disease)  Chief Complaint  Patient presents with   COPD    Dry cough, mainly at night. Occasional shortness of breath on exertion.     BACKGROUND/INTERVAL: 73 year old female, former smoker (13.5 PY), who follows here for the issue of moderate COPD and dyspnea on exertion.  This is a scheduled visit.  Patient was last seen by me on 19 November 2023.  Continues to be on Spiriva  2.5 mcg, 2 inhalations daily. She has not had any COPD exacerbations nor hospital admissions since her last visit.  She also has combined systolic and diastolic heart failure and follows up with cardiology in this regard.  No decompensation of this issue.  PFTs performed 12 Nov 2023 were essentially normal.  She had sleep study performed 07 April 2023 that showed very mild OSA with AHI of 6.7/h, she is not on CPAP.   HPI Discussed the use of AI scribe software for clinical note transcription with the patient, who gave verbal consent to proceed.  History of Present Illness   Sarah [name] is a 73 year old female with mild COPD (stage I) who presents for follow-up on COPD and cough.  She experiences a persistent dry cough, particularly at night, without any other associated respiratory symptoms. She uses Spiriva  Respimat effectively and manages the cough by drinking water . Her husband reports she does snoring, which sometimes requires repositioning during sleep.  She recently had shingles with lesions on her knee and another area, which are now drying up. She describes significant pain associated with the shingles.  She has a history of hip bursitis, for which she received two injections in  August. The bursitis has flared up again, but she has not pursued further injections due to the pain from shingles.  She has experienced chest pain in the past, which led to a cardiac evaluation that was normal. She associates some of the chest pain with stress and exhaustion following her mother's passing during Thanksgiving. The chest pain, described as heaviness or discomfort in the esophagus area, was managed with pantoprazole and Pepcid , along with deep breathing exercises. She has not experienced significant chest pain since the day of her mother's burial.  She is currently taking pantoprazole, prescribed by her primary care provider, to manage symptoms that may be related to reflux.   She has developed worsening sleep symptoms with loud snoring and nonrestorative sleep.    DATA: 04/29/2016 PFTs: FEV1 1.59 L or 82% predicted, FVC 2.39 L or 98% predicted, FEV1/FVC 67%, diffusion capacity 70%.  Consistent with mild obstruction, concomitant restriction likely due to obesity. 03/17/2019 CT chest: No parenchymal abnormalities, specifically no lung nodule.  No emphysema. 04/08/2021 chest x-ray PA and lateral: Diffuse right lung airspace disease consistent with multifocal pneumonia. 04/09/2021 echocardiogram: LVEF 45 to 50% with mild decreased function in the LV.  No wall motion abnormalities.  Diastolic dysfunction grade 1.  Mild mitral regurgitation. 05/08/2021 chest x-ray PA lateral: No acute cardiopulmonary disease, pneumonia resolved. 06/25/2021 PFTs: FEV1 1.78 L or 100% predicted, FVC 2.37 L or 103% of predicted.  FEV1/FVC 75% lung volumes normal, no bronchodilator response diffusion capacity 82%, low end of normal.  Compared to PFTs performed November  2017 there has been improvement. 10/09/2022 echocardiogram: LVEF 60 to 65%, grade 1 diastolic dysfunction, normal right ventricular function.  Mild mitral valve regurgitation, no aortic stenosis present. 04/07/2023 Home sleep study: Very mild  sleep apnea with minimal to no oxygen saturations during sleep.  AHI of 6.7/h.  Being treated with lifestyle modifications. 11/12/2023 PFTs: FEV1 2.0 L or 93% predicted, FVC 2.44 L or 85% predicted, FEV1/FVC 82%, lung volumes are normal, diffusion capacity normal, no bronchodilator response.  Normal study.  Review of Systems A 10 point review of systems was performed and it is as noted above otherwise negative.   Patient Active Problem List   Diagnosis Date Noted   Colon cancer screening    Impaired fasting glucose    Acute pain of right shoulder    Acute on chronic combined systolic and diastolic CHF (congestive heart failure) (HCC) 04/08/2021   Diabetes mellitus without complication (HCC)    COPD (chronic obstructive pulmonary disease) (HCC)    HLD (hyperlipidemia)    CAD (coronary artery disease)    Chest pain    Osteoarthritis of knee 07/09/2017   Patellofemoral stress syndrome 07/09/2017   Venous insufficiency of both lower extremities 02/11/2017   Chronic diastolic CHF (congestive heart failure), NYHA class 2 (HCC) 05/12/2016   Controlled type 2 diabetes mellitus without complication, without long-term current use of insulin (HCC) 05/12/2016   Essential hypertension 04/04/2016   Mixed hyperlipidemia 04/04/2016   Other diseases of stomach and duodenum    Problems with swallowing and mastication    Left buttock abscess 12/15/2014   DOE (dyspnea on exertion) 06/05/2014   COPD type A (HCC) 06/05/2014   Morbid obesity with BMI of 40.0-44.9, adult (HCC) 06/05/2014    Social History   Tobacco Use   Smoking status: Former    Current packs/day: 0.00    Average packs/day: 0.5 packs/day for 27.0 years (13.5 ttl pk-yrs)    Types: Cigarettes    Start date: 01/14/1969    Quit date: 01/15/1996    Years since quitting: 28.3   Smokeless tobacco: Never  Substance Use Topics   Alcohol use: Not Currently    Alcohol/week: 2.0 - 3.0 standard drinks of alcohol    Types: 1 - 2 Glasses of  wine, 1 Cans of beer per week    Comment: WINE WEEKLY    Allergies[1]  Active Medications[2]  Immunization History  Administered Date(s) Administered   Fluad Quad(high Dose 65+) 05/08/2021, 04/11/2022   INFLUENZA, HIGH DOSE SEASONAL PF 04/09/2018, 04/16/2023   Influenza, Quadrivalent, Recombinant, Inj, Pf 04/18/2019, 04/30/2020   Influenza-Unspecified 03/02/2014, 04/18/2019   Moderna Sars-Covid-2 Vaccination 07/07/2019, 07/29/2019, 08/29/2019   PNEUMOCOCCAL CONJUGATE-20 01/30/2021   PPD Test 08/19/2019, 09/21/2020, 09/09/2021, 09/19/2022, 10/27/2023   Pneumococcal Polysaccharide-23 05/17/2015        Objective:     Vitals:   05/27/24 1109  BP: 102/66  Pulse: 73  Temp: 97.9 F (36.6 C)  Height: 5' 3 (1.6 m)  Weight: 238 lb (108 kg)  SpO2: 97%  TempSrc: Temporal  BMI (Calculated): 42.17     SpO2: 97 %  GENERAL: Obese woman, no acute distress, fully ambulatory.  No conversational dyspnea. HEAD: Normocephalic, atraumatic.  EYES: Pupils equal, round, reactive to light.  No scleral icterus.  MOUTH: Dentition is full.  Oral mucosa moist.  No thrush. NECK: Supple. No thyromegaly. Trachea midline. No JVD.  No adenopathy. PULMONARY: Good air entry bilaterally.  No adventitious sounds. CARDIOVASCULAR: S1 and S2. Regular rate and rhythm.  No rubs,  murmurs or gallops heard. ABDOMEN: Obese otherwise benign. MUSCULOSKELETAL: No joint deformity, no clubbing, no edema.  NEUROLOGIC: No overt focal deficit, no gait disturbance, speech is fluent. SKIN: Intact,warm,dry. PSYCH: Mood and behavior normal.     03/19/2023    3:00 PM 05/05/2016    9:00 AM  Results of the Epworth flowsheet  Sitting and reading 1 1  Watching TV 3 3  Sitting, inactive in a public place (e.g. a theatre or a meeting) 1 0  As a passenger in a car for an hour without a break 1 1  Lying down to rest in the afternoon when circumstances permit 3 3  Sitting and talking to someone 1 0  Sitting quietly after a  lunch without alcohol 2 0  In a car, while stopped for a few minutes in traffic 0 0  Total score 12 8    Assessment & Plan:     ICD-10-CM   1. Stage 1 mild COPD by GOLD classification (HCC)  J44.9     2. Chronic combined systolic and diastolic CHF, NYHA class 2 (HCC)  I50.42     3. Loud snoring  R06.83 Home sleep test    4. Sleep apnea in adult - very mild  G47.30 Home sleep test      Orders Placed This Encounter  Procedures   Home sleep test    Standing Status:   Future    Expected Date:   06/10/2024    Expiration Date:   05/27/2025    Where should this test be performed::   [facility] - [facility]   Discussion:    Chronic obstructive pulmonary disease Mild COPD with well-managed breathing. Occasional nighttime cough, possibly related to reflux. Spiriva  Respimat is effective. Recent chest pain episodes resolved with pantoprazole and Pepcid , likely related to esophageal discomfort rather than cardiac issues. - Continue Spiriva  Respimat - Continue as needed albuterol   Suspected worsening sleep apnea Reports significant snoring and daytime exhaustion, potentially contributing to overall fatigue. Husband reports frequent snoring. Sleep study recommended to re-evaluate for worsening sleep apnea (previously mild/very mild). - Ordered home sleep study   Follow-up in 6 weeks time.  Advised if symptoms do not improve or worsen, to please contact office for sooner follow up or seek emergency care.    I spent 30 minutes of dedicated to the care of this patient on the date of this encounter to include pre-visit review of records, face-to-face time with the patient discussing conditions above, post visit ordering of testing, clinical documentation with the electronic health record, making appropriate referrals as documented, and communicating necessary findings to members of the patients care team.     C. Leita Sanders, MD [facility]    *This note was generated using voice recognition software/Dragon and/or AI transcription program.  Despite best efforts to proofread, errors can occur which can change the meaning. Any transcriptional errors that result from this process are unintentional and may not be fully corrected at the time of dictation.     [1] No Known Allergies [2]  Current Meds  Medication Sig   acetaminophen  (TYLENOL ) 500 MG tablet Take 500 mg by mouth every 6 (six) hours as needed for moderate pain.   albuterol  (VENTOLIN  HFA) 108 (90 Base) MCG/ACT inhaler Inhale 1-2 puffs into the lungs every 6 (six) hours as needed for wheezing or shortness of breath.   ascorbic acid  (VITAMIN C) 1000 MG tablet Take 1,000 mg by mouth daily.   aspirin  81  MG tablet Take 81 mg by mouth daily.   carvedilol  (COREG ) 12.5 MG tablet Take 12.5 mg by mouth 2 (two) times daily with a meal.   Cholecalciferol (VITAMIN D) 50 MCG (2000 UT) CAPS Take 2,000 Units by mouth daily.   empagliflozin  (JARDIANCE ) 10 MG TABS tablet TAKE 1 TABLET BY MOUTH EVERY DAY BEFORE BREAKFAST   furosemide  (LASIX ) 40 MG tablet Take 20 mg by mouth daily.   isosorbide  mononitrate (IMDUR ) 60 MG 24 hr tablet Take 1 tablet (60 mg total) by mouth in the morning and at bedtime. (Patient taking differently: Take 60 mg by mouth at bedtime.)   Menthol-Methyl Salicylate (SALONPAS PAIN RELIEF PATCH) PTCH Place 1 patch onto the skin daily as needed (pain).   Multiple Vitamins-Minerals (CENTRUM SILVER PO) Take 1 tablet by mouth daily.   oxyCODONE -acetaminophen  (PERCOCET) 10-325 MG tablet Take 1 tablet by mouth every 6 (six) hours as needed.   ranolazine  (RANEXA ) 500 MG 12 hr tablet Take 2 tablets (1,000 mg total) by mouth 2 (two) times daily.   simvastatin  (ZOCOR ) 40 MG tablet Take 40 mg by mouth at bedtime.   spironolactone  (ALDACTONE ) 25 MG tablet Take 0.5 tablets (12.5 mg total) by mouth daily.   Tiotropium Bromide  (SPIRIVA  RESPIMAT) 2.5 MCG/ACT AERS  INHALE 2 PUFFS INTO THE LUNGS DAILY IN THE AFTERNOON.   valACYclovir  (VALTREX ) 1000 MG tablet Take 1,000 mg by mouth 3 (three) times daily.   valsartan (DIOVAN) 80 MG tablet Take 40 mg by mouth daily.

## 2024-06-05 ENCOUNTER — Ambulatory Visit: Admission: EM | Admit: 2024-06-05 | Discharge: 2024-06-05

## 2024-06-05 DIAGNOSIS — W19XXXA Unspecified fall, initial encounter: Secondary | ICD-10-CM | POA: Diagnosis not present

## 2024-06-05 DIAGNOSIS — S0990XA Unspecified injury of head, initial encounter: Secondary | ICD-10-CM | POA: Diagnosis not present

## 2024-06-05 DIAGNOSIS — S0993XA Unspecified injury of face, initial encounter: Secondary | ICD-10-CM

## 2024-06-05 DIAGNOSIS — T07XXXA Unspecified multiple injuries, initial encounter: Secondary | ICD-10-CM | POA: Diagnosis not present

## 2024-06-05 NOTE — Discharge Instructions (Signed)
-   Proceed to the ER for CT scan of your face and head  You have been advised to follow up immediately in the emergency department for concerning signs.symptoms. If you declined EMS transport, please have a family member take you directly to the ED at this time. Do not delay. Based on concerns about condition, if you do not follow up in th e ED, you may risk poor outcomes including worsening of condition, delayed treatment and potentially life threatening issues. If you have declined to go to the ED at this time, you should call your PCP immediately to set up a follow up appointment.  Go to ED for red flag symptoms, including; fevers you cannot reduce with Tylenol /Motrin , severe headaches, vision changes, numbness/weakness in part of the body, lethargy, confusion, intractable vomiting, severe dehydration, chest pain, breathing difficulty, severe persistent abdominal or pelvic pain, signs of severe infection (increased redness, swelling of an area), feeling faint or passing out, dizziness, etc. You should especially go to the ED for sudden acute worsening of condition if you do not elect to go at this time.

## 2024-06-05 NOTE — ED Triage Notes (Addendum)
 Sarah [name] states she fell on her face on the side walk at restaurant around 2pm. Left side of face hurts, jaw, scratched face up and have a headache. No treatment used.

## 2024-06-05 NOTE — ED Triage Notes (Addendum)
 Pt had a fall around 1400 outside today face first, takes 81 mg of Aspirin  daily denies LOC, selling to left side of face, abrasion below nose, nasal pain, pain left side of face, left eye red and lip pain. Urgent care sent pt for CT head

## 2024-06-05 NOTE — ED Provider Notes (Signed)
 " [facility]    CSN: [id number] Arrival date & time: 06/05/24  1459      History   Chief Complaint No chief complaint on file.   HPI Sarah [name] is a 73 y.o. female with her husband for evaluation after fall and subsequent injury that occurred about 2 hours ago.  She reports coming out of a restaurant around 2 PM today.  States she fell onto her face and her head and face hit the pavement.  Denies loss of consciousness.  She has abrasion under her nose, swelling of her left cheek, redness of the left eye and a headache.  She reports her facial pain is about a 9 out of 10 and her headache is 7 out of 10.  She has not taken anything for pain relief.  She denies weakness or tingling.  She has had a little bit of numbness to the left side of her face.  No difficulty walking or speaking.  She is not taking any anticoagulants.  HPI  Past Medical History:  Diagnosis Date   Arthritis    Carotid artery disease    Congestive heart failure (HCC)    COPD (chronic obstructive pulmonary disease) (HCC)    Diabetes mellitus without complication (HCC)    pt denies, however is on metformin daily   Dyspnea    HBP (high blood pressure)    Heart murmur    History of kidney stones     Patient Active Problem List   Diagnosis Date Noted   Colon cancer screening    Impaired fasting glucose    Acute pain of right shoulder    Acute on chronic combined systolic and diastolic CHF (congestive heart failure) (HCC) 04/08/2021   Diabetes mellitus without complication (HCC)    COPD (chronic obstructive pulmonary disease) (HCC)    HLD (hyperlipidemia)    CAD (coronary artery disease)    Chest pain    Osteoarthritis of knee 07/09/2017   Patellofemoral stress syndrome 07/09/2017   Venous insufficiency of both lower extremities 02/11/2017   Chronic diastolic CHF (congestive heart failure), NYHA class 2 (HCC) 05/12/2016   Controlled type 2 diabetes mellitus without complication, without  long-term current use of insulin (HCC) 05/12/2016   Essential hypertension 04/04/2016   Mixed hyperlipidemia 04/04/2016   Other diseases of stomach and duodenum    Problems with swallowing and mastication    Left buttock abscess 12/15/2014   DOE (dyspnea on exertion) 06/05/2014   COPD type A (HCC) 06/05/2014   Morbid obesity with BMI of 40.0-44.9, adult (HCC) 06/05/2014    Past Surgical History:  Procedure Laterality Date   ABDOMINAL HYSTERECTOMY  06/16/1989   CARDIAC CATHETERIZATION  06/16/1997   CATARACT EXTRACTION W/ INTRAOCULAR LENS  IMPLANT, BILATERAL     COLONOSCOPY     COLONOSCOPY WITH PROPOFOL  N/A 12/09/2021   Procedure: COLONOSCOPY WITH PROPOFOL ;  Surgeon: Jinny Carmine, MD;  Location: [facility];  Service: Endoscopy;  Laterality: N/A;   CYSTOSCOPY/URETEROSCOPY/HOLMIUM LASER/STENT PLACEMENT Right 10/17/2022   Procedure: CYSTOSCOPY/RIGHT RETROGRADE PYELOGRAM/RIGHT URETEROSCOPY/HOLMIUM LASER/RIGHT STENT PLACEMENT;  Surgeon: Selma Donnice SAUNDERS, MD;  Location: [facility];  Service: Urology;  Laterality: Right;  60 MINUTES NEEDED FOR CASE   ESOPHAGEAL DILATION N/A 02/11/2016   Procedure: ESOPHAGEAL DILATION;  Surgeon: Carmine Jinny, MD;  Location: [facility];  Service: Endoscopy;  Laterality: N/A;   ESOPHAGOGASTRODUODENOSCOPY N/A 05/16/2024   Procedure: EGD (ESOPHAGOGASTRODUODENOSCOPY);  Surgeon: Maryruth Ole DASEN, MD;  Location: [facility];  Service: Endoscopy;  Laterality: N/A;  ESOPHAGOGASTRODUODENOSCOPY (EGD) WITH PROPOFOL  N/A 02/11/2016   Procedure: ESOPHAGOGASTRODUODENOSCOPY (EGD) WITH PROPOFOL ;  Surgeon: Rogelia Copping, MD;  Location: [facility];  Service: Endoscopy;  Laterality: N/A;  DIABETIC    OB History     Gravida  3   Para  3   Term      Preterm      AB      Living         SAB      IAB      Ectopic      Multiple      Live Births           Obstetric Comments  1st Menstrual Cycle:  13  1st Pregnancy:  17          Home  Medications    Prior to Admission medications  Medication Sig Start Date End Date Taking? Authorizing Provider  acetaminophen  (TYLENOL ) 500 MG tablet Take 500 mg by mouth every 6 (six) hours as needed for moderate pain.    [provider]  albuterol  (VENTOLIN  HFA) 108 (90 Base) MCG/ACT inhaler Inhale 1-2 puffs into the lungs every 6 (six) hours as needed for wheezing or shortness of breath.    [provider]  ascorbic acid  (VITAMIN C) 1000 MG tablet Take 1,000 mg by mouth daily.    [provider]  aspirin  81 MG tablet Take 81 mg by mouth daily.    [provider]  baclofen  (LIORESAL ) 10 MG tablet Take 1 tablet (10 mg total) by mouth 3 (three) times daily. Patient not taking: Reported on 05/27/2024 10/30/23   Bernardino Ditch, NP  carvedilol  (COREG ) 12.5 MG tablet Take 12.5 mg by mouth 2 (two) times daily with a meal.    [provider]  Cholecalciferol (VITAMIN D) 50 MCG (2000 UT) CAPS Take 2,000 Units by mouth daily.    [provider]  diltiazem  (CARDIZEM  CD) 120 MG 24 hr capsule Take 1 capsule (120 mg total) by mouth daily. Patient not taking: Reported on 05/27/2024 11/08/23 11/07/24  Malvina Alm DASEN, MD  empagliflozin  (JARDIANCE ) 10 MG TABS tablet TAKE 1 TABLET BY MOUTH EVERY DAY BEFORE BREAKFAST 02/11/24   Donette City A, FNP  furosemide  (LASIX ) 40 MG tablet Take 20 mg by mouth daily.    [provider]  isosorbide  mononitrate (IMDUR ) 60 MG 24 hr tablet Take 1 tablet (60 mg total) by mouth in the morning and at bedtime. Patient taking differently: Take 60 mg by mouth at bedtime. 11/08/23 05/27/24  Malvina Alm DASEN, MD  Menthol-Methyl Salicylate (SALONPAS PAIN RELIEF PATCH) PTCH Place 1 patch onto the skin daily as needed (pain).    [provider]  Multiple Vitamins-Minerals (CENTRUM SILVER PO) Take 1 tablet by mouth daily.    [provider]  oxyCODONE -acetaminophen  (PERCOCET) 10-325 MG tablet Take 1 tablet by mouth  every 6 (six) hours as needed. 05/17/24   [provider]  ranolazine  (RANEXA ) 500 MG 12 hr tablet Take 2 tablets (1,000 mg total) by mouth 2 (two) times daily. 11/08/23   Malvina Alm DASEN, MD  simvastatin  (ZOCOR ) 40 MG tablet Take 40 mg by mouth at bedtime.    [provider]  spironolactone  (ALDACTONE ) 25 MG tablet Take 0.5 tablets (12.5 mg total) by mouth daily. 04/12/21   Josette Ade, MD  Tiotropium Bromide  (SPIRIVA  RESPIMAT) 2.5 MCG/ACT AERS INHALE 2 PUFFS INTO THE LUNGS DAILY IN THE AFTERNOON. 04/04/24   Tamea Dedra CROME, MD  valACYclovir  (VALTREX ) 1000  MG tablet Take 1,000 mg by mouth 3 (three) times daily. 05/17/24   [provider]  valsartan (DIOVAN) 80 MG tablet Take 40 mg by mouth daily.    [provider]    Family History Family History  Problem Relation Age of Onset   Stroke Father    Hypertension Father    Hypertension Mother    Stroke Mother    Breast cancer Cousin        2 mat cousins   Breast cancer Cousin        pat cousin    Social History Social History[1]   Allergies   Patient has no known allergies.   Review of Systems Review of Systems  Constitutional:  Negative for fatigue.  HENT:  Positive for facial swelling.   Eyes:  Positive for pain and redness. Negative for photophobia and visual disturbance.  Cardiovascular:  Negative for chest pain and palpitations.  Gastrointestinal:  Negative for abdominal pain, nausea and vomiting.  Musculoskeletal:  Negative for back pain and neck pain.  Skin:  Positive for color change and wound.  Neurological:  Positive for numbness and headaches. Negative for dizziness, syncope, facial asymmetry, weakness and light-headedness.     Physical Exam Triage Vital Signs ED Triage Vitals  Encounter Vitals Group     BP      Girls Systolic BP Percentile      Girls Diastolic BP Percentile      Boys Systolic BP Percentile      Boys Diastolic BP Percentile      Pulse      Resp       Temp      Temp src      SpO2      Weight      Height      Head Circumference      Peak Flow      Pain Score      Pain Loc      Pain Education      Exclude from Growth Chart    No data found.  Updated Vital Signs BP 117/70 (BP Location: Left Arm)   Pulse 81   Temp 97.7 F (36.5 C) (Oral)   Resp 20   Ht 5' 3 (1.6 m)   Wt 235 lb (106.6 kg)   SpO2 97%   BMI 41.63 kg/m      Physical Exam Vitals and nursing note reviewed.  Constitutional:      General: She is not in acute distress.    Appearance: Normal appearance. She is not ill-appearing or toxic-appearing.  HENT:     Right Ear: Tympanic membrane, ear canal and external ear normal.     Left Ear: Tympanic membrane, ear canal and external ear normal.     Ears:     Comments: Mild to moderate swelling of left cheek with tenderness over the maxillary region.  Also has tenderness along the orbit of the left eye.    Nose: Nose normal.     Comments: Superficial abrasions around nose.  No nosebleed.    Mouth/Throat:     Mouth: Mucous membranes are moist.     Pharynx: Oropharynx is clear.  Eyes:     General: No scleral icterus.       Right eye: No discharge.        Left eye: No discharge.     Extraocular Movements: Extraocular movements intact.     Conjunctiva/sclera:     Right eye: Right conjunctiva is  not injected.     Left eye: Left conjunctiva is injected.     Pupils: Pupils are equal, round, and reactive to light.  Cardiovascular:     Rate and Rhythm: Normal rate and regular rhythm.     Heart sounds: Normal heart sounds.  Pulmonary:     Effort: Pulmonary effort is normal. No respiratory distress.     Breath sounds: Normal breath sounds.  Musculoskeletal:     Cervical back: Neck supple.  Skin:    General: Skin is dry.  Neurological:     General: No focal deficit present.     Mental Status: She is alert. Mental status is at baseline.     Motor: No weakness.     Gait: Gait normal.  Psychiatric:        Mood and  Affect: Mood normal.        Behavior: Behavior normal.      UC Treatments / Results  Labs (all labs ordered are listed, but only abnormal results are displayed) Labs Reviewed - No data to display  EKG   Radiology No results found.  Procedures Procedures (including critical care time)  Medications Ordered in UC Medications - No data to display  Initial Impression / Assessment and Plan / UC Course  I have reviewed the triage vital signs and the nursing notes.  Pertinent labs & imaging results that were available during my care of the patient were reviewed by me and considered in my medical decision making (see chart for details).   73 year old female presents for facial pain and headache following accidental fall that occurred a couple hours ago.  She presents with her husband today who drove her to the urgent care.  She is not taking any anticoagulants.  On exam, patient is in no acute distress.  She is alert and oriented.  She has redness of the left eye, left maxillary facial swelling and pain and abrasions on face.  Normal neuroexam.  Chest clear.  Heart regular rate and rhythm.  Advised ED evaluation for CT scan of face and head.  Patient agrees.  Plans to go to [facility] [place].  Her husband is taking her.  Declines EMS.  Acute injury with potential threat to life and bodily harm.  Final Clinical Impressions(s) / UC Diagnoses   Final diagnoses:  Facial injury, initial encounter  Injury of head, initial encounter  Accidental fall, initial encounter  Multiple abrasions     Discharge Instructions      - Proceed to the ER for CT scan of your face and head  You have been advised to follow up immediately in the emergency department for concerning signs.symptoms. If you declined EMS transport, please have a family member take you directly to the ED at this time. Do not delay. Based on concerns about condition, if you do not follow up in th e ED, you may risk poor  outcomes including worsening of condition, delayed treatment and potentially life threatening issues. If you have declined to go to the ED at this time, you should call your PCP immediately to set up a follow up appointment.  Go to ED for red flag symptoms, including; fevers you cannot reduce with Tylenol /Motrin , severe headaches, vision changes, numbness/weakness in part of the body, lethargy, confusion, intractable vomiting, severe dehydration, chest pain, breathing difficulty, severe persistent abdominal or pelvic pain, signs of severe infection (increased redness, swelling of an area), feeling faint or passing out, dizziness, etc. You should especially go to  the ED for sudden acute worsening of condition if you do not elect to go at this time.      ED Prescriptions   None    PDMP not reviewed this encounter.     [1]  Social History Tobacco Use   Smoking status: Former    Current packs/day: 0.00    Average packs/day: 0.5 packs/day for 27.0 years (13.5 ttl pk-yrs)    Types: Cigarettes    Start date: 01/14/1969    Quit date: 01/15/1996    Years since quitting: 28.4   Smokeless tobacco: Never  Vaping Use   Vaping status: Never Used  Substance Use Topics   Alcohol use: Not Currently    Alcohol/week: 2.0 - 3.0 standard drinks of alcohol    Types: 1 - 2 Glasses of wine, 1 Cans of beer per week    Comment: WINE WEEKLY   Drug use: No     Arvis Jolan NOVAK, PA-C 06/08/24 [id number]  "

## 2024-06-10 DIAGNOSIS — R0683 Snoring: Secondary | ICD-10-CM

## 2024-06-10 DIAGNOSIS — G4730 Sleep apnea, unspecified: Secondary | ICD-10-CM

## 2024-06-20 DIAGNOSIS — R0683 Snoring: Secondary | ICD-10-CM | POA: Diagnosis not present

## 2024-06-21 ENCOUNTER — Ambulatory Visit: Payer: Self-pay | Admitting: Pulmonary Disease

## 2024-06-21 ENCOUNTER — Telehealth: Payer: Self-pay

## 2024-06-21 NOTE — Telephone Encounter (Signed)
 Inocente called from [facility] to verify whether the patient had a procedure performed and to request records. She was informed that she would need to contact [facility] for medical records. It was advised that the patient did have a procedure performed on 05/16/2024. Inocente was transferred to [facility], and the [facility] contact number [phone]) was provided prior to the transfer.

## 2024-06-22 NOTE — Telephone Encounter (Signed)
 Inocente called again this morning stating that [facility] informed her that Sarah [name] was our patient and that she needed to speak with our office. I advised her that Sarah [name] was seen at [facility] on 04/25/2024 and that [facility] performed a procedure on 05/16/2024. Inocente inquired about Dr. Jinny, and I explained that Dr. Jinny is now a hospitalist who only sees inpatients at the hospital and performs procedures. Since the patient was seen and treated at [facility] [place], she is established with [facility], and all visits and follow-up must go through them.

## 2024-07-11 ENCOUNTER — Ambulatory Visit: Admitting: Pulmonary Disease

## 2024-07-13 ENCOUNTER — Other Ambulatory Visit
Admission: RE | Admit: 2024-07-13 | Discharge: 2024-07-13 | Disposition: A | Discharge status: Home / Self Care | Source: Ambulatory Visit | Attending: Cardiology | Admitting: Cardiology

## 2024-07-13 DIAGNOSIS — I2510 Atherosclerotic heart disease of native coronary artery without angina pectoris: Secondary | ICD-10-CM | POA: Insufficient documentation

## 2024-07-13 DIAGNOSIS — R0789 Other chest pain: Secondary | ICD-10-CM | POA: Diagnosis present

## 2024-07-13 DIAGNOSIS — I2584 Coronary atherosclerosis due to calcified coronary lesion: Secondary | ICD-10-CM | POA: Diagnosis present

## 2024-07-13 LAB — TROPONIN T, HIGH SENSITIVITY: Troponin T High Sensitivity: 9 ng/L (ref 0–19)

## 2024-07-15 ENCOUNTER — Ambulatory Visit: Admitting: Pulmonary Disease

## 2024-07-15 ENCOUNTER — Encounter: Payer: Self-pay | Admitting: Pulmonary Disease

## 2024-07-15 VITALS — BP 96/66 | HR 76 | Temp 97.9°F | Ht 63.0 in | Wt 233.6 lb

## 2024-07-15 DIAGNOSIS — G4733 Obstructive sleep apnea (adult) (pediatric): Secondary | ICD-10-CM

## 2024-07-15 DIAGNOSIS — J449 Chronic obstructive pulmonary disease, unspecified: Secondary | ICD-10-CM

## 2024-07-15 DIAGNOSIS — K219 Gastro-esophageal reflux disease without esophagitis: Secondary | ICD-10-CM

## 2024-07-15 DIAGNOSIS — R0789 Other chest pain: Secondary | ICD-10-CM

## 2024-07-15 DIAGNOSIS — Z87891 Personal history of nicotine dependence: Secondary | ICD-10-CM

## 2024-07-15 DIAGNOSIS — G4730 Sleep apnea, unspecified: Secondary | ICD-10-CM

## 2024-07-15 NOTE — Patient Instructions (Signed)
 VISIT SUMMARY:  During your visit, we discussed your ongoing issues with sleep apnea, chest pain, and reflux. We reviewed your current medications and made some recommendations to help manage your symptoms.  YOUR PLAN:  -OBSTRUCTIVE SLEEP APNEA: Obstructive sleep apnea is a condition where your breathing stops and starts repeatedly during sleep. We recommend using a CPAP machine to help improve your breathing at night, reduce stress on your heart, and alleviate daytime fatigue and shortness of breath. Medicare should cover the cost of the CPAP machine. We have arranged for a home health agency to set up the CPAP machine for you and provided education on its use and benefits.  -GASTROESOPHAGEAL REFLUX DISEASE (GERD): GERD is a condition where stomach acid frequently flows back into the tube connecting your mouth and stomach, causing irritation. You are currently managing this with pantoprazole and Pepcid  AC. We recommend continuing these medications. The gurgling sounds you hear at night may be related to esophageal spasms, which can be alleviated with nitroglycerin .  INSTRUCTIONS:  Please follow up with the home health agency for the CPAP machine setup. Continue taking pantoprazole and Pepcid  AC as needed for reflux. If you experience any new or worsening symptoms, please contact our office.

## 2024-10-14 ENCOUNTER — Ambulatory Visit: Admitting: Pulmonary Disease
